# Patient Record
Sex: Female | Born: 1945
Health system: Southern US, Community
[De-identification: ages and names within clinical notes are randomized; demographics above are authoritative.]

## PROBLEM LIST (undated history)

## (undated) ENCOUNTER — Ambulatory Visit (HOSPITAL_COMMUNITY): Payer: Medicare Other | Source: Home / Self Care

## (undated) DIAGNOSIS — E7800 Pure hypercholesterolemia, unspecified: Secondary | ICD-10-CM

## (undated) DIAGNOSIS — T4145XA Adverse effect of unspecified anesthetic, initial encounter: Secondary | ICD-10-CM

## (undated) DIAGNOSIS — F329 Major depressive disorder, single episode, unspecified: Secondary | ICD-10-CM

## (undated) DIAGNOSIS — G4730 Sleep apnea, unspecified: Secondary | ICD-10-CM

## (undated) DIAGNOSIS — M1990 Unspecified osteoarthritis, unspecified site: Secondary | ICD-10-CM

## (undated) DIAGNOSIS — F32A Depression, unspecified: Secondary | ICD-10-CM

## (undated) DIAGNOSIS — R011 Cardiac murmur, unspecified: Secondary | ICD-10-CM

## (undated) DIAGNOSIS — K219 Gastro-esophageal reflux disease without esophagitis: Secondary | ICD-10-CM

## (undated) DIAGNOSIS — E119 Type 2 diabetes mellitus without complications: Secondary | ICD-10-CM

## (undated) DIAGNOSIS — I10 Essential (primary) hypertension: Secondary | ICD-10-CM

## (undated) DIAGNOSIS — F419 Anxiety disorder, unspecified: Secondary | ICD-10-CM

## (undated) DIAGNOSIS — I739 Peripheral vascular disease, unspecified: Secondary | ICD-10-CM

## (undated) HISTORY — PX: REDUCTION MAMMAPLASTY: SUR839

## (undated) HISTORY — PX: TUBAL LIGATION: SHX77

## (undated) HISTORY — PX: BUNIONECTOMY: SHX129

## (undated) HISTORY — PX: FOOT SURGERY: SHX648

---

## 1991-05-03 DIAGNOSIS — T8859XA Other complications of anesthesia, initial encounter: Secondary | ICD-10-CM

## 1991-05-03 HISTORY — DX: Other complications of anesthesia, initial encounter: T88.59XA

## 1998-02-02 ENCOUNTER — Ambulatory Visit (HOSPITAL_COMMUNITY): Admission: AD | Admit: 1998-02-02 | Discharge: 1998-02-02 | Payer: Self-pay | Admitting: Family Medicine

## 1998-02-12 ENCOUNTER — Ambulatory Visit (HOSPITAL_COMMUNITY): Admission: RE | Admit: 1998-02-12 | Discharge: 1998-02-12 | Payer: Self-pay | Admitting: Family Medicine

## 1998-05-28 ENCOUNTER — Other Ambulatory Visit: Admission: RE | Admit: 1998-05-28 | Discharge: 1998-05-28 | Payer: Self-pay | Admitting: Internal Medicine

## 1998-06-10 ENCOUNTER — Encounter: Payer: Self-pay | Admitting: Family Medicine

## 1998-06-10 ENCOUNTER — Ambulatory Visit (HOSPITAL_COMMUNITY): Admission: RE | Admit: 1998-06-10 | Discharge: 1998-06-10 | Payer: Self-pay | Admitting: Obstetrics & Gynecology

## 1998-08-31 ENCOUNTER — Other Ambulatory Visit: Admission: RE | Admit: 1998-08-31 | Discharge: 1998-08-31 | Payer: Self-pay | Admitting: Obstetrics and Gynecology

## 1999-06-07 ENCOUNTER — Encounter: Admission: RE | Admit: 1999-06-07 | Discharge: 1999-06-07 | Payer: Self-pay | Admitting: Family Medicine

## 1999-06-22 ENCOUNTER — Encounter: Payer: Self-pay | Admitting: Family Medicine

## 1999-06-22 ENCOUNTER — Ambulatory Visit (HOSPITAL_COMMUNITY): Admission: RE | Admit: 1999-06-22 | Discharge: 1999-06-22 | Payer: Self-pay | Admitting: Family Medicine

## 1999-09-21 ENCOUNTER — Other Ambulatory Visit: Admission: RE | Admit: 1999-09-21 | Discharge: 1999-09-21 | Payer: Self-pay | Admitting: Obstetrics and Gynecology

## 2000-05-04 ENCOUNTER — Encounter: Payer: Self-pay | Admitting: Obstetrics and Gynecology

## 2000-05-04 ENCOUNTER — Ambulatory Visit (HOSPITAL_COMMUNITY): Admission: RE | Admit: 2000-05-04 | Discharge: 2000-05-04 | Payer: Self-pay | Admitting: Obstetrics and Gynecology

## 2000-06-07 ENCOUNTER — Other Ambulatory Visit: Admission: RE | Admit: 2000-06-07 | Discharge: 2000-06-07 | Payer: Self-pay | Admitting: Plastic Surgery

## 2000-06-07 ENCOUNTER — Encounter (INDEPENDENT_AMBULATORY_CARE_PROVIDER_SITE_OTHER): Payer: Self-pay | Admitting: Specialist

## 2000-11-29 ENCOUNTER — Other Ambulatory Visit: Admission: RE | Admit: 2000-11-29 | Discharge: 2000-11-29 | Payer: Self-pay | Admitting: Obstetrics and Gynecology

## 2001-09-21 ENCOUNTER — Encounter: Payer: Self-pay | Admitting: Obstetrics and Gynecology

## 2001-09-21 ENCOUNTER — Ambulatory Visit (HOSPITAL_COMMUNITY): Admission: RE | Admit: 2001-09-21 | Discharge: 2001-09-21 | Payer: Self-pay | Admitting: Obstetrics and Gynecology

## 2002-01-01 ENCOUNTER — Other Ambulatory Visit: Admission: RE | Admit: 2002-01-01 | Discharge: 2002-01-01 | Payer: Self-pay | Admitting: Obstetrics and Gynecology

## 2002-09-23 ENCOUNTER — Ambulatory Visit (HOSPITAL_COMMUNITY): Admission: RE | Admit: 2002-09-23 | Discharge: 2002-09-23 | Payer: Self-pay | Admitting: Obstetrics and Gynecology

## 2002-09-23 ENCOUNTER — Encounter: Payer: Self-pay | Admitting: Obstetrics and Gynecology

## 2003-12-01 ENCOUNTER — Ambulatory Visit (HOSPITAL_COMMUNITY): Admission: RE | Admit: 2003-12-01 | Discharge: 2003-12-01 | Payer: Self-pay | Admitting: Family Medicine

## 2004-06-17 ENCOUNTER — Other Ambulatory Visit: Admission: RE | Admit: 2004-06-17 | Discharge: 2004-06-17 | Payer: Self-pay | Admitting: Obstetrics and Gynecology

## 2005-02-17 ENCOUNTER — Encounter: Admission: RE | Admit: 2005-02-17 | Discharge: 2005-02-17 | Payer: Self-pay | Admitting: Family Medicine

## 2005-06-08 ENCOUNTER — Other Ambulatory Visit: Admission: RE | Admit: 2005-06-08 | Discharge: 2005-06-08 | Payer: Self-pay | Admitting: Obstetrics and Gynecology

## 2005-08-31 ENCOUNTER — Encounter: Payer: Self-pay | Admitting: Family Medicine

## 2006-04-03 ENCOUNTER — Encounter: Admission: RE | Admit: 2006-04-03 | Discharge: 2006-04-03 | Payer: Self-pay | Admitting: Family Medicine

## 2006-05-12 ENCOUNTER — Ambulatory Visit: Payer: Self-pay | Admitting: Internal Medicine

## 2006-05-18 ENCOUNTER — Encounter (INDEPENDENT_AMBULATORY_CARE_PROVIDER_SITE_OTHER): Payer: Self-pay | Admitting: *Deleted

## 2006-05-18 ENCOUNTER — Ambulatory Visit: Payer: Self-pay | Admitting: Internal Medicine

## 2006-06-20 ENCOUNTER — Ambulatory Visit: Payer: Self-pay | Admitting: Internal Medicine

## 2007-04-11 ENCOUNTER — Encounter: Admission: RE | Admit: 2007-04-11 | Discharge: 2007-04-11 | Payer: Self-pay | Admitting: Family

## 2008-04-14 ENCOUNTER — Encounter: Admission: RE | Admit: 2008-04-14 | Discharge: 2008-04-14 | Payer: Self-pay | Admitting: Family Medicine

## 2009-05-18 ENCOUNTER — Encounter: Admission: RE | Admit: 2009-05-18 | Discharge: 2009-05-18 | Payer: Self-pay | Admitting: Family Medicine

## 2010-09-17 NOTE — Assessment & Plan Note (Signed)
HEALTHCARE                         GASTROENTEROLOGY OFFICE NOTE   REESE, SENK                       MRN:          [id number]  DATE:05/12/2006                            DOB:          1946-04-25    Stacey Santana is a very nice 65 year old white female who we saw in the  past for gastroesophageal reflux, esophageal stricture and colorectal  screening, and evaluation of diarrhea.  She is here today because of  diarrhea and leakage of stool.  She has been fully evaluated in [facility] in Urogynecology and diagnosed with pelvic floor dyssynergia.  She  also was diagnosed on the anal manometer to have anorectal muscle  separation, probably traumatic from the fourth-degree laceration during  her delivery.  She is currently undergoing physical therapy for pelvic  floor exercises and strengthening muscles.  She was fully evaluated by  Dr. Bernette Redbird and advised to undergo colonoscopy to rule out  any structural problems in her colon.  Her last colonoscopy in January  2000 showed minimal diverticulosis of the left colon, no polyps.  There  is no family history of colon cancer.  Her stools are mostly loose.  She  denies constipation.  She has mild incontinence with leakage of the  stool at times.  She had several episodes of rectal bleeding.   MEDICATIONS:  1. Avandamet 06/998 half tablet p.o. daily.  2. Zoloft 150 mg p.o. daily.  3. Toprol XL 25 mg p.o. daily.  4. Vesicare 5 mg p.o. daily.  5. Vitamin C.  6. Calcium.  7. Multivitamins.  8. Depakote 1000 mg p.o. nightly.  9. Lisinopril 40 mg p.o. daily.  10.Omeprazole 20 mg p.o. daily.   PAST MEDICAL HISTORY:  Significant for high blood pressure, diabetes,  hyperlipidemia, depression, allergies.   PAST SURGICAL HISTORY:  Breast surgery reduction.   FAMILY HISTORY:  Negative for colon cancer.  Diabetes in mother.   SOCIAL HISTORY:  Married with two children, college education.  She  is  an Charity fundraiser, works in Research officer, trade union.  She drinks socially, does not smoke.   REVIEW OF SYSTEMS:  Weight has been stable.  Eyeglasses.  Allergies.  Leakage of stool as described in history of present illness.   PHYSICAL EXAMINATION:  Blood pressure 126/80, pulse 74, weight 208  pounds.  She was alert, oriented, in no distress, very cooperative.  HEENT:  Sclerae is nonicteric.  Oral cavity was normal.  NECK:  Supple, no adenopathy.  LUNGS:  Clear to auscultation.  COR:  Quiet precordium, normal S1, normal S2.  ABDOMEN:  Soft, relaxed, with normoactive bowel sounds.  No tympany, no  distention, liver edge at the costal margin.  RECTAL:  Exam shows a normal perianal area.  Rectal tone was actually  adequate, perhaps a little relaxed, but she had a squeeze around my  finger.  Rectal ampulla felt normal.  There was some moderate amount of  semi-solid stool, which was heme-negative.   IMPRESSION:  A 65 year old white female with history of low-grade  diarrhea attributed to irritable bowel syndrome, known minimal  diverticulosis of the left colon  as per last colonoscopy 8 years ago.  She has new findings of pelvic dyssynergia and leakage of stool.  She  has also had several episodes of rectal bleeding.  Although her initial  interval for recall colonoscopy was 10 years, we will proceed with  colonoscopy at this time to rule out any structural lesions that might  have occurred since last colonoscopy.   PLAN:  1. We will schedule colonoscopy, routine colonoscopy prep.  2. Begin Levbid 0.375 mg as an antispasmodic.  3. Increase fiber in her diet.  4. No need for repeat upper endoscopy at this time, since the patient      remains asymptomatic.     Stacey Morton. [name], MD  Electronically Signed    DMB/MedQ  DD: 05/12/2006  DT: 05/13/2006  Job #: [id number]   cc:   Bernette Redbird, MD  Solon Palm, D.O.  Hal Morales, M.D.

## 2010-09-17 NOTE — Assessment & Plan Note (Signed)
[name]                         GASTROENTEROLOGY OFFICE NOTE   [name], [name]                       MRN:          [id number]  DATE:06/20/2006                            DOB:          05/06/1945    Stacey Santana is a 65 year old white female who has been evaluated for  urgency and diarrhea.  She had a colonoscopy on May 18, 2006 with  random biopsies of the colon to rule out collagenous colitis but her  biopsies were negative for microscopic colitis.  She has some  diverticulosis of the left colon and was advised to take psyllium, 1  tablespoon daily and Levbid 0.375 mg twice a day, which she has taken  and it is about 60% to 70% improved.  She is not having as much diarrhea  or urgency.  She has occasional gastroesophageal reflux for which she  takes omeprazole 20 mg a day.   Other medications:  1. Avandamet 06/998 half tablets p.o. b.i.d.  2. Zoloft 150 mg p.o. daily.  3. Toprol XL 25 mg p.o. daily.  4. Vesicare 5 mg p.o. daily.  5. Vitamin C.  6. Calcium.  7. Multiple vitamins.  8. Depakote 1000 mg p.o. nightly.  9. Lisinopril 40 mg p.o. nightly.  10.Omeprazole 20 mg p.o. daily.  11.Levbid 0.375 mg p.o. daily.  12.Lipitor 10 mg p.o. daily.   PHYSICAL EXAMINATION:  Blood pressure 118/80, pulse 72 and weight 208  pounds.  The patient was not examined.   IMPRESSION:  33. A 65 year old white female with irritable bowel syndrome/diarrhea      currently under reasonable control on anti-spasmodic.  2. Mild diverticulosis of the left colon.   PLAN:  1. Continue Levbid.  Prescription given for 90 day supply.  2. Continue high fiber diet.  3. Add psyllium 1 tablespoon daily.  4. I will see her on an p.r.n. basis.     [name]. [name], MD  Electronically Signed    DMB/MedQ  DD: 06/20/2006  DT: 06/21/2006  Job #: [id number]   cc:   Ms. Rema Fendt, FNP

## 2010-11-05 ENCOUNTER — Ambulatory Visit: Payer: BC Managed Care – PPO | Admitting: Physical Therapy

## 2010-11-05 ENCOUNTER — Ambulatory Visit: Payer: BC Managed Care – PPO | Attending: Family Medicine | Admitting: Physical Therapy

## 2010-11-05 DIAGNOSIS — M25519 Pain in unspecified shoulder: Secondary | ICD-10-CM | POA: Insufficient documentation

## 2010-11-05 DIAGNOSIS — IMO0001 Reserved for inherently not codable concepts without codable children: Secondary | ICD-10-CM | POA: Insufficient documentation

## 2010-11-05 DIAGNOSIS — R5381 Other malaise: Secondary | ICD-10-CM | POA: Insufficient documentation

## 2010-11-08 ENCOUNTER — Ambulatory Visit: Payer: BC Managed Care – PPO | Admitting: Physical Therapy

## 2010-11-10 ENCOUNTER — Ambulatory Visit: Payer: BC Managed Care – PPO | Admitting: Physical Therapy

## 2010-11-12 ENCOUNTER — Encounter: Payer: BC Managed Care – PPO | Admitting: Physical Therapy

## 2010-11-22 ENCOUNTER — Ambulatory Visit: Payer: BC Managed Care – PPO | Admitting: Physical Therapy

## 2010-11-24 ENCOUNTER — Ambulatory Visit: Payer: BC Managed Care – PPO | Admitting: Physical Therapy

## 2010-11-26 ENCOUNTER — Ambulatory Visit: Payer: BC Managed Care – PPO | Admitting: Physical Therapy

## 2010-11-29 ENCOUNTER — Ambulatory Visit: Payer: BC Managed Care – PPO

## 2010-12-03 ENCOUNTER — Ambulatory Visit: Payer: BC Managed Care – PPO | Attending: Family Medicine | Admitting: Physical Therapy

## 2010-12-03 DIAGNOSIS — R5381 Other malaise: Secondary | ICD-10-CM | POA: Insufficient documentation

## 2010-12-03 DIAGNOSIS — IMO0001 Reserved for inherently not codable concepts without codable children: Secondary | ICD-10-CM | POA: Insufficient documentation

## 2010-12-03 DIAGNOSIS — M25519 Pain in unspecified shoulder: Secondary | ICD-10-CM | POA: Insufficient documentation

## 2010-12-06 ENCOUNTER — Ambulatory Visit: Payer: BC Managed Care – PPO | Admitting: Physical Therapy

## 2010-12-08 ENCOUNTER — Encounter: Payer: BC Managed Care – PPO | Admitting: Physical Therapy

## 2010-12-13 ENCOUNTER — Ambulatory Visit: Payer: BC Managed Care – PPO

## 2010-12-15 ENCOUNTER — Ambulatory Visit: Payer: BC Managed Care – PPO

## 2010-12-16 ENCOUNTER — Encounter: Payer: BC Managed Care – PPO | Admitting: Physical Therapy

## 2010-12-30 ENCOUNTER — Encounter: Payer: BC Managed Care – PPO | Admitting: Physical Therapy

## 2011-10-14 ENCOUNTER — Other Ambulatory Visit: Payer: Self-pay | Admitting: Family Medicine

## 2011-10-14 DIAGNOSIS — Z1231 Encounter for screening mammogram for malignant neoplasm of breast: Secondary | ICD-10-CM

## 2011-11-04 ENCOUNTER — Ambulatory Visit: Payer: BC Managed Care – PPO

## 2011-11-11 ENCOUNTER — Ambulatory Visit
Admission: RE | Admit: 2011-11-11 | Discharge: 2011-11-11 | Disposition: A | Discharge status: Home / Self Care | Payer: BC Managed Care – PPO | Source: Ambulatory Visit | Attending: Family Medicine | Admitting: Family Medicine

## 2011-11-11 DIAGNOSIS — Z1231 Encounter for screening mammogram for malignant neoplasm of breast: Secondary | ICD-10-CM

## 2011-11-28 ENCOUNTER — Encounter: Payer: Self-pay | Admitting: Obstetrics and Gynecology

## 2012-06-19 ENCOUNTER — Ambulatory Visit: Payer: BC Managed Care – PPO | Attending: Family Medicine

## 2012-06-19 DIAGNOSIS — M25559 Pain in unspecified hip: Secondary | ICD-10-CM | POA: Insufficient documentation

## 2012-06-19 DIAGNOSIS — IMO0001 Reserved for inherently not codable concepts without codable children: Secondary | ICD-10-CM | POA: Insufficient documentation

## 2012-06-19 DIAGNOSIS — R269 Unspecified abnormalities of gait and mobility: Secondary | ICD-10-CM | POA: Insufficient documentation

## 2012-06-19 DIAGNOSIS — R5381 Other malaise: Secondary | ICD-10-CM | POA: Insufficient documentation

## 2012-06-21 ENCOUNTER — Ambulatory Visit: Payer: BC Managed Care – PPO | Admitting: Physical Therapy

## 2012-06-26 ENCOUNTER — Ambulatory Visit: Payer: BC Managed Care – PPO | Admitting: Physical Therapy

## 2012-06-28 ENCOUNTER — Ambulatory Visit: Payer: BC Managed Care – PPO

## 2012-07-02 ENCOUNTER — Ambulatory Visit: Payer: BC Managed Care – PPO | Attending: Family Medicine

## 2012-07-02 DIAGNOSIS — R5381 Other malaise: Secondary | ICD-10-CM | POA: Insufficient documentation

## 2012-07-02 DIAGNOSIS — R269 Unspecified abnormalities of gait and mobility: Secondary | ICD-10-CM | POA: Insufficient documentation

## 2012-07-02 DIAGNOSIS — IMO0001 Reserved for inherently not codable concepts without codable children: Secondary | ICD-10-CM | POA: Insufficient documentation

## 2012-07-02 DIAGNOSIS — M25559 Pain in unspecified hip: Secondary | ICD-10-CM | POA: Insufficient documentation

## 2012-07-10 ENCOUNTER — Ambulatory Visit: Payer: BC Managed Care – PPO | Admitting: Physical Therapy

## 2012-07-13 ENCOUNTER — Ambulatory Visit: Payer: BC Managed Care – PPO | Admitting: Physical Therapy

## 2012-07-17 ENCOUNTER — Ambulatory Visit: Payer: BC Managed Care – PPO | Admitting: Physical Therapy

## 2012-07-20 ENCOUNTER — Ambulatory Visit: Payer: BC Managed Care – PPO

## 2012-07-24 ENCOUNTER — Ambulatory Visit: Payer: BC Managed Care – PPO | Admitting: Physical Therapy

## 2012-07-27 ENCOUNTER — Ambulatory Visit: Payer: BC Managed Care – PPO | Admitting: Physical Therapy

## 2012-07-31 ENCOUNTER — Ambulatory Visit: Payer: BC Managed Care – PPO | Attending: Family Medicine

## 2012-07-31 DIAGNOSIS — R269 Unspecified abnormalities of gait and mobility: Secondary | ICD-10-CM | POA: Insufficient documentation

## 2012-07-31 DIAGNOSIS — R5381 Other malaise: Secondary | ICD-10-CM | POA: Insufficient documentation

## 2012-07-31 DIAGNOSIS — IMO0001 Reserved for inherently not codable concepts without codable children: Secondary | ICD-10-CM | POA: Insufficient documentation

## 2012-07-31 DIAGNOSIS — M25559 Pain in unspecified hip: Secondary | ICD-10-CM | POA: Insufficient documentation

## 2012-08-03 ENCOUNTER — Ambulatory Visit: Payer: BC Managed Care – PPO | Admitting: Physical Therapy

## 2012-08-07 ENCOUNTER — Ambulatory Visit: Payer: BC Managed Care – PPO | Admitting: Physical Therapy

## 2012-08-10 ENCOUNTER — Ambulatory Visit: Payer: BC Managed Care – PPO | Admitting: Physical Therapy

## 2013-01-14 ENCOUNTER — Other Ambulatory Visit: Payer: Self-pay

## 2013-01-14 DIAGNOSIS — Z1231 Encounter for screening mammogram for malignant neoplasm of breast: Secondary | ICD-10-CM

## 2013-01-31 ENCOUNTER — Ambulatory Visit
Admission: RE | Admit: 2013-01-31 | Discharge: 2013-01-31 | Disposition: A | Discharge status: Home / Self Care | Payer: BC Managed Care – PPO | Source: Ambulatory Visit

## 2013-01-31 DIAGNOSIS — Z1231 Encounter for screening mammogram for malignant neoplasm of breast: Secondary | ICD-10-CM

## 2013-10-09 ENCOUNTER — Other Ambulatory Visit: Payer: Self-pay | Admitting: Urology

## 2013-10-09 DIAGNOSIS — N281 Cyst of kidney, acquired: Secondary | ICD-10-CM

## 2013-10-17 ENCOUNTER — Ambulatory Visit (HOSPITAL_COMMUNITY)
Admission: RE | Admit: 2013-10-17 | Discharge: 2013-10-17 | Disposition: A | Discharge status: Home / Self Care | Payer: BC Managed Care – PPO | Source: Ambulatory Visit | Attending: Urology | Admitting: Urology

## 2013-10-17 DIAGNOSIS — N281 Cyst of kidney, acquired: Secondary | ICD-10-CM | POA: Insufficient documentation

## 2013-10-17 DIAGNOSIS — N289 Disorder of kidney and ureter, unspecified: Secondary | ICD-10-CM | POA: Insufficient documentation

## 2013-10-17 MED ORDER — GADOBENATE DIMEGLUMINE 529 MG/ML IV SOLN
18.0000 mL | Freq: Once | INTRAVENOUS | Status: AC | PRN
  Administered 2013-10-17: 18 mL via INTRAVENOUS

## 2014-04-30 ENCOUNTER — Other Ambulatory Visit: Payer: Self-pay

## 2014-04-30 DIAGNOSIS — Z1231 Encounter for screening mammogram for malignant neoplasm of breast: Secondary | ICD-10-CM

## 2014-05-26 ENCOUNTER — Ambulatory Visit: Payer: BC Managed Care – PPO

## 2014-06-02 ENCOUNTER — Ambulatory Visit
Admission: RE | Admit: 2014-06-02 | Discharge: 2014-06-02 | Disposition: A | Discharge status: Home / Self Care | Payer: BC Managed Care – PPO | Source: Ambulatory Visit

## 2014-06-02 DIAGNOSIS — Z1231 Encounter for screening mammogram for malignant neoplasm of breast: Secondary | ICD-10-CM

## 2014-09-03 ENCOUNTER — Other Ambulatory Visit (HOSPITAL_COMMUNITY): Payer: Self-pay | Admitting: Urology

## 2014-09-03 DIAGNOSIS — N281 Cyst of kidney, acquired: Secondary | ICD-10-CM

## 2014-10-10 ENCOUNTER — Ambulatory Visit (HOSPITAL_COMMUNITY)
Admission: RE | Admit: 2014-10-10 | Discharge: 2014-10-10 | Disposition: A | Discharge status: Home / Self Care | Payer: BC Managed Care – PPO | Source: Ambulatory Visit | Attending: Urology | Admitting: Urology

## 2014-10-10 DIAGNOSIS — N289 Disorder of kidney and ureter, unspecified: Secondary | ICD-10-CM | POA: Diagnosis not present

## 2014-10-10 DIAGNOSIS — K760 Fatty (change of) liver, not elsewhere classified: Secondary | ICD-10-CM | POA: Diagnosis not present

## 2014-10-10 DIAGNOSIS — N281 Cyst of kidney, acquired: Secondary | ICD-10-CM

## 2014-10-10 LAB — POCT I-STAT CREATININE: Creatinine, Ser: 0.8 mg/dL (ref 0.44–1.00)

## 2014-10-10 MED ORDER — GADOBENATE DIMEGLUMINE 529 MG/ML IV SOLN
19.0000 mL | Freq: Once | INTRAVENOUS | Status: AC | PRN
  Administered 2014-10-10: 19 mL via INTRAVENOUS

## 2015-06-18 ENCOUNTER — Other Ambulatory Visit: Payer: Self-pay | Admitting: Family Medicine

## 2015-06-18 ENCOUNTER — Other Ambulatory Visit (HOSPITAL_COMMUNITY)
Admission: RE | Admit: 2015-06-18 | Discharge: 2015-06-18 | Disposition: A | Discharge status: Home / Self Care | Payer: Medicare Other | Source: Ambulatory Visit | Attending: Family Medicine | Admitting: Family Medicine

## 2015-06-18 DIAGNOSIS — Z124 Encounter for screening for malignant neoplasm of cervix: Secondary | ICD-10-CM | POA: Diagnosis not present

## 2015-06-18 DIAGNOSIS — M85852 Other specified disorders of bone density and structure, left thigh: Secondary | ICD-10-CM | POA: Diagnosis not present

## 2015-06-18 DIAGNOSIS — Z1211 Encounter for screening for malignant neoplasm of colon: Secondary | ICD-10-CM | POA: Diagnosis not present

## 2015-06-18 DIAGNOSIS — Z23 Encounter for immunization: Secondary | ICD-10-CM | POA: Diagnosis not present

## 2015-06-18 DIAGNOSIS — Z0000 Encounter for general adult medical examination without abnormal findings: Secondary | ICD-10-CM | POA: Diagnosis not present

## 2015-06-18 DIAGNOSIS — L309 Dermatitis, unspecified: Secondary | ICD-10-CM | POA: Diagnosis not present

## 2015-06-18 DIAGNOSIS — Z7189 Other specified counseling: Secondary | ICD-10-CM | POA: Diagnosis not present

## 2015-06-22 LAB — CYTOLOGY - PAP

## 2015-06-24 DIAGNOSIS — E785 Hyperlipidemia, unspecified: Secondary | ICD-10-CM | POA: Diagnosis not present

## 2015-06-24 DIAGNOSIS — F339 Major depressive disorder, recurrent, unspecified: Secondary | ICD-10-CM | POA: Diagnosis not present

## 2015-06-24 DIAGNOSIS — M85852 Other specified disorders of bone density and structure, left thigh: Secondary | ICD-10-CM | POA: Diagnosis not present

## 2015-06-24 DIAGNOSIS — Z7984 Long term (current) use of oral hypoglycemic drugs: Secondary | ICD-10-CM | POA: Diagnosis not present

## 2015-06-24 DIAGNOSIS — Z1211 Encounter for screening for malignant neoplasm of colon: Secondary | ICD-10-CM | POA: Diagnosis not present

## 2015-06-24 DIAGNOSIS — Z0000 Encounter for general adult medical examination without abnormal findings: Secondary | ICD-10-CM | POA: Diagnosis not present

## 2015-06-24 DIAGNOSIS — I10 Essential (primary) hypertension: Secondary | ICD-10-CM | POA: Diagnosis not present

## 2015-06-24 DIAGNOSIS — E119 Type 2 diabetes mellitus without complications: Secondary | ICD-10-CM | POA: Diagnosis not present

## 2015-06-24 DIAGNOSIS — N3941 Urge incontinence: Secondary | ICD-10-CM | POA: Diagnosis not present

## 2015-06-26 DIAGNOSIS — L309 Dermatitis, unspecified: Secondary | ICD-10-CM | POA: Diagnosis not present

## 2015-06-26 DIAGNOSIS — M2190 Unspecified acquired deformity of unspecified limb: Secondary | ICD-10-CM | POA: Diagnosis not present

## 2015-06-26 DIAGNOSIS — Z7984 Long term (current) use of oral hypoglycemic drugs: Secondary | ICD-10-CM | POA: Diagnosis not present

## 2015-06-26 DIAGNOSIS — F339 Major depressive disorder, recurrent, unspecified: Secondary | ICD-10-CM | POA: Diagnosis not present

## 2015-06-26 DIAGNOSIS — G4733 Obstructive sleep apnea (adult) (pediatric): Secondary | ICD-10-CM | POA: Diagnosis not present

## 2015-06-26 DIAGNOSIS — I10 Essential (primary) hypertension: Secondary | ICD-10-CM | POA: Diagnosis not present

## 2015-06-26 DIAGNOSIS — N3941 Urge incontinence: Secondary | ICD-10-CM | POA: Diagnosis not present

## 2015-06-26 DIAGNOSIS — K219 Gastro-esophageal reflux disease without esophagitis: Secondary | ICD-10-CM | POA: Diagnosis not present

## 2015-06-26 DIAGNOSIS — E785 Hyperlipidemia, unspecified: Secondary | ICD-10-CM | POA: Diagnosis not present

## 2015-06-26 DIAGNOSIS — E119 Type 2 diabetes mellitus without complications: Secondary | ICD-10-CM | POA: Diagnosis not present

## 2015-07-03 ENCOUNTER — Other Ambulatory Visit: Payer: Self-pay

## 2015-07-03 DIAGNOSIS — Z1231 Encounter for screening mammogram for malignant neoplasm of breast: Secondary | ICD-10-CM

## 2015-07-28 DIAGNOSIS — Z1211 Encounter for screening for malignant neoplasm of colon: Secondary | ICD-10-CM | POA: Diagnosis not present

## 2015-07-29 DIAGNOSIS — M8589 Other specified disorders of bone density and structure, multiple sites: Secondary | ICD-10-CM | POA: Diagnosis not present

## 2015-07-29 DIAGNOSIS — M859 Disorder of bone density and structure, unspecified: Secondary | ICD-10-CM | POA: Diagnosis not present

## 2015-08-04 DIAGNOSIS — L84 Corns and callosities: Secondary | ICD-10-CM | POA: Diagnosis not present

## 2015-08-04 DIAGNOSIS — M2041 Other hammer toe(s) (acquired), right foot: Secondary | ICD-10-CM | POA: Diagnosis not present

## 2015-08-04 DIAGNOSIS — E119 Type 2 diabetes mellitus without complications: Secondary | ICD-10-CM | POA: Diagnosis not present

## 2015-08-04 DIAGNOSIS — M2042 Other hammer toe(s) (acquired), left foot: Secondary | ICD-10-CM | POA: Diagnosis not present

## 2015-08-12 ENCOUNTER — Ambulatory Visit: Payer: BC Managed Care – PPO

## 2015-08-12 DIAGNOSIS — F3341 Major depressive disorder, recurrent, in partial remission: Secondary | ICD-10-CM | POA: Diagnosis not present

## 2015-08-18 ENCOUNTER — Ambulatory Visit
Admission: RE | Admit: 2015-08-18 | Discharge: 2015-08-18 | Disposition: A | Discharge status: Home / Self Care | Payer: Medicare Other | Source: Ambulatory Visit

## 2015-08-18 DIAGNOSIS — Z1231 Encounter for screening mammogram for malignant neoplasm of breast: Secondary | ICD-10-CM | POA: Diagnosis not present

## 2015-09-14 DIAGNOSIS — M24574 Contracture, right foot: Secondary | ICD-10-CM | POA: Diagnosis not present

## 2015-12-10 DIAGNOSIS — F3341 Major depressive disorder, recurrent, in partial remission: Secondary | ICD-10-CM | POA: Diagnosis not present

## 2015-12-24 DIAGNOSIS — G4733 Obstructive sleep apnea (adult) (pediatric): Secondary | ICD-10-CM | POA: Diagnosis not present

## 2015-12-24 DIAGNOSIS — L309 Dermatitis, unspecified: Secondary | ICD-10-CM | POA: Diagnosis not present

## 2015-12-24 DIAGNOSIS — M2190 Unspecified acquired deformity of unspecified limb: Secondary | ICD-10-CM | POA: Diagnosis not present

## 2015-12-24 DIAGNOSIS — K219 Gastro-esophageal reflux disease without esophagitis: Secondary | ICD-10-CM | POA: Diagnosis not present

## 2015-12-24 DIAGNOSIS — Z7189 Other specified counseling: Secondary | ICD-10-CM | POA: Diagnosis not present

## 2015-12-24 DIAGNOSIS — E119 Type 2 diabetes mellitus without complications: Secondary | ICD-10-CM | POA: Diagnosis not present

## 2015-12-24 DIAGNOSIS — M85852 Other specified disorders of bone density and structure, left thigh: Secondary | ICD-10-CM | POA: Diagnosis not present

## 2015-12-24 DIAGNOSIS — I10 Essential (primary) hypertension: Secondary | ICD-10-CM | POA: Diagnosis not present

## 2015-12-24 DIAGNOSIS — Z0000 Encounter for general adult medical examination without abnormal findings: Secondary | ICD-10-CM | POA: Diagnosis not present

## 2015-12-24 DIAGNOSIS — E785 Hyperlipidemia, unspecified: Secondary | ICD-10-CM | POA: Diagnosis not present

## 2015-12-24 DIAGNOSIS — F339 Major depressive disorder, recurrent, unspecified: Secondary | ICD-10-CM | POA: Diagnosis not present

## 2015-12-24 DIAGNOSIS — N3941 Urge incontinence: Secondary | ICD-10-CM | POA: Diagnosis not present

## 2015-12-25 DIAGNOSIS — E119 Type 2 diabetes mellitus without complications: Secondary | ICD-10-CM | POA: Diagnosis not present

## 2015-12-25 DIAGNOSIS — Z23 Encounter for immunization: Secondary | ICD-10-CM | POA: Diagnosis not present

## 2015-12-25 DIAGNOSIS — I10 Essential (primary) hypertension: Secondary | ICD-10-CM | POA: Diagnosis not present

## 2015-12-25 DIAGNOSIS — N3941 Urge incontinence: Secondary | ICD-10-CM | POA: Diagnosis not present

## 2015-12-25 DIAGNOSIS — Z111 Encounter for screening for respiratory tuberculosis: Secondary | ICD-10-CM | POA: Diagnosis not present

## 2015-12-25 DIAGNOSIS — F339 Major depressive disorder, recurrent, unspecified: Secondary | ICD-10-CM | POA: Diagnosis not present

## 2015-12-25 DIAGNOSIS — K219 Gastro-esophageal reflux disease without esophagitis: Secondary | ICD-10-CM | POA: Diagnosis not present

## 2015-12-25 DIAGNOSIS — E559 Vitamin D deficiency, unspecified: Secondary | ICD-10-CM | POA: Diagnosis not present

## 2015-12-25 DIAGNOSIS — G4733 Obstructive sleep apnea (adult) (pediatric): Secondary | ICD-10-CM | POA: Diagnosis not present

## 2015-12-25 DIAGNOSIS — N390 Urinary tract infection, site not specified: Secondary | ICD-10-CM | POA: Diagnosis not present

## 2015-12-25 DIAGNOSIS — E785 Hyperlipidemia, unspecified: Secondary | ICD-10-CM | POA: Diagnosis not present

## 2015-12-25 DIAGNOSIS — B372 Candidiasis of skin and nail: Secondary | ICD-10-CM | POA: Diagnosis not present

## 2015-12-30 DIAGNOSIS — E119 Type 2 diabetes mellitus without complications: Secondary | ICD-10-CM | POA: Diagnosis not present

## 2015-12-30 DIAGNOSIS — H2513 Age-related nuclear cataract, bilateral: Secondary | ICD-10-CM | POA: Diagnosis not present

## 2016-01-28 DIAGNOSIS — G4733 Obstructive sleep apnea (adult) (pediatric): Secondary | ICD-10-CM | POA: Diagnosis not present

## 2016-03-23 ENCOUNTER — Encounter: Payer: Self-pay | Admitting: Gastroenterology

## 2016-04-13 DIAGNOSIS — J209 Acute bronchitis, unspecified: Secondary | ICD-10-CM | POA: Diagnosis not present

## 2016-04-18 DIAGNOSIS — N3941 Urge incontinence: Secondary | ICD-10-CM | POA: Diagnosis not present

## 2016-06-20 DIAGNOSIS — Z7984 Long term (current) use of oral hypoglycemic drugs: Secondary | ICD-10-CM | POA: Diagnosis not present

## 2016-06-20 DIAGNOSIS — I10 Essential (primary) hypertension: Secondary | ICD-10-CM | POA: Diagnosis not present

## 2016-06-20 DIAGNOSIS — E559 Vitamin D deficiency, unspecified: Secondary | ICD-10-CM | POA: Diagnosis not present

## 2016-06-20 DIAGNOSIS — E785 Hyperlipidemia, unspecified: Secondary | ICD-10-CM | POA: Diagnosis not present

## 2016-06-20 DIAGNOSIS — E119 Type 2 diabetes mellitus without complications: Secondary | ICD-10-CM | POA: Diagnosis not present

## 2016-06-21 DIAGNOSIS — Z1211 Encounter for screening for malignant neoplasm of colon: Secondary | ICD-10-CM | POA: Diagnosis not present

## 2016-06-21 DIAGNOSIS — E785 Hyperlipidemia, unspecified: Secondary | ICD-10-CM | POA: Diagnosis not present

## 2016-06-21 DIAGNOSIS — Z0000 Encounter for general adult medical examination without abnormal findings: Secondary | ICD-10-CM | POA: Diagnosis not present

## 2016-06-21 DIAGNOSIS — K219 Gastro-esophageal reflux disease without esophagitis: Secondary | ICD-10-CM | POA: Diagnosis not present

## 2016-06-21 DIAGNOSIS — N3941 Urge incontinence: Secondary | ICD-10-CM | POA: Diagnosis not present

## 2016-06-21 DIAGNOSIS — G4733 Obstructive sleep apnea (adult) (pediatric): Secondary | ICD-10-CM | POA: Diagnosis not present

## 2016-06-21 DIAGNOSIS — Q619 Cystic kidney disease, unspecified: Secondary | ICD-10-CM | POA: Diagnosis not present

## 2016-06-21 DIAGNOSIS — E559 Vitamin D deficiency, unspecified: Secondary | ICD-10-CM | POA: Diagnosis not present

## 2016-06-21 DIAGNOSIS — F3342 Major depressive disorder, recurrent, in full remission: Secondary | ICD-10-CM | POA: Diagnosis not present

## 2016-06-21 DIAGNOSIS — I10 Essential (primary) hypertension: Secondary | ICD-10-CM | POA: Diagnosis not present

## 2016-06-21 DIAGNOSIS — E119 Type 2 diabetes mellitus without complications: Secondary | ICD-10-CM | POA: Diagnosis not present

## 2016-06-21 DIAGNOSIS — Z7984 Long term (current) use of oral hypoglycemic drugs: Secondary | ICD-10-CM | POA: Diagnosis not present

## 2016-06-22 DIAGNOSIS — F3341 Major depressive disorder, recurrent, in partial remission: Secondary | ICD-10-CM | POA: Diagnosis not present

## 2016-07-26 ENCOUNTER — Encounter: Payer: Self-pay | Admitting: Family Medicine

## 2016-08-03 DIAGNOSIS — R102 Pelvic and perineal pain: Secondary | ICD-10-CM | POA: Diagnosis not present

## 2016-08-03 DIAGNOSIS — R3982 Chronic bladder pain: Secondary | ICD-10-CM | POA: Diagnosis not present

## 2016-08-11 DIAGNOSIS — R350 Frequency of micturition: Secondary | ICD-10-CM | POA: Diagnosis not present

## 2016-08-25 DIAGNOSIS — M791 Myalgia: Secondary | ICD-10-CM | POA: Diagnosis not present

## 2016-08-25 DIAGNOSIS — R600 Localized edema: Secondary | ICD-10-CM | POA: Diagnosis not present

## 2016-08-25 DIAGNOSIS — N3289 Other specified disorders of bladder: Secondary | ICD-10-CM | POA: Diagnosis not present

## 2016-08-30 DIAGNOSIS — F3341 Major depressive disorder, recurrent, in partial remission: Secondary | ICD-10-CM | POA: Diagnosis not present

## 2016-08-31 DIAGNOSIS — N3941 Urge incontinence: Secondary | ICD-10-CM | POA: Diagnosis not present

## 2016-08-31 DIAGNOSIS — I10 Essential (primary) hypertension: Secondary | ICD-10-CM | POA: Diagnosis not present

## 2016-08-31 DIAGNOSIS — M353 Polymyalgia rheumatica: Secondary | ICD-10-CM | POA: Diagnosis not present

## 2016-08-31 DIAGNOSIS — R600 Localized edema: Secondary | ICD-10-CM | POA: Diagnosis not present

## 2016-08-31 DIAGNOSIS — Z7984 Long term (current) use of oral hypoglycemic drugs: Secondary | ICD-10-CM | POA: Diagnosis not present

## 2016-08-31 DIAGNOSIS — R5383 Other fatigue: Secondary | ICD-10-CM | POA: Diagnosis not present

## 2016-08-31 DIAGNOSIS — E785 Hyperlipidemia, unspecified: Secondary | ICD-10-CM | POA: Diagnosis not present

## 2016-08-31 DIAGNOSIS — E119 Type 2 diabetes mellitus without complications: Secondary | ICD-10-CM | POA: Diagnosis not present

## 2016-08-31 DIAGNOSIS — Z1211 Encounter for screening for malignant neoplasm of colon: Secondary | ICD-10-CM | POA: Diagnosis not present

## 2016-08-31 DIAGNOSIS — M791 Myalgia: Secondary | ICD-10-CM | POA: Diagnosis not present

## 2016-09-19 DIAGNOSIS — N3941 Urge incontinence: Secondary | ICD-10-CM | POA: Diagnosis not present

## 2016-09-19 DIAGNOSIS — I10 Essential (primary) hypertension: Secondary | ICD-10-CM | POA: Diagnosis not present

## 2016-09-19 DIAGNOSIS — M25512 Pain in left shoulder: Secondary | ICD-10-CM | POA: Diagnosis not present

## 2016-09-21 ENCOUNTER — Ambulatory Visit (INDEPENDENT_AMBULATORY_CARE_PROVIDER_SITE_OTHER): Payer: Medicare Other

## 2016-09-21 ENCOUNTER — Encounter: Payer: Self-pay | Admitting: Podiatry

## 2016-09-21 ENCOUNTER — Ambulatory Visit (INDEPENDENT_AMBULATORY_CARE_PROVIDER_SITE_OTHER): Payer: Medicare Other | Admitting: Podiatry

## 2016-09-21 DIAGNOSIS — M2041 Other hammer toe(s) (acquired), right foot: Secondary | ICD-10-CM

## 2016-09-21 DIAGNOSIS — M2042 Other hammer toe(s) (acquired), left foot: Secondary | ICD-10-CM | POA: Diagnosis not present

## 2016-09-21 NOTE — Progress Notes (Signed)
   Subjective:    Patient ID: Stacey Santana, female    DOB: Apr 15, 1946, 71 y.o.   MRN: [id number]  HPI Chief Complaint  Patient presents with  . Toe Pain    2nd and 3rd toe right - aching for several months, painful to walk, certain shoes rub, interested in surgery      Review of Systems  Constitutional: Positive for diaphoresis.  Endocrine: Positive for heat intolerance.  Genitourinary: Positive for frequency.  Musculoskeletal: Positive for arthralgias and gait problem.  Neurological: Positive for light-headedness.  All other systems reviewed and are negative.      Objective:   Physical Exam        Assessment & Plan:

## 2016-09-21 NOTE — Patient Instructions (Signed)

## 2016-09-22 DIAGNOSIS — M19012 Primary osteoarthritis, left shoulder: Secondary | ICD-10-CM | POA: Diagnosis not present

## 2016-09-24 NOTE — Progress Notes (Signed)
Subjective:    Patient ID: Stacey Santana, female   DOB: 71 y.o.   MRN: [id number]   HPI patient states she's having a lot of pain in the second third toes of her right foot that are rubbing against shoes and she's tried wider shoes padding the area without relief of symptoms    Review of Systems  All other systems reviewed and are negative.       Objective:  Physical Exam  Cardiovascular: Intact distal pulses.   Musculoskeletal: Normal range of motion.  Neurological: She is alert.  Skin: Skin is warm.  Nursing note and vitals reviewed.  neurovascular status intact muscle strength was adequate range of motion within normal limits with patient found to have elevation and rigid contracture digits 23 right with redness on top of the toes and pain when palpated. 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 She wants to get this done and is motivated wants to do consent form today and I allowed her to read the consent form today going over with her all possible complications as listed and the consent form and the fact there is no guarantee as to the position with go to place these toes. Patient stands total recovery will take approximate 6 months and she will have pins in her toes for approximate 5 weeks. Patient is scheduled for outpatient surgery at this time signs consent form and is encouraged to call with questions

## 2016-09-27 ENCOUNTER — Encounter: Payer: Self-pay | Admitting: Podiatry

## 2016-09-27 DIAGNOSIS — M2041 Other hammer toe(s) (acquired), right foot: Secondary | ICD-10-CM | POA: Diagnosis not present

## 2016-09-27 DIAGNOSIS — E7800 Pure hypercholesterolemia, unspecified: Secondary | ICD-10-CM | POA: Diagnosis not present

## 2016-09-28 ENCOUNTER — Telehealth: Payer: Self-pay | Admitting: *Deleted

## 2016-09-28 MED ORDER — PROMETHAZINE HCL 25 MG PO TABS: ORAL_TABLET | ORAL | 0 refills | Status: DC

## 2016-09-28 NOTE — Telephone Encounter (Addendum)
Pt states the pain medication is making her nauseous and dizzy, and may be okay if she had something for the nausea. I told pt Dr. Josephina Shih Phenergan for the nausea. I told pt to take the phenergan about 30 minutes prior to the pain medication and to take the pain medication with a light non-greasy snack, to rest especially if the pain medication made dizzy and not to be up on her feet more than 15 minutes/hour. Pt states understanding. 09/29/2016-Pt states she is having to take the vicodin and the phenergan is helping, but needs a refill. I spoke with pt and asked the sensation she is having and she states a burning pain. I told pt that sometimes changing the sensation in the surgery foot is enough to relieve pain. I told pt to remove the boot, open-ended sock, and ace wrap only, then elevate the foot for 15 minutes and then place foot at hip level and rewrap the ace looser, replace the open-ended sock and the boot. I told pt I would call in the phenergan.

## 2016-09-29 MED ORDER — PROMETHAZINE HCL 25 MG PO TABS: ORAL_TABLET | ORAL | 0 refills | Status: DC

## 2016-09-30 NOTE — Progress Notes (Signed)
DOS 05.29.2018 Fusion with Pin 2,3 toes right.

## 2016-10-05 ENCOUNTER — Ambulatory Visit (INDEPENDENT_AMBULATORY_CARE_PROVIDER_SITE_OTHER): Payer: Medicare Other

## 2016-10-05 ENCOUNTER — Ambulatory Visit (INDEPENDENT_AMBULATORY_CARE_PROVIDER_SITE_OTHER): Payer: Medicare Other | Admitting: Podiatry

## 2016-10-05 ENCOUNTER — Encounter: Payer: Self-pay | Admitting: Podiatry

## 2016-10-05 DIAGNOSIS — M2041 Other hammer toe(s) (acquired), right foot: Secondary | ICD-10-CM

## 2016-10-05 NOTE — Progress Notes (Signed)
Subjective:    Patient ID: Stacey Santana, female   DOB: 70 y.o.   MRN: [id number]   HPI patient states that she's doing very well with surgery    ROS      Objective:  Physical Exam Neurovascular status intact negative Homans sign was noted with patient found to have good pin alignment with excellent clinical appearance of second and third digits right     Assessment:    Doing well post fusion digits 23 right     Plan:   X-rays reviewed sterile dressings reapplied and continue using his next 2 weeks and reappoint at that time for stitch removal and further x-ray evaluation.  X-rays indicate that we had up with the third toe and a very difficult position due to the severe preoperative condition but it is doing very very well as far as the postoperative process goes clinical

## 2016-10-18 DIAGNOSIS — N3281 Overactive bladder: Secondary | ICD-10-CM | POA: Diagnosis not present

## 2016-10-18 DIAGNOSIS — N390 Urinary tract infection, site not specified: Secondary | ICD-10-CM | POA: Diagnosis not present

## 2016-10-19 ENCOUNTER — Encounter: Payer: Medicare Other | Admitting: Podiatry

## 2016-10-20 ENCOUNTER — Ambulatory Visit (INDEPENDENT_AMBULATORY_CARE_PROVIDER_SITE_OTHER): Payer: Medicare Other

## 2016-10-20 ENCOUNTER — Ambulatory Visit (INDEPENDENT_AMBULATORY_CARE_PROVIDER_SITE_OTHER): Payer: Self-pay | Admitting: Podiatry

## 2016-10-20 ENCOUNTER — Ambulatory Visit: Payer: Medicare Other

## 2016-10-20 DIAGNOSIS — M2042 Other hammer toe(s) (acquired), left foot: Secondary | ICD-10-CM | POA: Diagnosis not present

## 2016-10-20 DIAGNOSIS — M2041 Other hammer toe(s) (acquired), right foot: Secondary | ICD-10-CM

## 2016-10-20 NOTE — Progress Notes (Signed)
Subjective:    Patient ID: Stacey Santana, female   DOB: 71 y.o.   MRN: [id number]   HPI patient states her toes feel real good    ROS      Objective:  Physical Exam neurovascular status intact with patient's right second and third toes in excellent position with good clinical alignment noted and pins in place     Assessment:    Clinically doing well with digital fusion digits 23 right     Plan:   Stitches removed x-rays reviewed and discussed that there is not perfect radiographic alignment but clinically they're doing beautifully and patient her stance is very satisfied currently with results. Reappoint one week for pin removal

## 2016-10-25 ENCOUNTER — Other Ambulatory Visit: Payer: Self-pay | Admitting: Family Medicine

## 2016-10-25 DIAGNOSIS — Z1231 Encounter for screening mammogram for malignant neoplasm of breast: Secondary | ICD-10-CM

## 2016-10-26 ENCOUNTER — Ambulatory Visit (INDEPENDENT_AMBULATORY_CARE_PROVIDER_SITE_OTHER): Payer: Medicare Other | Admitting: Podiatry

## 2016-10-26 ENCOUNTER — Encounter: Payer: Self-pay | Admitting: Podiatry

## 2016-10-26 ENCOUNTER — Ambulatory Visit (INDEPENDENT_AMBULATORY_CARE_PROVIDER_SITE_OTHER): Payer: Medicare Other

## 2016-10-26 VITALS — BP 146/94 | HR 89 | Resp 16

## 2016-10-26 DIAGNOSIS — M2041 Other hammer toe(s) (acquired), right foot: Secondary | ICD-10-CM

## 2016-10-26 NOTE — Progress Notes (Signed)
Subjective:    Patient ID: Stacey Santana, female   DOB: 71 y.o.   MRN: [id number]   HPI patient states that she is doing well with minimal discomfort and very happy with the position of her toes    ROS      Objective:  Physical Exam neurovascular status intact negative Homans sign was noted with patient second and third digit clinically and excellent alignment with pins in place     Assessment:    Doing well with severe digital deformity second and third with pins in place     Plan:    Pins removed second and third digits and satisfied with alignment. Advised on continuation of open toed shoes reappoint in approximate 4 weeks or earlier if needed  X-ray indicates that the third toe still is not in perfect alignment but clinically is functioning excellent and appears to be fusing

## 2016-11-01 ENCOUNTER — Ambulatory Visit
Admission: RE | Admit: 2016-11-01 | Discharge: 2016-11-01 | Disposition: A | Discharge status: Home / Self Care | Payer: Medicare Other | Source: Ambulatory Visit | Attending: Family Medicine | Admitting: Family Medicine

## 2016-11-01 DIAGNOSIS — Z1231 Encounter for screening mammogram for malignant neoplasm of breast: Secondary | ICD-10-CM

## 2016-11-29 DIAGNOSIS — N390 Urinary tract infection, site not specified: Secondary | ICD-10-CM | POA: Diagnosis not present

## 2016-11-29 DIAGNOSIS — N3946 Mixed incontinence: Secondary | ICD-10-CM | POA: Diagnosis not present

## 2016-11-29 DIAGNOSIS — N281 Cyst of kidney, acquired: Secondary | ICD-10-CM | POA: Diagnosis not present

## 2016-12-01 DIAGNOSIS — M25512 Pain in left shoulder: Secondary | ICD-10-CM | POA: Diagnosis not present

## 2016-12-01 DIAGNOSIS — M2041 Other hammer toe(s) (acquired), right foot: Secondary | ICD-10-CM | POA: Diagnosis not present

## 2016-12-01 DIAGNOSIS — Z7189 Other specified counseling: Secondary | ICD-10-CM | POA: Diagnosis not present

## 2016-12-01 DIAGNOSIS — M1990 Unspecified osteoarthritis, unspecified site: Secondary | ICD-10-CM | POA: Diagnosis not present

## 2016-12-01 DIAGNOSIS — N3946 Mixed incontinence: Secondary | ICD-10-CM | POA: Diagnosis not present

## 2016-12-07 ENCOUNTER — Ambulatory Visit (INDEPENDENT_AMBULATORY_CARE_PROVIDER_SITE_OTHER): Payer: Medicare Other | Admitting: Podiatry

## 2016-12-07 ENCOUNTER — Encounter: Payer: Self-pay | Admitting: Podiatry

## 2016-12-07 ENCOUNTER — Ambulatory Visit (INDEPENDENT_AMBULATORY_CARE_PROVIDER_SITE_OTHER): Payer: Medicare Other

## 2016-12-07 DIAGNOSIS — M2041 Other hammer toe(s) (acquired), right foot: Secondary | ICD-10-CM

## 2016-12-07 MED ORDER — MELOXICAM 15 MG PO TABS: 15.0000 mg | ORAL_TABLET | Freq: Every day | ORAL | 2 refills | Status: DC

## 2016-12-07 MED ORDER — HYDROCODONE-ACETAMINOPHEN 10-325 MG PO TABS: 1.0000 | ORAL_TABLET | Freq: Three times a day (TID) | ORAL | 0 refills | Status: DC | PRN

## 2016-12-07 NOTE — Progress Notes (Signed)
Subjective:    Patient ID: Stacey Santana, female   DOB: 71 y.o.   MRN: [id number]   HPI patient states overall she's doing well but she still getting some pain in her toes if she wears tighter shoes and she gets some throbbing at night and would like some medication and states she's going to [place] at the end of the month    ROS      Objective:  Physical Exam neurovascular status intact negative Homans sign noted with second and third digits X healing well with mild edema that's normal for this. Postop with mild discomfort     Assessment:    Doing well post fusion digits 23 right with mild swelling normal for this. Postop     Plan:   H&P x-rays reviewed and I did write her a prescription for Vicodin and low back with instructions on usage and this should be also need for the foreseeable future. She'll be seen back in 8 weeks or earlier if needed  X-rays indicate the digits and good alignment with adequate fusion occurring

## 2016-12-08 DIAGNOSIS — K219 Gastro-esophageal reflux disease without esophagitis: Secondary | ICD-10-CM | POA: Diagnosis not present

## 2016-12-08 DIAGNOSIS — Z1211 Encounter for screening for malignant neoplasm of colon: Secondary | ICD-10-CM | POA: Diagnosis not present

## 2016-12-12 DIAGNOSIS — M19012 Primary osteoarthritis, left shoulder: Secondary | ICD-10-CM | POA: Diagnosis not present

## 2016-12-15 DIAGNOSIS — F3341 Major depressive disorder, recurrent, in partial remission: Secondary | ICD-10-CM | POA: Diagnosis not present

## 2016-12-16 DIAGNOSIS — E119 Type 2 diabetes mellitus without complications: Secondary | ICD-10-CM | POA: Diagnosis not present

## 2016-12-16 DIAGNOSIS — Z7984 Long term (current) use of oral hypoglycemic drugs: Secondary | ICD-10-CM | POA: Diagnosis not present

## 2016-12-19 DIAGNOSIS — N3946 Mixed incontinence: Secondary | ICD-10-CM | POA: Diagnosis not present

## 2016-12-19 DIAGNOSIS — Z7984 Long term (current) use of oral hypoglycemic drugs: Secondary | ICD-10-CM | POA: Diagnosis not present

## 2016-12-19 DIAGNOSIS — I10 Essential (primary) hypertension: Secondary | ICD-10-CM | POA: Diagnosis not present

## 2016-12-19 DIAGNOSIS — E559 Vitamin D deficiency, unspecified: Secondary | ICD-10-CM | POA: Diagnosis not present

## 2016-12-19 DIAGNOSIS — Z79899 Other long term (current) drug therapy: Secondary | ICD-10-CM | POA: Diagnosis not present

## 2016-12-19 DIAGNOSIS — K219 Gastro-esophageal reflux disease without esophagitis: Secondary | ICD-10-CM | POA: Diagnosis not present

## 2016-12-19 DIAGNOSIS — E119 Type 2 diabetes mellitus without complications: Secondary | ICD-10-CM | POA: Diagnosis not present

## 2016-12-19 DIAGNOSIS — Z1211 Encounter for screening for malignant neoplasm of colon: Secondary | ICD-10-CM | POA: Diagnosis not present

## 2016-12-19 DIAGNOSIS — G4733 Obstructive sleep apnea (adult) (pediatric): Secondary | ICD-10-CM | POA: Diagnosis not present

## 2016-12-19 DIAGNOSIS — E785 Hyperlipidemia, unspecified: Secondary | ICD-10-CM | POA: Diagnosis not present

## 2017-01-25 ENCOUNTER — Ambulatory Visit: Payer: Medicare Other | Admitting: Podiatry

## 2017-01-30 DIAGNOSIS — Z1211 Encounter for screening for malignant neoplasm of colon: Secondary | ICD-10-CM | POA: Diagnosis not present

## 2017-01-30 DIAGNOSIS — K2930 Chronic superficial gastritis without bleeding: Secondary | ICD-10-CM | POA: Diagnosis not present

## 2017-01-30 DIAGNOSIS — K219 Gastro-esophageal reflux disease without esophagitis: Secondary | ICD-10-CM | POA: Diagnosis not present

## 2017-01-31 DIAGNOSIS — Z23 Encounter for immunization: Secondary | ICD-10-CM | POA: Diagnosis not present

## 2017-02-02 DIAGNOSIS — K2930 Chronic superficial gastritis without bleeding: Secondary | ICD-10-CM | POA: Diagnosis not present

## 2017-02-02 DIAGNOSIS — K219 Gastro-esophageal reflux disease without esophagitis: Secondary | ICD-10-CM | POA: Diagnosis not present

## 2017-02-09 ENCOUNTER — Ambulatory Visit (INDEPENDENT_AMBULATORY_CARE_PROVIDER_SITE_OTHER): Payer: Medicare Other

## 2017-02-09 ENCOUNTER — Encounter: Payer: Self-pay | Admitting: Podiatry

## 2017-02-09 ENCOUNTER — Ambulatory Visit (INDEPENDENT_AMBULATORY_CARE_PROVIDER_SITE_OTHER): Payer: Medicare Other | Admitting: Podiatry

## 2017-02-09 DIAGNOSIS — M2041 Other hammer toe(s) (acquired), right foot: Secondary | ICD-10-CM | POA: Diagnosis not present

## 2017-02-09 DIAGNOSIS — B079 Viral wart, unspecified: Secondary | ICD-10-CM

## 2017-02-09 DIAGNOSIS — Q828 Other specified congenital malformations of skin: Secondary | ICD-10-CM | POA: Diagnosis not present

## 2017-02-09 NOTE — Progress Notes (Signed)
Subjective:    Patient ID: Stacey Santana, female   DOB: 71 y.o.   MRN: [id number]   HPI patient states the toes continue to improve right and I have this lesion on the left it's been very painful    ROS      Objective:  Physical Exam neurovascular status intact with patient's lesser digits right doing well with wound edges well coapted good alignment noted diminished discomfort and on the left there is a keratotic lesion sub-first metatarsal that upon debridement shows pinpoint bleeding and pain to lateral pressure     Assessment:    Healing well from foot surgery right with verruca plantaris left     Plan:    H&P condition reviewed and at this point I debrided the lesion fully left I applied chemical agent to create reaction and for the right I've advised on gradual return to normal shoes  X-rays indicate that the toes are in good alignment with no indications of pathology

## 2017-02-10 DIAGNOSIS — S61412A Laceration without foreign body of left hand, initial encounter: Secondary | ICD-10-CM | POA: Diagnosis not present

## 2017-02-10 DIAGNOSIS — Y93G1 Activity, food preparation and clean up: Secondary | ICD-10-CM | POA: Diagnosis not present

## 2017-02-16 DIAGNOSIS — G4733 Obstructive sleep apnea (adult) (pediatric): Secondary | ICD-10-CM | POA: Diagnosis not present

## 2017-02-28 DIAGNOSIS — H2513 Age-related nuclear cataract, bilateral: Secondary | ICD-10-CM | POA: Diagnosis not present

## 2017-02-28 DIAGNOSIS — H353131 Nonexudative age-related macular degeneration, bilateral, early dry stage: Secondary | ICD-10-CM | POA: Diagnosis not present

## 2017-03-09 ENCOUNTER — Ambulatory Visit (INDEPENDENT_AMBULATORY_CARE_PROVIDER_SITE_OTHER): Payer: Medicare Other | Admitting: Podiatry

## 2017-03-09 ENCOUNTER — Encounter: Payer: Self-pay | Admitting: Podiatry

## 2017-03-09 DIAGNOSIS — M2041 Other hammer toe(s) (acquired), right foot: Secondary | ICD-10-CM | POA: Diagnosis not present

## 2017-03-09 DIAGNOSIS — B079 Viral wart, unspecified: Secondary | ICD-10-CM | POA: Diagnosis not present

## 2017-03-09 NOTE — Progress Notes (Signed)
Subjective:    Patient ID: Stacey Santana, female   DOB: 71 y.o.   MRN: [id number]   HPI patient presents with several lesions on the left states it's improving on the first metatarsal but still present and also the heel. States the toes on the right foot are doing well but they still swell she wears tighter shoe    ROS      Objective:  Physical Exam neurovascular status intact with keratotic lesion sub-left first metatarsal and plantar left heel that upon debridement show pinpoint bleeding along with digital deformities right were corrected and are in good position with moderate swelling     Assessment:    Verruca plantaris plantar left with digital deformities lesser digits right with moderate amount of postoperative swelling     Plan:    H&P both conditions reviewed and today I went ahead and for the left I debrided both lesions and applied To cure and sterile dressings. For the right I went ahead and discussed continued wider-type shoes and gradual swelling should reduce

## 2017-03-20 DIAGNOSIS — M19012 Primary osteoarthritis, left shoulder: Secondary | ICD-10-CM | POA: Diagnosis not present

## 2017-03-28 DIAGNOSIS — M2190 Unspecified acquired deformity of unspecified limb: Secondary | ICD-10-CM | POA: Diagnosis not present

## 2017-03-28 DIAGNOSIS — G4733 Obstructive sleep apnea (adult) (pediatric): Secondary | ICD-10-CM | POA: Diagnosis not present

## 2017-03-28 DIAGNOSIS — M19019 Primary osteoarthritis, unspecified shoulder: Secondary | ICD-10-CM | POA: Diagnosis not present

## 2017-03-28 DIAGNOSIS — I10 Essential (primary) hypertension: Secondary | ICD-10-CM | POA: Diagnosis not present

## 2017-03-28 DIAGNOSIS — E1165 Type 2 diabetes mellitus with hyperglycemia: Secondary | ICD-10-CM | POA: Diagnosis not present

## 2017-03-28 DIAGNOSIS — F331 Major depressive disorder, recurrent, moderate: Secondary | ICD-10-CM | POA: Diagnosis not present

## 2017-03-28 DIAGNOSIS — K2270 Barrett's esophagus without dysplasia: Secondary | ICD-10-CM | POA: Diagnosis not present

## 2017-03-28 DIAGNOSIS — E785 Hyperlipidemia, unspecified: Secondary | ICD-10-CM | POA: Diagnosis not present

## 2017-04-06 DIAGNOSIS — F3341 Major depressive disorder, recurrent, in partial remission: Secondary | ICD-10-CM | POA: Diagnosis not present

## 2017-05-17 NOTE — Pre-Procedure Instructions (Signed)
Stacey Santana  05/17/2017    Your procedure is scheduled on Thursday, May 25, 2017 at 7:30 AM.   Report to [facility] Entrance "A" Admitting Office at 5:30 AM.   Call this number if you have problems the morning of surgery: [phone]   Questions prior to day of surgery, please call [phone] between 8 & 4 PM.   Remember:  Do not eat food or drink liquids after midnight Wednesday, 05/24/17.  Take these medicines the morning of surgery with A SIP OF WATER: Metoprolol (Toprol XL), Venlafaxine XR (Effexor-XR), Tylenol - if needed  Do not take Metformin the day of surgery.  Stop NSAIDS (Ibuprofen, Aleve, etc) and Multivitamins 7 days prior to surgery.   How to Manage Your Diabetes Before Surgery   Why is it important to control my blood sugar before and after surgery?   Improving blood sugar levels before and after surgery helps healing and can limit problems.  A way of improving blood sugar control is eating a healthy diet by:  - Eating less sugar and carbohydrates  - Increasing activity/exercise  - Talk with your doctor about reaching your blood sugar goals  High blood sugars (greater than 180 mg/dL) can raise your risk of infections and slow down your recovery so you will need to focus on controlling your diabetes during the weeks before surgery.  Make sure that the doctor who takes care of your diabetes knows about your planned surgery including the date and location.  How do I manage my blood sugars before surgery?   Check your blood sugar at least 4 times a day, 2 days before surgery to make sure that they are not too high or low.  Check your blood sugar the morning of your surgery when you wake up and every 2 hours until you get to the Short-Stay unit.  Treat a low blood sugar (less than 70 mg/dL) with 1/2 cup of clear juice (cranberry or apple), 4 glucose tablets, OR glucose gel.  Recheck blood sugar in 15 minutes after treatment (to make sure it  is greater than 70 mg/dL).  If blood sugar is not greater than 70 mg/dL on re-check, call [phone] for further instructions.   Report your blood sugar to the Short-Stay nurse when you get to Short-Stay.  References:  [facility], 2007 "How to Manage your Diabetes Before and After Surgery".   Do not wear jewelry, make-up or nail polish.  Do not wear lotions, powders, perfumes or deodorant.  Do not shave 48 hours prior to surgery.    Do not bring valuables to the hospital.  [facility] is not responsible for any belongings or valuables.  Contacts, dentures or bridgework may not be worn into surgery.  Leave your suitcase in the car.  After surgery it may be brought to your room.  For patients admitted to the hospital, discharge time will be determined by your treatment team.  [facility] - Preparing for Surgery  Before surgery, you can play an important role.  Because skin is not sterile, your skin needs to be as free of germs as possible.  You can reduce the number of germs on you skin by washing with CHG (chlorahexidine gluconate) soap before surgery.  CHG is an antiseptic cleaner which kills germs and bonds with the skin to continue killing germs even after washing.  Please DO NOT use if you have an allergy to CHG or antibacterial soaps.  If your skin becomes  reddened/irritated stop using the CHG and inform your nurse when you arrive at Short Stay.  Do not shave (including legs and underarms) for at least 48 hours prior to the first CHG shower.  You may shave your face.  Please follow these instructions carefully:   1.  Shower with CHG Soap the night before surgery and the                    morning of Surgery.  2.  If you choose to wash your hair, wash your hair first as usual with your       normal shampoo.  3.  After you shampoo, rinse your hair and body thoroughly to remove the shampoo.  4.  Use CHG as you would any other liquid soap.  You can apply  chg directly       to the skin and wash gently with scrungie or a clean washcloth.  5.  Apply the CHG Soap to your body ONLY FROM THE NECK DOWN.        Do not use on open wounds or open sores.  Avoid contact with your eyes, ears, mouth and genitals (private parts).  Wash genitals (private parts) with your normal soap.  6.  Wash thoroughly, paying special attention to the area where your surgery        will be performed.  7.  Thoroughly rinse your body with warm water from the neck down.  8.  DO NOT shower/wash with your normal soap after using and rinsing off       the CHG Soap.  9.  Pat yourself dry with a clean towel.            10.  Wear clean pajamas.            11.  Place clean sheets on your bed the night of your first shower and do not        sleep with pets.  Day of Surgery  Do not apply any lotions/deodlrants the morning of surgery.  Please wear clean clothes to the hospital.   Please read over the fact sheets that you were given.

## 2017-05-18 ENCOUNTER — Other Ambulatory Visit: Payer: Self-pay

## 2017-05-18 ENCOUNTER — Encounter (HOSPITAL_COMMUNITY): Payer: Self-pay

## 2017-05-18 ENCOUNTER — Encounter (HOSPITAL_COMMUNITY)
Admission: RE | Admit: 2017-05-18 | Discharge: 2017-05-18 | Disposition: A | Discharge status: Home / Self Care | Payer: Medicare Other | Source: Ambulatory Visit | Attending: Orthopedic Surgery | Admitting: Orthopedic Surgery

## 2017-05-18 DIAGNOSIS — I10 Essential (primary) hypertension: Secondary | ICD-10-CM | POA: Insufficient documentation

## 2017-05-18 DIAGNOSIS — F419 Anxiety disorder, unspecified: Secondary | ICD-10-CM | POA: Insufficient documentation

## 2017-05-18 DIAGNOSIS — Z01812 Encounter for preprocedural laboratory examination: Secondary | ICD-10-CM | POA: Diagnosis not present

## 2017-05-18 DIAGNOSIS — Z01810 Encounter for preprocedural cardiovascular examination: Secondary | ICD-10-CM | POA: Insufficient documentation

## 2017-05-18 DIAGNOSIS — G4730 Sleep apnea, unspecified: Secondary | ICD-10-CM | POA: Diagnosis not present

## 2017-05-18 DIAGNOSIS — M19012 Primary osteoarthritis, left shoulder: Secondary | ICD-10-CM | POA: Diagnosis not present

## 2017-05-18 DIAGNOSIS — E119 Type 2 diabetes mellitus without complications: Secondary | ICD-10-CM | POA: Diagnosis not present

## 2017-05-18 DIAGNOSIS — R011 Cardiac murmur, unspecified: Secondary | ICD-10-CM | POA: Diagnosis not present

## 2017-05-18 DIAGNOSIS — F3341 Major depressive disorder, recurrent, in partial remission: Secondary | ICD-10-CM | POA: Diagnosis not present

## 2017-05-18 DIAGNOSIS — K219 Gastro-esophageal reflux disease without esophagitis: Secondary | ICD-10-CM | POA: Diagnosis not present

## 2017-05-18 HISTORY — DX: Unspecified osteoarthritis, unspecified site: M19.90

## 2017-05-18 HISTORY — DX: Sleep apnea, unspecified: G47.30

## 2017-05-18 HISTORY — DX: Type 2 diabetes mellitus without complications: E11.9

## 2017-05-18 HISTORY — DX: Essential (primary) hypertension: I10

## 2017-05-18 HISTORY — DX: Anxiety disorder, unspecified: F41.9

## 2017-05-18 HISTORY — DX: Major depressive disorder, single episode, unspecified: F32.9

## 2017-05-18 HISTORY — DX: Depression, unspecified: F32.A

## 2017-05-18 HISTORY — DX: Gastro-esophageal reflux disease without esophagitis: K21.9

## 2017-05-18 HISTORY — DX: Cardiac murmur, unspecified: R01.1

## 2017-05-18 HISTORY — DX: Adverse effect of unspecified anesthetic, initial encounter: T41.45XA

## 2017-05-18 LAB — BASIC METABOLIC PANEL
Anion gap: 11 (ref 5–15)
BUN: 12 mg/dL (ref 6–20)
CO2: 23 mmol/L (ref 22–32)
Calcium: 9.4 mg/dL (ref 8.9–10.3)
Chloride: 105 mmol/L (ref 101–111)
Creatinine, Ser: 0.66 mg/dL (ref 0.44–1.00)
Glucose, Bld: 120 mg/dL — ABNORMAL HIGH (ref 65–99)
POTASSIUM: 4.2 mmol/L (ref 3.5–5.1)
SODIUM: 139 mmol/L (ref 135–145)

## 2017-05-18 LAB — CBC
HCT: 39.7 % (ref 36.0–46.0)
Hemoglobin: 13.2 g/dL (ref 12.0–15.0)
MCH: 30.4 pg (ref 26.0–34.0)
MCHC: 33.2 g/dL (ref 30.0–36.0)
MCV: 91.5 fL (ref 78.0–100.0)
PLATELETS: 237 10*3/uL (ref 150–400)
RBC: 4.34 MIL/uL (ref 3.87–5.11)
RDW: 13.3 % (ref 11.5–15.5)
WBC: 6.5 10*3/uL (ref 4.0–10.5)

## 2017-05-18 LAB — SURGICAL PCR SCREEN
MRSA, PCR: NEGATIVE
STAPHYLOCOCCUS AUREUS: NEGATIVE

## 2017-05-18 LAB — HEMOGLOBIN A1C
HEMOGLOBIN A1C: 6 % — AB (ref 4.8–5.6)
MEAN PLASMA GLUCOSE: 125.5 mg/dL

## 2017-05-18 LAB — GLUCOSE, CAPILLARY: GLUCOSE-CAPILLARY: 132 mg/dL — AB (ref 65–99)

## 2017-05-18 NOTE — Progress Notes (Signed)
Pt. Reports that she had a stress test approx. 20 yrs. Ago, all was wnl, not needed to have any follow up other than with her PCP- Stacey Santana.

## 2017-05-18 NOTE — Pre-Procedure Instructions (Signed)
Tyrisha Benninger  05/18/2017      [id number] - [address], [place] [address] Phone: [phone] Fax: [phone]    Your procedure is scheduled on 05/25/2017.  Report to [facility] Admitting at 05;30 A.M.  Call this number if you have problems the morning of surgery:  [phone]   Remember:  Do not eat food or drink liquids after midnight.  Wednesday   Take these medicines the morning of surgery with A SIP OF WATER : Effexor, Metoprolol, Myrbetiriq, Tylenol if needed   Do not wear jewelry, make-up or nail polish.   Do not wear lotions, powders, or perfumes, or deodorant.   Do not shave 48 hours prior to surgery.    Do not bring valuables to the hospital.   [facility] is not responsible for any belongings or valuables.  Contacts, dentures or bridgework may not be worn into surgery.  Leave your suitcase in the car.  After surgery it may be brought to your room.  For patients admitted to the hospital, discharge time will be determined by your treatment team.  Patients discharged the day of surgery will not be allowed to drive home.   Name and phone number of your driver:   Family     How to Manage Your Diabetes Before and After Surgery  Why is it important to control my blood sugar before and after surgery? . Improving blood sugar levels before and after surgery helps healing and can limit problems. . A way of improving blood sugar control is eating a healthy diet by: o  Eating less sugar and carbohydrates o  Increasing activity/exercise o  Talking with your doctor about reaching your blood sugar goals . High blood sugars (greater than 180 mg/dL) can raise your risk of infections and slow your recovery, so you will need to focus on controlling your diabetes during the weeks before surgery. . Make sure that the doctor who takes care of your diabetes knows about your planned surgery including the date and  location.  How do I manage my blood sugar before surgery? . Check your blood sugar at least 4 times a day, starting 2 days before surgery, to make sure that the level is not too high or low.   (AT YOUR DISCRETION) o Check your blood sugar the morning of your surgery when you wake up and every 2 hours until you get to the Short Stay unit. . If your blood sugar is less than 70 mg/dL, you will need to treat for low blood sugar:  o Treat a low blood sugar (less than 70 mg/dL) with  cup of clear juice (cranberry or apple), 4 glucose tablets, OR glucose gel. Recheck blood sugar in 15 minutes after treatment (to make sure it is greater than 70 mg/dL). If your blood sugar is not greater than 70 mg/dL on recheck, call [phone] o  for further instructions. . Report your blood sugar to the short stay nurse when you get to Short Stay.  . If you are admitted to the hospital after surgery: o Your blood sugar will be checked by the staff and you will probably be given insulin after surgery (instead of oral diabetes medicines) to make sure you have good blood sugar levels. o The goal for blood sugar control after surgery is 80-180 mg/dL.     WHAT DO I DO ABOUT MY DIABETES MEDICATION?   Marland Kitchen Do not take oral diabetes  medicines (pills) the morning of surgery..       Other Instructions:          Patient Signature:  Date:   Nurse Signature:  Date:   Reviewed and Endorsed by [facility] Patient Education Committee, August 2015  Special instructions: Special Instructions: [facility] - Preparing for Surgery  Before surgery, you can play an important role.  Because skin is not sterile, your skin needs to be as free of germs as possible.  You can reduce the number of germs on you skin by washing with CHG (chlorahexidine gluconate) soap before surgery.  CHG is an antiseptic cleaner which kills germs and bonds with the skin to continue killing germs even after washing.  Please DO NOT use if you have  an allergy to CHG or antibacterial soaps.  If your skin becomes reddened/irritated stop using the CHG and inform your nurse when you arrive at Short Stay.  Do not shave (including legs and underarms) for at least 48 hours prior to the first CHG shower.  You may shave your face.  Please follow these instructions carefully:   1.  Shower with CHG Soap the night before surgery and the  morning of Surgery.  2.  If you choose to wash your hair, wash your hair first as usual with your  normal shampoo.  3.  After you shampoo, rinse your hair and body thoroughly to remove the  Shampoo.  4.  Use CHG as you would any other liquid soap.  You can apply chg directly to the skin and wash gently with scrungie or a clean washcloth.  5.  Apply the CHG Soap to your body ONLY FROM THE NECK DOWN.    Do not use on open wounds or open sores.  Avoid contact with your eyes, ears, mouth and genitals (private parts).  Wash genitals (private parts)   with your normal soap.  6.  Wash thoroughly, paying special attention to the area where your surgery will be performed.  7.  Thoroughly rinse your body with warm water from the neck down.  8.  DO NOT shower/wash with your normal soap after using and rinsing off   the CHG Soap.  9.  Pat yourself dry with a clean towel.            10.  Wear clean pajamas.            11.  Place clean sheets on your bed the night of your first shower and do not sleep with pets.  Day of Surgery  Do not apply any lotions/deodorants the morning of surgery.  Please wear clean clothes to the hospital/surgery center.  Please read over the following fact sheets that you were given. Pain Booklet, Coughing and Deep Breathing, MRSA Information and Surgical Site Infection Prevention

## 2017-05-24 MED ORDER — TRANEXAMIC ACID 1000 MG/10ML IV SOLN
1000.0000 mg | INTRAVENOUS | Status: AC
  Administered 2017-05-25: 1000 mg via INTRAVENOUS
  Filled 2017-05-24: qty 1100

## 2017-05-24 NOTE — Anesthesia Preprocedure Evaluation (Addendum)
Anesthesia Evaluation  Patient identified by MRN, date of birth, ID band Patient awake    Reviewed: Allergy & Precautions, H&P , Patient's Chart, lab work & pertinent test results  Airway Mallampati: II  TM Distance: >3 FB Neck ROM: full    Dental no notable dental hx.    Pulmonary sleep apnea and Continuous Positive Airway Pressure Ventilation ,    Pulmonary exam normal breath sounds clear to auscultation       Cardiovascular Exercise Tolerance: Good hypertension, On Medications  Rhythm:regular Rate:Normal     Neuro/Psych    GI/Hepatic   Endo/Other  diabetes, Type 2  Renal/GU      Musculoskeletal   Abdominal   Peds  Hematology   Anesthesia Other Findings   Reproductive/Obstetrics                             Anesthesia Physical Anesthesia Plan  ASA: III  Anesthesia Plan: General   Post-op Pain Management:  Regional for Post-op pain   Induction: Intravenous  PONV Risk Score and Plan: 2 and Dexamethasone, Ondansetron and Treatment may vary due to age or medical condition  Airway Management Planned: Oral ETT  Additional Equipment:   Intra-op Plan:   Post-operative Plan: Extubation in OR  Informed Consent: I have reviewed the patients History and Physical, chart, labs and discussed the procedure including the risks, benefits and alternatives for the proposed anesthesia with the patient or authorized representative who has indicated his/her understanding and acceptance.     Plan Discussed with:   Anesthesia Plan Comments: (  )       Anesthesia Quick Evaluation

## 2017-05-25 ENCOUNTER — Inpatient Hospital Stay (HOSPITAL_COMMUNITY): Payer: Medicare Other | Admitting: Anesthesiology

## 2017-05-25 ENCOUNTER — Inpatient Hospital Stay (HOSPITAL_COMMUNITY)
Admission: RE | Admit: 2017-05-25 | Discharge: 2017-05-26 | DRG: 483 | Disposition: A | Discharge status: Home / Self Care | Payer: Medicare Other | Source: Ambulatory Visit | Attending: Orthopedic Surgery | Admitting: Orthopedic Surgery

## 2017-05-25 ENCOUNTER — Other Ambulatory Visit: Payer: Self-pay

## 2017-05-25 ENCOUNTER — Encounter (HOSPITAL_COMMUNITY)
Admission: RE | Disposition: A | Discharge status: Home / Self Care | Payer: Self-pay | Source: Ambulatory Visit | Attending: Orthopedic Surgery

## 2017-05-25 ENCOUNTER — Encounter (HOSPITAL_COMMUNITY): Payer: Self-pay | Admitting: *Deleted

## 2017-05-25 DIAGNOSIS — G4730 Sleep apnea, unspecified: Secondary | ICD-10-CM | POA: Diagnosis present

## 2017-05-25 DIAGNOSIS — G8918 Other acute postprocedural pain: Secondary | ICD-10-CM | POA: Diagnosis not present

## 2017-05-25 DIAGNOSIS — K219 Gastro-esophageal reflux disease without esophagitis: Secondary | ICD-10-CM | POA: Diagnosis present

## 2017-05-25 DIAGNOSIS — M19012 Primary osteoarthritis, left shoulder: Secondary | ICD-10-CM | POA: Diagnosis not present

## 2017-05-25 DIAGNOSIS — F329 Major depressive disorder, single episode, unspecified: Secondary | ICD-10-CM | POA: Diagnosis present

## 2017-05-25 DIAGNOSIS — E7800 Pure hypercholesterolemia, unspecified: Secondary | ICD-10-CM | POA: Diagnosis present

## 2017-05-25 DIAGNOSIS — I10 Essential (primary) hypertension: Secondary | ICD-10-CM | POA: Diagnosis not present

## 2017-05-25 DIAGNOSIS — Z885 Allergy status to narcotic agent status: Secondary | ICD-10-CM | POA: Diagnosis not present

## 2017-05-25 DIAGNOSIS — E119 Type 2 diabetes mellitus without complications: Secondary | ICD-10-CM | POA: Diagnosis present

## 2017-05-25 DIAGNOSIS — Z96619 Presence of unspecified artificial shoulder joint: Secondary | ICD-10-CM

## 2017-05-25 DIAGNOSIS — F419 Anxiety disorder, unspecified: Secondary | ICD-10-CM | POA: Diagnosis not present

## 2017-05-25 DIAGNOSIS — R011 Cardiac murmur, unspecified: Secondary | ICD-10-CM | POA: Diagnosis present

## 2017-05-25 DIAGNOSIS — Z888 Allergy status to other drugs, medicaments and biological substances status: Secondary | ICD-10-CM

## 2017-05-25 DIAGNOSIS — Z7984 Long term (current) use of oral hypoglycemic drugs: Secondary | ICD-10-CM | POA: Diagnosis not present

## 2017-05-25 HISTORY — PX: TOTAL SHOULDER ARTHROPLASTY: SHX126

## 2017-05-25 HISTORY — DX: Pure hypercholesterolemia, unspecified: E78.00

## 2017-05-25 LAB — GLUCOSE, CAPILLARY
GLUCOSE-CAPILLARY: 169 mg/dL — AB (ref 65–99)
Glucose-Capillary: 130 mg/dL — ABNORMAL HIGH (ref 65–99)
Glucose-Capillary: 128 mg/dL — ABNORMAL HIGH (ref 65–99)
Glucose-Capillary: 189 mg/dL — ABNORMAL HIGH (ref 65–99)

## 2017-05-25 SURGERY — ARTHROPLASTY, SHOULDER, TOTAL
Anesthesia: General | Site: Shoulder | Laterality: Left

## 2017-05-25 MED ORDER — LIDOCAINE-EPINEPHRINE (PF) 1.5 %-1:200000 IJ SOLN
INTRAMUSCULAR | Status: DC | PRN
  Administered 2017-05-25: 5 mL via PERINEURAL

## 2017-05-25 MED ORDER — OXYCODONE HCL 5 MG PO TABS
5.0000 mg | ORAL_TABLET | ORAL | Status: DC | PRN
  Administered 2017-05-25: 5 mg via ORAL

## 2017-05-25 MED ORDER — CEFAZOLIN SODIUM-DEXTROSE 2-4 GM/100ML-% IV SOLN
2.0000 g | INTRAVENOUS | Status: AC
  Administered 2017-05-25: 2 g via INTRAVENOUS
  Filled 2017-05-25: qty 100

## 2017-05-25 MED ORDER — METOCLOPRAMIDE HCL 5 MG/ML IJ SOLN: 5.0000 mg | Freq: Three times a day (TID) | INTRAMUSCULAR | Status: DC | PRN

## 2017-05-25 MED ORDER — SUGAMMADEX SODIUM 200 MG/2ML IV SOLN
INTRAVENOUS | Status: AC
  Filled 2017-05-25: qty 2

## 2017-05-25 MED ORDER — DEXAMETHASONE SODIUM PHOSPHATE 10 MG/ML IJ SOLN
INTRAMUSCULAR | Status: AC
  Filled 2017-05-25: qty 1

## 2017-05-25 MED ORDER — DOCUSATE SODIUM 100 MG PO CAPS
100.0000 mg | ORAL_CAPSULE | Freq: Two times a day (BID) | ORAL | Status: DC
  Administered 2017-05-25 – 2017-05-26 (×2): 100 mg via ORAL
  Filled 2017-05-25 (×2): qty 1

## 2017-05-25 MED ORDER — PHENOL 1.4 % MT LIQD: 1.0000 | OROMUCOSAL | Status: DC | PRN

## 2017-05-25 MED ORDER — PHENYLEPHRINE HCL 10 MG/ML IJ SOLN
INTRAVENOUS | Status: DC | PRN
  Administered 2017-05-25: 20 ug/min via INTRAVENOUS

## 2017-05-25 MED ORDER — ONDANSETRON HCL 4 MG/2ML IJ SOLN: 4.0000 mg | Freq: Four times a day (QID) | INTRAMUSCULAR | Status: DC | PRN

## 2017-05-25 MED ORDER — TRIMETHOPRIM 100 MG PO TABS
100.0000 mg | ORAL_TABLET | Freq: Every day | ORAL | Status: DC
  Administered 2017-05-25: 100 mg via ORAL
  Filled 2017-05-25: qty 1

## 2017-05-25 MED ORDER — SUGAMMADEX SODIUM 200 MG/2ML IV SOLN
INTRAVENOUS | Status: DC | PRN
  Administered 2017-05-25: 200 mg via INTRAVENOUS

## 2017-05-25 MED ORDER — ACETAMINOPHEN 650 MG RE SUPP: 650.0000 mg | RECTAL | Status: DC | PRN

## 2017-05-25 MED ORDER — SIMVASTATIN 20 MG PO TABS
20.0000 mg | ORAL_TABLET | Freq: Every day | ORAL | Status: DC
  Administered 2017-05-25: 20 mg via ORAL
  Filled 2017-05-25: qty 1

## 2017-05-25 MED ORDER — POLYETHYLENE GLYCOL 3350 17 G PO PACK
17.0000 g | PACK | Freq: Every day | ORAL | Status: DC | PRN
Start: 2017-05-25 — End: 2017-05-26

## 2017-05-25 MED ORDER — PROPOFOL 10 MG/ML IV BOLUS
INTRAVENOUS | Status: DC | PRN
  Administered 2017-05-25: 160 mg via INTRAVENOUS

## 2017-05-25 MED ORDER — MIRABEGRON ER 25 MG PO TB24
50.0000 mg | ORAL_TABLET | Freq: Every day | ORAL | Status: DC
  Administered 2017-05-26: 50 mg via ORAL
  Filled 2017-05-25 (×2): qty 2

## 2017-05-25 MED ORDER — DIAZEPAM 5 MG PO TABS
2.5000 mg | ORAL_TABLET | Freq: Four times a day (QID) | ORAL | Status: DC | PRN
  Filled 2017-05-25: qty 1

## 2017-05-25 MED ORDER — VENLAFAXINE HCL ER 150 MG PO CP24
150.0000 mg | ORAL_CAPSULE | Freq: Every day | ORAL | Status: DC
  Administered 2017-05-26: 150 mg via ORAL
  Filled 2017-05-25 (×2): qty 1

## 2017-05-25 MED ORDER — PROPOFOL 10 MG/ML IV BOLUS
INTRAVENOUS | Status: AC
  Filled 2017-05-25: qty 20

## 2017-05-25 MED ORDER — INSULIN ASPART 100 UNIT/ML ~~LOC~~ SOLN: 0.0000 [IU] | Freq: Three times a day (TID) | SUBCUTANEOUS | Status: DC

## 2017-05-25 MED ORDER — ONDANSETRON HCL 4 MG PO TABS: 4.0000 mg | ORAL_TABLET | Freq: Four times a day (QID) | ORAL | Status: DC | PRN

## 2017-05-25 MED ORDER — FENTANYL CITRATE (PF) 100 MCG/2ML IJ SOLN: 25.0000 ug | INTRAMUSCULAR | Status: DC | PRN

## 2017-05-25 MED ORDER — ROPIVACAINE HCL 7.5 MG/ML IJ SOLN
INTRAMUSCULAR | Status: DC | PRN
  Administered 2017-05-25: 20 mL via PERINEURAL

## 2017-05-25 MED ORDER — ONDANSETRON HCL 4 MG/2ML IJ SOLN
INTRAMUSCULAR | Status: DC | PRN
  Administered 2017-05-25: 4 mg via INTRAVENOUS

## 2017-05-25 MED ORDER — FENTANYL CITRATE (PF) 250 MCG/5ML IJ SOLN
INTRAMUSCULAR | Status: AC
  Filled 2017-05-25: qty 5

## 2017-05-25 MED ORDER — MENTHOL 3 MG MT LOZG: 1.0000 | LOZENGE | OROMUCOSAL | Status: DC | PRN

## 2017-05-25 MED ORDER — MIDAZOLAM HCL 5 MG/5ML IJ SOLN
INTRAMUSCULAR | Status: DC | PRN
  Administered 2017-05-25: 2 mg via INTRAVENOUS

## 2017-05-25 MED ORDER — CHLORHEXIDINE GLUCONATE 4 % EX LIQD: 60.0000 mL | Freq: Once | CUTANEOUS | Status: DC

## 2017-05-25 MED ORDER — DEXAMETHASONE SODIUM PHOSPHATE 10 MG/ML IJ SOLN
INTRAMUSCULAR | Status: DC | PRN
  Administered 2017-05-25: 10 mg via INTRAVENOUS

## 2017-05-25 MED ORDER — LIDOCAINE 2% (20 MG/ML) 5 ML SYRINGE
INTRAMUSCULAR | Status: DC | PRN
  Administered 2017-05-25: 100 mg via INTRAVENOUS

## 2017-05-25 MED ORDER — BISACODYL 5 MG PO TBEC: 5.0000 mg | DELAYED_RELEASE_TABLET | Freq: Every day | ORAL | Status: DC | PRN

## 2017-05-25 MED ORDER — MIDAZOLAM HCL 2 MG/2ML IJ SOLN
INTRAMUSCULAR | Status: AC
  Filled 2017-05-25: qty 2

## 2017-05-25 MED ORDER — METOPROLOL SUCCINATE ER 25 MG PO TB24
25.0000 mg | ORAL_TABLET | Freq: Every day | ORAL | Status: DC
  Administered 2017-05-26: 25 mg via ORAL
  Filled 2017-05-25 (×2): qty 1

## 2017-05-25 MED ORDER — ALUM & MAG HYDROXIDE-SIMETH 200-200-20 MG/5ML PO SUSP: 30.0000 mL | ORAL | Status: DC | PRN

## 2017-05-25 MED ORDER — OXYCODONE HCL 5 MG PO TABS
10.0000 mg | ORAL_TABLET | ORAL | Status: DC | PRN
  Administered 2017-05-25 – 2017-05-26 (×5): 10 mg via ORAL
  Filled 2017-05-25 (×6): qty 2

## 2017-05-25 MED ORDER — ROCURONIUM BROMIDE 10 MG/ML (PF) SYRINGE
PREFILLED_SYRINGE | INTRAVENOUS | Status: AC
  Filled 2017-05-25: qty 5

## 2017-05-25 MED ORDER — LACTATED RINGERS IV SOLN
INTRAVENOUS | Status: DC
  Administered 2017-05-25: 12:00:00 via INTRAVENOUS

## 2017-05-25 MED ORDER — ROCURONIUM BROMIDE 10 MG/ML (PF) SYRINGE
PREFILLED_SYRINGE | INTRAVENOUS | Status: DC | PRN
  Administered 2017-05-25 (×2): 20 mg via INTRAVENOUS
  Administered 2017-05-25: 30 mg via INTRAVENOUS

## 2017-05-25 MED ORDER — METOCLOPRAMIDE HCL 5 MG PO TABS: 5.0000 mg | ORAL_TABLET | Freq: Three times a day (TID) | ORAL | Status: DC | PRN

## 2017-05-25 MED ORDER — CEFAZOLIN SODIUM-DEXTROSE 2-4 GM/100ML-% IV SOLN
2.0000 g | Freq: Four times a day (QID) | INTRAVENOUS | Status: AC
  Administered 2017-05-25 – 2017-05-26 (×3): 2 g via INTRAVENOUS
  Filled 2017-05-25 (×3): qty 100

## 2017-05-25 MED ORDER — HYDROMORPHONE HCL 1 MG/ML IJ SOLN: 0.5000 mg | INTRAMUSCULAR | Status: DC | PRN

## 2017-05-25 MED ORDER — PHENYLEPHRINE 40 MCG/ML (10ML) SYRINGE FOR IV PUSH (FOR BLOOD PRESSURE SUPPORT)
PREFILLED_SYRINGE | INTRAVENOUS | Status: DC | PRN
  Administered 2017-05-25 (×3): 40 ug via INTRAVENOUS
  Administered 2017-05-25: 80 ug via INTRAVENOUS
  Administered 2017-05-25 (×2): 40 ug via INTRAVENOUS
  Administered 2017-05-25: 80 ug via INTRAVENOUS
  Administered 2017-05-25: 40 ug via INTRAVENOUS

## 2017-05-25 MED ORDER — LIDOCAINE 2% (20 MG/ML) 5 ML SYRINGE
INTRAMUSCULAR | Status: AC
  Filled 2017-05-25: qty 5

## 2017-05-25 MED ORDER — FENTANYL CITRATE (PF) 100 MCG/2ML IJ SOLN
INTRAMUSCULAR | Status: DC | PRN
  Administered 2017-05-25: 25 ug via INTRAVENOUS
  Administered 2017-05-25: 50 ug via INTRAVENOUS

## 2017-05-25 MED ORDER — 0.9 % SODIUM CHLORIDE (POUR BTL) OPTIME
TOPICAL | Status: DC | PRN
  Administered 2017-05-25: 1000 mL

## 2017-05-25 MED ORDER — PANTOPRAZOLE SODIUM 40 MG PO TBEC
40.0000 mg | DELAYED_RELEASE_TABLET | Freq: Every day | ORAL | Status: DC
  Administered 2017-05-25 – 2017-05-26 (×2): 40 mg via ORAL
  Filled 2017-05-25 (×3): qty 1

## 2017-05-25 MED ORDER — ONDANSETRON HCL 4 MG/2ML IJ SOLN
INTRAMUSCULAR | Status: AC
  Filled 2017-05-25: qty 2

## 2017-05-25 MED ORDER — LOSARTAN POTASSIUM 50 MG PO TABS
100.0000 mg | ORAL_TABLET | Freq: Every day | ORAL | Status: DC
  Administered 2017-05-25: 100 mg via ORAL
  Filled 2017-05-25: qty 2

## 2017-05-25 MED ORDER — ACETAMINOPHEN 325 MG PO TABS
650.0000 mg | ORAL_TABLET | ORAL | Status: DC | PRN
  Administered 2017-05-25 – 2017-05-26 (×4): 650 mg via ORAL
  Filled 2017-05-25 (×4): qty 2

## 2017-05-25 MED ORDER — LACTATED RINGERS IV SOLN
INTRAVENOUS | Status: DC | PRN
  Administered 2017-05-25 (×2): via INTRAVENOUS

## 2017-05-25 MED ORDER — FLEET ENEMA 7-19 GM/118ML RE ENEM: 1.0000 | ENEMA | Freq: Once | RECTAL | Status: DC | PRN

## 2017-05-25 MED ORDER — DIPHENHYDRAMINE HCL 12.5 MG/5ML PO ELIX: 12.5000 mg | ORAL_SOLUTION | ORAL | Status: DC | PRN

## 2017-05-25 SURGICAL SUPPLY — 61 items
BLADE SAW SGTL 83.5X18.5 (BLADE) ×2 IMPLANT
CEMENT BONE DEPUY (Cement) ×2 IMPLANT
COVER SURGICAL LIGHT HANDLE (MISCELLANEOUS) ×2 IMPLANT
DERMABOND ADHESIVE PROPEN (GAUZE/BANDAGES/DRESSINGS) ×1
DERMABOND ADVANCED (GAUZE/BANDAGES/DRESSINGS) ×1
DERMABOND ADVANCED .7 DNX12 (GAUZE/BANDAGES/DRESSINGS) ×1 IMPLANT
DERMABOND ADVANCED .7 DNX6 (GAUZE/BANDAGES/DRESSINGS) ×1 IMPLANT
DRAPE ORTHO SPLIT 77X108 STRL (DRAPES) ×2
DRAPE SURG 17X11 SM STRL (DRAPES) ×2 IMPLANT
DRAPE SURG ORHT 6 SPLT 77X108 (DRAPES) ×2 IMPLANT
DRAPE U-SHAPE 47X51 STRL (DRAPES) ×2 IMPLANT
DRSG AQUACEL AG ADV 3.5X10 (GAUZE/BANDAGES/DRESSINGS) ×2 IMPLANT
DURAPREP 26ML APPLICATOR (WOUND CARE) ×2 IMPLANT
ELECT BLADE 4.0 EZ CLEAN MEGAD (MISCELLANEOUS) ×2
ELECT CAUTERY BLADE 6.4 (BLADE) ×2 IMPLANT
ELECT REM PT RETURN 9FT ADLT (ELECTROSURGICAL) ×2
ELECTRODE BLDE 4.0 EZ CLN MEGD (MISCELLANEOUS) ×1 IMPLANT
ELECTRODE REM PT RTRN 9FT ADLT (ELECTROSURGICAL) ×1 IMPLANT
FACESHIELD WRAPAROUND (MASK) ×6 IMPLANT
GLENOID WITH CLEAT MEDIUM (Shoulder) ×2 IMPLANT
GLOVE BIO SURGEON STRL SZ7.5 (GLOVE) ×2 IMPLANT
GLOVE BIO SURGEON STRL SZ8 (GLOVE) ×2 IMPLANT
GLOVE EUDERMIC 7 POWDERFREE (GLOVE) ×2 IMPLANT
GLOVE SS BIOGEL STRL SZ 7.5 (GLOVE) ×1 IMPLANT
GLOVE SUPERSENSE BIOGEL SZ 7.5 (GLOVE) ×1
GOWN STRL REUS W/ TWL LRG LVL3 (GOWN DISPOSABLE) ×1 IMPLANT
GOWN STRL REUS W/ TWL XL LVL3 (GOWN DISPOSABLE) ×2 IMPLANT
GOWN STRL REUS W/TWL LRG LVL3 (GOWN DISPOSABLE) ×1
GOWN STRL REUS W/TWL XL LVL3 (GOWN DISPOSABLE) ×2
HEAD HUMERAL UNIVERS II 48/19 (Head) ×2 IMPLANT
KIT BASIN OR (CUSTOM PROCEDURE TRAY) ×2 IMPLANT
KIT ROOM TURNOVER OR (KITS) ×2 IMPLANT
KIT SET UNIVERSAL (KITS) ×2 IMPLANT
MANIFOLD NEPTUNE II (INSTRUMENTS) ×2 IMPLANT
NDL SUT .5 MAYO 1.404X.05X (NEEDLE) ×1 IMPLANT
NDL SUT 6 .5 CRC .975X.05 MAYO (NEEDLE) ×1 IMPLANT
NEEDLE MAYO TAPER (NEEDLE) ×2
NS IRRIG 1000ML POUR BTL (IV SOLUTION) ×2 IMPLANT
PACK SHOULDER (CUSTOM PROCEDURE TRAY) ×2 IMPLANT
PAD ARMBOARD 7.5X6 YLW CONV (MISCELLANEOUS) ×4 IMPLANT
RESTRAINT HEAD UNIVERSAL NS (MISCELLANEOUS) ×2 IMPLANT
SLING ARM FOAM STRAP LRG (SOFTGOODS) IMPLANT
SLING ARM IMMOBILIZER LRG (SOFTGOODS) ×2 IMPLANT
SLING ARM XL FOAM STRAP (SOFTGOODS) ×2 IMPLANT
SMARTMIX MINI TOWER (MISCELLANEOUS) ×2
SPONGE LAP 18X18 X RAY DECT (DISPOSABLE) ×2 IMPLANT
SPONGE LAP 4X18 X RAY DECT (DISPOSABLE) IMPLANT
STEM HUMERAL APEX UNI 9MM (Stem) ×2 IMPLANT
SUCTION FRAZIER HANDLE 10FR (MISCELLANEOUS) ×1
SUCTION TUBE FRAZIER 10FR DISP (MISCELLANEOUS) ×1 IMPLANT
SUT FIBERWIRE #2 38 T-5 BLUE (SUTURE) ×8
SUT MNCRL AB 3-0 PS2 18 (SUTURE) ×2 IMPLANT
SUT MON AB 2-0 CT1 36 (SUTURE) ×2 IMPLANT
SUT VIC AB 1 CT1 27 (SUTURE) ×1
SUT VIC AB 1 CT1 27XBRD ANBCTR (SUTURE) ×1 IMPLANT
SUTURE FIBERWR #2 38 T-5 BLUE (SUTURE) ×4 IMPLANT
SYR CONTROL 10ML LL (SYRINGE) IMPLANT
TOWEL OR 17X24 6PK STRL BLUE (TOWEL DISPOSABLE) ×2 IMPLANT
TOWEL OR 17X26 10 PK STRL BLUE (TOWEL DISPOSABLE) ×2 IMPLANT
TOWER SMARTMIX MINI (MISCELLANEOUS) ×1 IMPLANT
WATER STERILE IRR 1000ML POUR (IV SOLUTION) IMPLANT

## 2017-05-25 NOTE — Op Note (Signed)
NAME:  Stacey Santana, Stacey Santana                   ACCOUNT NO.:  MEDICAL RECORD NO.:  [id number]  LOCATION:                                 FACILITY:  PHYSICIAN:  Metta Clines. Betsy Rosello, M.D.       DATE OF BIRTH:  DATE OF PROCEDURE:  05/25/2017 DATE OF DISCHARGE:                              OPERATIVE REPORT   PREOPERATIVE DIAGNOSIS:  End-stage left shoulder osteoarthritis.  POSTOPERATIVE DIAGNOSIS:  End-stage left shoulder osteoarthritis.  PROCEDURE:  A left total shoulder arthroplasty utilizing a press-fit size 9 Arthrex stem with a 48 x 19 eccentric head and a medium glenoid.  SURGEON:  Metta Clines. Brandee Markin, MD.  Terrence DupontReather Laurence Shuford, P.A.-C.  ANESTHESIA:  General endotracheal as well as an interscalene block.  ESTIMATED BLOOD LOSS:  100 mL.  DRAINS:  None.  HISTORY:  Ms. Lamy is a 72 year old female, who has had chronic and progressive increasing left shoulder pain related to end-stage osteoarthritis.  Her clinical exam shows very painful and restricted mobility with radiographs confirming complete obliteration of the joint space, B2 glenoid and severe peripheral osteophyte formation.  She is brought to the operating room at this time, for planned left total shoulder arthroplasty.  Preoperatively, I counseled Ms. Manansala regarding treatment options and potential risks versus benefits thereof.  Possible surgical complications were all reviewed including potential for bleeding, infection, neurovascular injury, persistent pain, loss of motion, anesthetic complication, failure of the implant, and possible need for additional surgery.  She understands and accepts and agrees with our planned procedure.  PROCEDURE IN DETAIL:  After undergoing routine preop evaluation, the patient received prophylactic antibiotics.  An interscalene block was established in the holding area by the Anesthesia Department.  Placed supine on the operating table.  Underwent smooth induction of a  general endotracheal anesthesia.  Placed in the beach-chair position and appropriately padded and protected.  The left shoulder girdle region was sterilely prepped and draped in standard fashion.  Time-out was called. An anterior deltopectoral approach was made to left shoulder through an 8 cm incision.  Skin flaps were elevated.  Dissection was carried deeply.  Electrocautery was used for hemostasis.  The conjoint tendon was mobilized and retracted medially.  The long head biceps tendon was then tenodesed at the upper border of the pec major.  It was then tenotomized, unroofed, and resected proximally.  The subscapularis was then divided away from the lesser tuberosity leaving a 1 to 1.5 cm cuff of tissue for later repair, and the free margin was tagged with a series of #2 FiberWire grasping sutures.  The capsular attachments from the anterior-inferior and inferior aspects of the humeral neck were then divided subperiosteally allowing the humeral head to be delivered through the wound.  The rotator cuff was then carefully protected superiorly and posteriorly.  We outlined our proposed humeral head resection at the native retroversion approximately 30 degrees and this was then completed with an oscillating saw and then the osteophytes were removed from the anterior and inferior margins of the humeral neck.  We then prepared the humeral canal, hand reaming up to size 7, broaching up to size 9 with excellent fit  and fixation.  A size 8 stem with metal cap placed into the proximal humeral metaphysis and we then exposed the glenoid with combination of Fukuda, pitchfork, and snake tongue retractors.  I performed a circumferential labral resection gaining complete visualization of the periphery of the glenoid.  A guidepin placed in center of the glenoid after we determined the medium was the appropriate size and fit.  The glenoid was then reamed with the medium reamer and there was just a very  mild posterior glenoid deficiency consistent with a B2 deformity, but again, this was very mild and this was nicely corrected with our reaming.  I then placed a central drill hole, followed by the infant's superior and inferior peg and slot respectively.  Glenoid was then broached.  All debris was then carefully removed and trial showed excellent fit and fixation.  At this point, the cement was mixed.  The glenoid was cleaned and dried.  The cement was introduced into the superior and inferior peg and slot respectively and our glenoid was then impacted with excellent fit and fixation.  We then returned our attention back to the humeral shaft where it was cleaned and the final size 9 stem was then impacted to the appropriate level with excellent interference fit and fixation.  The proximal locking screws were then tightened.  We then performed trial reduction and found that the 48 x 19 eccentric head gave [place] excellent proximal humeral coverage and approximately 50% translation of the humeral head and the glenoid.  Trial was removed.  The Morse taper was cleaned and dried. The final 48 x 19 head was then impacted.  Final reduction showed again excellent shoulder motion and stability.  At this point, the subscapularis was then repaired back to the lesser tuberosity through the cuff of tissue using horizontal mattress sutures of #2 FiberWires. The rotator interval was also repaired with figure-of-eight #2 FiberWire sutures.  At this point, the shoulder showed excellent motion and stability easily achieving 30 degrees of external rotation without excessive tension on the subscap repair.  The wound was again irrigated, cleaned, and dried.  The deltopectoral interval was then reapproximated with a series of figure-of-eight #1 Vicryl sutures.  2-0 Monocryl was used for the subcu layer, intracuticular 3-0 Monocryl for the skin, followed by Dermabond and Aquacel dressing.  Left arm was then  placed into a sling.  The patient was awakened, extubated, and taken to the recovery room in stable condition.  Jenetta Loges, P.A.-C, was used as an Environmental consultant throughout this case, essential for help with positioning the patient, positioning the extremity, tissue manipulation, suture management, implantation of prosthesis, wound closure, and intraoperative decision making.     Metta Clines. Glenroy Crossen, M.D.     KMS/MEDQ  D:  05/25/2017  T:  05/25/2017  Job:  [id number]

## 2017-05-25 NOTE — Op Note (Signed)
05/25/2017  9:23 AM  PATIENT:   Stacey Santana  72 y.o. female  PRE-OPERATIVE DIAGNOSIS:  Left shoulder osteoarthritis  POST-OPERATIVE DIAGNOSIS:  same  PROCEDURE:  L TSA #9 stem, 48x19 head, medium glenoid  SURGEON:  Keirstan Iannello, Metta Clines M.D.  ASSISTANTS: Shuford pac   ANESTHESIA:   GET + ISB  EBL: 100  SPECIMEN:  none  Drains: none   PATIENT DISPOSITION:  PACU - hemodynamically stable.    PLAN OF CARE: Admit for overnight observation  Dictation# [id number]   Contact # [phone]

## 2017-05-25 NOTE — Anesthesia Procedure Notes (Signed)
Anesthesia Regional Block: Interscalene brachial plexus block   Pre-Anesthetic Checklist: ,, timeout performed, Correct Patient, Correct Site, Correct Laterality, Correct Procedure, Correct Position, site marked, Risks and benefits discussed, pre-op evaluation,  At surgeon's request and post-op pain management  Laterality: Left  Prep: chloraprep       Needles:   Needle Type: Echogenic Needle     Needle Length: 9cm  Needle Gauge: 21     Additional Needles:   Procedures:,,,, ultrasound used (permanent image in chart),,,,  Narrative:  Start time: 05/25/2017 6:48 AM End time: 05/25/2017 6:56 AM Injection made incrementally with aspirations every 5 mL. Anesthesiologist: Lyndle Herrlich, MD

## 2017-05-25 NOTE — Anesthesia Procedure Notes (Signed)
Procedure Name: Intubation Date/Time: 05/25/2017 7:37 AM Performed by: Renato Shin, CRNA Pre-anesthesia Checklist: Patient identified, Emergency Drugs available, Suction available, Patient being monitored and Timeout performed Patient Re-evaluated:Patient Re-evaluated prior to induction Oxygen Delivery Method: Circle system utilized Preoxygenation: Pre-oxygenation with 100% oxygen Induction Type: IV induction Ventilation: Mask ventilation without difficulty Laryngoscope Size: Miller and 3 Grade View: Grade I Tube type: Oral Tube size: 7.0 mm Number of attempts: 1 Placement Confirmation: ETT inserted through vocal cords under direct vision and breath sounds checked- equal and bilateral Secured at: 22 cm Tube secured with: Tape Dental Injury: Teeth and Oropharynx as per pre-operative assessment

## 2017-05-25 NOTE — Discharge Instructions (Signed)

## 2017-05-25 NOTE — Transfer of Care (Signed)
Immediate Anesthesia Transfer of Care Note  Patient: Rosie Torrez  Procedure(s) Performed: LEFT TOTAL SHOULDER ARTHROPLASTY (Left Shoulder)  Patient Location: PACU  Anesthesia Type:General, Regional and GA combined with regional for post-op pain  Level of Consciousness: awake, alert , oriented and patient cooperative  Airway & Oxygen Therapy: Patient Spontanous Breathing and Patient connected to nasal cannula oxygen  Post-op Assessment: Report given to RN, Post -op Vital signs reviewed and stable and Patient moving all extremities  Post vital signs: stable  Last Vitals:  Vitals:   05/25/17 0609  BP: (!) 153/69  Pulse: 89  Resp: 20  Temp: 36.7 C  SpO2: 97%    Last Pain:  Vitals:   05/25/17 0623  TempSrc:   PainSc: 3       Patients Stated Pain Goal: 3 (92/33/00 7622)  Complications: No apparent anesthesia complications

## 2017-05-25 NOTE — H&P (Signed)
Stacey Santana    Chief Complaint: Left shoulder osteoarthritis HPI: The patient is a 72 y.o. female with end stage left shoulder OA  Past Medical History:  Diagnosis Date  . Anxiety   . Arthritis    some degenerative signs in other places of her body   . Complication of anesthesia 1993   used scopolamine patch during surgery- manic episode occurred post op , met with the anesth. dept. afterwards & they thought that the episode was related to scop patch.   . Depression    chronic   . Diabetes mellitus without complication ([place])    type II  . GERD (gastroesophageal reflux disease)   . Heart murmur    slight murmur   . Hypertension   . Sleep apnea    CPAP     Past Surgical History:  Procedure Laterality Date  . FOOT SURGERY     hammertoe - R foot & bunionectomy on both feet   . REDUCTION MAMMAPLASTY Bilateral 1993?  . TUBAL LIGATION      History reviewed. No pertinent family history.  Social History:  reports that  has never smoked. she has never used smokeless tobacco. She reports that she does not drink alcohol or use drugs.   Medications Prior to Admission  Medication Sig Dispense Refill  . acetaminophen (TYLENOL) 500 MG tablet Take 1,000 mg by mouth every 6 (six) hours as needed for moderate pain or headache.    . ALPRAZolam (XANAX) 0.25 MG tablet Take 0.25 mg by mouth at bedtime as needed for anxiety.    Marland Kitchen b complex vitamins tablet Take 1 tablet by mouth daily.    Marland Kitchen CALCIUM-MAGNESIUM-ZINC PO Take 1 tablet by mouth at bedtime.    . Cholecalciferol (VITAMIN D3) 1000 units CAPS Take 1,000 Units by mouth daily.     Marland Kitchen ibuprofen (ADVIL,MOTRIN) 600 MG tablet Take 600 mg by mouth 2 (two) times daily.    Marland Kitchen losartan (COZAAR) 100 MG tablet Take 100 mg by mouth at bedtime.     . metFORMIN (GLUCOPHAGE) 500 MG tablet Take 500 mg by mouth daily with breakfast.     . metoprolol succinate (TOPROL-XL) 50 MG 24 hr tablet Take 25 mg by mouth daily.     . Multiple Vitamins-Minerals (EYE  VITAMINS & MINERALS PO) Take 1 capsule by mouth daily.    Marland Kitchen MYRBETRIQ 50 MG TB24 tablet Take 50 mg by mouth daily.     Marland Kitchen omeprazole (PRILOSEC) 20 MG capsule Take 20 mg by mouth at bedtime.     . simvastatin (ZOCOR) 20 MG tablet Take 20 mg by mouth at bedtime.     . traMADol (ULTRAM) 50 MG tablet Take 50 mg by mouth at bedtime as needed for moderate pain.   0  . trimethoprim (TRIMPEX) 100 MG tablet Take 100 mg by mouth at bedtime.     Marland Kitchen venlafaxine XR (EFFEXOR-XR) 150 MG 24 hr capsule Take 150 mg by mouth daily with breakfast.    . HYDROcodone-acetaminophen (NORCO) 10-325 MG tablet Take 1 tablet by mouth every 8 (eight) hours as needed. (Patient not taking: Reported on 05/12/2017) 30 tablet 0  . promethazine (PHENERGAN) 25 MG tablet Take one tablet every 4-6 hours prn nausea. (Patient not taking: Reported on 05/12/2017) 3 tablet 0     Physical Exam: left shoulder with painful and restricted motion as noted at recent office visits  Vitals  Temp:  [98.1 F (36.7 C)] 98.1 F (36.7 C) (01/24 0609) Pulse Rate:  [  89] 89 (01/24 0609) Resp:  [20] 20 (01/24 0609) BP: (153)/(69) 153/69 (01/24 0609) SpO2:  [97 %] 97 % (01/24 0609) Weight:  [90.3 kg (199 lb)] 90.3 kg (199 lb) (01/24 8978)  Assessment/Plan  Impression: Left shoulder osteoarthritis  Plan of Action: Procedure(s): LEFT TOTAL SHOULDER ARTHROPLASTY  Stacey Santana 05/25/2017, 7:22 AM Contact # [phone]

## 2017-05-26 ENCOUNTER — Encounter (HOSPITAL_COMMUNITY): Payer: Self-pay | Admitting: Orthopedic Surgery

## 2017-05-26 LAB — GLUCOSE, CAPILLARY
GLUCOSE-CAPILLARY: 178 mg/dL — AB (ref 65–99)
Glucose-Capillary: 223 mg/dL — ABNORMAL HIGH (ref 65–99)
Glucose-Capillary: 170 mg/dL — ABNORMAL HIGH (ref 65–99)

## 2017-05-26 MED ORDER — ONDANSETRON HCL 4 MG PO TABS: 4.0000 mg | ORAL_TABLET | Freq: Three times a day (TID) | ORAL | 0 refills | Status: DC | PRN

## 2017-05-26 MED ORDER — HYDROCODONE-ACETAMINOPHEN 10-325 MG PO TABS: 1.0000 | ORAL_TABLET | ORAL | 0 refills | Status: DC | PRN

## 2017-05-26 MED ORDER — DIAZEPAM 5 MG PO TABS: 2.5000 mg | ORAL_TABLET | Freq: Four times a day (QID) | ORAL | 1 refills | Status: DC | PRN

## 2017-05-26 NOTE — Plan of Care (Signed)
  Nutrition: Adequate nutrition will be maintained 05/26/2017 1035 - Progressing by Williams Che, RN   Elimination: Will not experience complications related to bowel motility 05/26/2017 1035 - Progressing by Williams Che, RN   Pain Managment: General experience of comfort will improve 05/26/2017 1035 - Progressing by Williams Che, RN

## 2017-05-26 NOTE — Progress Notes (Signed)
Removed IV, provided discharge education/instructions, all questions and concerns addressed, Pt not in distress, discharged home with belongings. 

## 2017-05-26 NOTE — Evaluation (Signed)
Occupational Therapy Evaluation Patient Details Name: Stacey Santana MRN: [id number] DOB: Oct 20, 1945 Today's Date: 05/26/2017    History of Present Illness Pt is a 72 y/o F now s/p L TSA; PMHx includes: anxiety, depression, HTN, DM   Clinical Impression   This 72 y/o F presents with the above. At baseline Pt is independent with ADLs and functional mobility. Pt presenting with increased pain and decreased functional status due to LUE functional limitations. Pt completing room level functional mobility with MinGuard assist this session, currently requires MinA for LB ADLs, ModA for UB ADLs. Education provided on shoulder protocol, HEP, and compensatory techniques for completing ADLs while adhering to precautions with Pt verbalizing and return demonstrating good understanding. Pt lives alone, will initially be returning home with 24hr assist from friends/family for the first 1-2 weeks. Feel Pt is safe to return home with available support/assist PRN after discharge. Recommend follow up as arranged by MD; no further acute OT needs identified at this time. Will sign off.     Follow Up Recommendations  DC plan and follow up therapy as arranged by surgeon;Supervision/Assistance - 24 hour;Other (comment)(24 hr initially )    Equipment Recommendations  None recommended by OT           Precautions / Restrictions Precautions Precautions: Shoulder Type of Shoulder Precautions: Passive Protocol: NWB UE, AROM to e/w/h only, no AROM to shoulder, okay for gentle pendulums, okay to shower, may come out of sling when sitting in controlled environment, okay to use UE for ADL completion within PROM limits: (ER: 0-20, ABD: 0-45, FF: 0-60) Shoulder Interventions: Shoulder sling/immobilizer;Off for dressing/bathing/exercises Precaution Booklet Issued: Yes (comment) Required Braces or Orthoses: Sling Restrictions Weight Bearing Restrictions: Yes LUE Weight Bearing: Non weight bearing      Mobility Bed  Mobility Overal bed mobility: Needs Assistance Bed Mobility: Supine to Sit;Sit to Supine     Supine to sit: HOB elevated;Supervision Sit to supine: HOB elevated;Supervision   General bed mobility comments: supervision for safety   Transfers Overall transfer level: Modified independent Equipment used: None             General transfer comment: supervision for safety, no physical assist needed     Balance Overall balance assessment: Needs assistance Sitting-balance support: Feet supported;No upper extremity supported Sitting balance-Leahy Scale: Good     Standing balance support: No upper extremity supported;During functional activity Standing balance-Leahy Scale: Good                             ADL either performed or assessed with clinical judgement   ADL Overall ADL's : Needs assistance/impaired Eating/Feeding: Sitting;Modified independent   Grooming: Supervision/safety;Standing;Oral care;Brushing hair   Upper Body Bathing: Minimal assistance;Sitting   Lower Body Bathing: Minimal assistance;Sit to/from stand   Upper Body Dressing : Moderate assistance;Sitting;Adhering to UE precautions   Lower Body Dressing: Minimal assistance;Sit to/from stand   Toilet Transfer: Min guard;Ambulation;Regular Toilet   Toileting- Clothing Manipulation and Hygiene: Minimal assistance;Sit to/from stand Toileting - Clothing Manipulation Details (indicate cue type and reason): for clothing management    Tub/Shower Transfer Details (indicate cue type and reason): educated to use shower chair during bathing initially for increased safety during task completion  Functional mobility during ADLs: Min guard General ADL Comments: education provided regarding shoulder precautions, compensatory tehcniques for completing ADLs while adhering to precautions with Pt verbalizing and return demonstrating understanding     Vision Baseline Vision/History: Wears glasses Wears Glasses:  At all times Vision Assessment?: No apparent visual deficits                Pertinent Vitals/Pain Pain Assessment: Faces Faces Pain Scale: Hurts even more Pain Location: LUE  Pain Descriptors / Indicators: Grimacing;Crying;Guarding Pain Intervention(s): Limited activity within patient's tolerance;Monitored during session;Premedicated before session     Hand Dominance Right   Extremity/Trunk Assessment Upper Extremity Assessment Upper Extremity Assessment: LUE deficits/detail LUE Deficits / Details: s/p L TSA  LUE: Unable to fully assess due to pain;Unable to fully assess due to immobilization   Lower Extremity Assessment Lower Extremity Assessment: Overall WFL for tasks assessed       Communication Communication Communication: No difficulties   Cognition Arousal/Alertness: Awake/alert Behavior During Therapy: WFL for tasks assessed/performed Overall Cognitive Status: Within Functional Limits for tasks assessed                                           Exercises Shoulder Exercises Pendulum Exercise: PROM;5 reps;Left;Standing Elbow Flexion: AROM;10 reps;Left;Standing Elbow Extension: AROM;10 reps;Left;Standing Wrist Flexion: AROM;10 reps;Left;Seated Wrist Extension: AROM;10 reps;Seated;Left Digit Composite Flexion: AROM;Left;Seated;10 reps Composite Extension: AROM;10 reps;Left;Seated Neck Flexion: AROM;Seated Neck Extension: AROM;Seated Neck Lateral Flexion - Right: AROM;Seated Neck Lateral Flexion - Left: AROM;Seated Hand Exercises Forearm Supination: AROM;10 reps;Left;Seated Forearm Pronation: AROM;10 reps;Left;Seated   Shoulder Instructions Shoulder Instructions Donning/doffing shirt without moving shoulder: Moderate assistance;Patient able to independently direct caregiver Method for sponge bathing under operated UE: Min-guard Donning/doffing sling/immobilizer: Moderate assistance;Patient able to independently direct caregiver Correct  positioning of sling/immobilizer: Moderate assistance;Patient able to independently direct caregiver Pendulum exercises (written home exercise program): Min-guard ROM for elbow, wrist and digits of operated UE: Min-guard Sling wearing schedule (on at all times/off for ADL's): Modified independent Proper positioning of operated UE when showering: Min-guard Positioning of UE while sleeping: Minimal assistance;Patient able to independently direct caregiver    Home Living Family/patient expects to be discharged to:: Private residence Living Arrangements: Alone Available Help at Discharge: Family;Friend(s);Available 24 hours/day(friends/family staying first 12 days ) Type of Home: Other(Comment)(townhouse) Home Access: Stairs to enter Entrance Stairs-Number of Steps: 2 Entrance Stairs-Rails: None Home Layout: Two level;Able to live on main level with bedroom/bathroom Alternate Level Stairs-Number of Steps: 5-6, landing, 3, and then 5-6 more Alternate Level Stairs-Rails: Left Bathroom Shower/Tub: Occupational psychologist: (comfort height)     Home Equipment: Cane - single point          Prior Functioning/Environment Level of Independence: Independent        Comments: driving        OT Problem List: Pain;Decreased knowledge of precautions;Decreased activity tolerance;Impaired UE functional use;Decreased range of motion      OT Treatment/Interventions:      OT Goals(Current goals can be found in the care plan section) Acute Rehab OT Goals Patient Stated Goal: return home, less pain  OT Goal Formulation: All assessment and education complete, DC therapy                                 AM-PAC PT "6 Clicks" Daily Activity     Outcome Measure Help from another person eating meals?: None Help from another person taking care of personal grooming?: None Help from another person toileting, which includes using toliet, bedpan, or urinal?: A Little Help from  another  person bathing (including washing, rinsing, drying)?: A Little Help from another person to put on and taking off regular upper body clothing?: A Lot Help from another person to put on and taking off regular lower body clothing?: A Little 6 Click Score: 19   End of Session Equipment Utilized During Treatment: Other (comment)(sling ) Nurse Communication: Mobility status  Activity Tolerance: Patient tolerated treatment well Patient left: in bed;with call bell/phone within reach  OT Visit Diagnosis: Pain Pain - Right/Left: Left Pain - part of body: Shoulder                Time: 0921-1009 OT Time Calculation (min): 48 min Charges:  OT General Charges $OT Visit: 1 Visit OT Evaluation $OT Eval Low Complexity: 1 Low OT Treatments $Self Care/Home Management : 23-37 mins G-Codes:     Lou Cal, OT Pager [phone] 05/26/2017   Raymondo Band 05/26/2017, 10:59 AM

## 2017-05-26 NOTE — Discharge Summary (Signed)
PATIENT ID:      Stacey Santana  MRN:     [id number] DOB/AGE:    70-Nov-1947 / 70 y.o.     DISCHARGE SUMMARY  ADMISSION DATE:    05/25/2017 DISCHARGE DATE:    ADMISSION DIAGNOSIS: Left shoulder osteoarthritis Past Medical History:  Diagnosis Date  . Anxiety   . Arthritis    some degenerative signs in other places of her body   . Complication of anesthesia 1993   used scopolamine patch during surgery- manic episode occurred post op , met with the anesth. dept. afterwards & they thought that the episode was related to scop patch.   . Depression    chronic   . Diabetes mellitus without complication ([place])    type II  . GERD (gastroesophageal reflux disease)   . Heart murmur    slight murmur   . High cholesterol   . Hypertension   . Sleep apnea    CPAP     DISCHARGE DIAGNOSIS:   Active Problems:   Status post total shoulder arthroplasty   PROCEDURE: Procedure(s): LEFT TOTAL SHOULDER ARTHROPLASTY on 05/25/2017  CONSULTS:    HISTORY:  See H&P in chart.  HOSPITAL COURSE:  Stacey Santana is a 72 y.o. admitted on 05/25/2017 with a diagnosis of Left shoulder osteoarthritis.  They were brought to the operating room on 05/25/2017 and underwent Procedure(s): LEFT TOTAL SHOULDER ARTHROPLASTY.    They were given perioperative antibiotics:  Anti-infectives (From admission, onward)   Start     Dose/Rate Route Frequency Ordered Stop   05/25/17 2200  trimethoprim (TRIMPEX) tablet 100 mg     100 mg Oral Daily at bedtime 05/25/17 1047     05/25/17 1400  ceFAZolin (ANCEF) IVPB 2g/100 mL premix     2 g 200 mL/hr over 30 Minutes Intravenous Every 6 hours 05/25/17 1047 05/26/17 0553   05/25/17 0610  ceFAZolin (ANCEF) IVPB 2g/100 mL premix     2 g 200 mL/hr over 30 Minutes Intravenous On call to O.R. 05/25/17 5462 05/25/17 7035    .  Patient underwent the above named procedure and tolerated it well. The following day they were hemodynamically stable and pain was controlled on oral  analgesics. They were neurovascularly intact to the operative extremity. OT was ordered and worked with patient per protocol. They were medically and orthopaedically stable for discharge on day 1 .    DIAGNOSTIC STUDIES:  RECENT RADIOGRAPHIC STUDIES :  No results found.  RECENT VITAL SIGNS:   Patient Vitals for the past 24 hrs:  BP Temp Temp src Pulse Resp SpO2  05/26/17 0604 (!) 146/63 98 F (36.7 C) Oral 86 16 97 %  05/25/17 2013 132/61 99.6 F (37.6 C) Oral 98 16 98 %  .  RECENT EKG RESULTS:    Orders placed or performed during the hospital encounter of 05/18/17  . EKG 12 lead  . EKG 12 lead    DISCHARGE INSTRUCTIONS:    DISCHARGE MEDICATIONS:   Allergies as of 05/26/2017      Reactions   Darvon [propoxyphene] Nausea And Vomiting   Hydroxyzine Other (See Comments)   Dizziness   Meloxicam Nausea And Vomiting, Other (See Comments)   Pt. Stated, "Meloxicam causes Nausea nad vomiting."   Scopolamine Other (See Comments)   Psych. Disturbance ?      Medication List    STOP taking these medications   ALPRAZolam 0.25 MG tablet Commonly known as:  XANAX   promethazine 25 MG tablet Commonly known as:  PHENERGAN     TAKE these medications   acetaminophen 500 MG tablet Commonly known as:  TYLENOL Take 1,000 mg by mouth every 6 (six) hours as needed for moderate pain or headache.   b complex vitamins tablet Take 1 tablet by mouth daily.   CALCIUM-MAGNESIUM-ZINC PO Take 1 tablet by mouth at bedtime.   diazepam 5 MG tablet Commonly known as:  VALIUM Take 0.5-1 tablets (2.5-5 mg total) by mouth every 6 (six) hours as needed for muscle spasms or sedation.   EYE VITAMINS & MINERALS PO Take 1 capsule by mouth daily.   HYDROcodone-acetaminophen 10-325 MG tablet Commonly known as:  NORCO Take 1 tablet by mouth every 4 (four) hours as needed. What changed:  when to take this   ibuprofen 600 MG tablet Commonly known as:  ADVIL,MOTRIN Take 600 mg by mouth 2 (two)  times daily.   losartan 100 MG tablet Commonly known as:  COZAAR Take 100 mg by mouth at bedtime.   metFORMIN 500 MG tablet Commonly known as:  GLUCOPHAGE Take 500 mg by mouth daily with breakfast.   metoprolol succinate 50 MG 24 hr tablet Commonly known as:  TOPROL-XL Take 25 mg by mouth daily.   MYRBETRIQ 50 MG Tb24 tablet Generic drug:  mirabegron ER Take 50 mg by mouth daily.   omeprazole 20 MG capsule Commonly known as:  PRILOSEC Take 20 mg by mouth at bedtime.   ondansetron 4 MG tablet Commonly known as:  ZOFRAN Take 1 tablet (4 mg total) by mouth every 8 (eight) hours as needed for nausea or vomiting.   simvastatin 20 MG tablet Commonly known as:  ZOCOR Take 20 mg by mouth at bedtime.   traMADol 50 MG tablet Commonly known as:  ULTRAM Take 50 mg by mouth at bedtime as needed for moderate pain.   trimethoprim 100 MG tablet Commonly known as:  TRIMPEX Take 100 mg by mouth at bedtime.   venlafaxine XR 150 MG 24 hr capsule Commonly known as:  EFFEXOR-XR Take 150 mg by mouth daily with breakfast.   Vitamin D3 1000 units Caps Take 1,000 Units by mouth daily.       FOLLOW UP VISIT:   Follow-up Information    Justice Britain, MD.   Specialty:  Orthopedic Surgery Why:  call to be seen in 10-14 days Contact information: [address] [place] [address] [phone]           DISCHARGE TO: Home   DISCHARGE CONDITION:  Stacey Santana for Dr. Justice Britain 05/26/2017, 10:58 AM

## 2017-05-30 NOTE — Anesthesia Postprocedure Evaluation (Signed)
Anesthesia Post Note  Patient: Stacey Santana  Procedure(s) Performed: LEFT TOTAL SHOULDER ARTHROPLASTY (Left Shoulder)     Patient location during evaluation: PACU Anesthesia Type: General Level of consciousness: awake and alert Pain management: pain level controlled Vital Signs Assessment: post-procedure vital signs reviewed and stable Respiratory status: spontaneous breathing, nonlabored ventilation, respiratory function stable and patient connected to nasal cannula oxygen Cardiovascular status: stable and blood pressure returned to baseline Postop Assessment: no apparent nausea or vomiting Anesthetic complications: no    Last Vitals:  Vitals:   05/25/17 2013 05/26/17 0604  BP: 132/61 (!) 146/63  Pulse: 98 86  Resp: 16 16  Temp: 37.6 C 36.7 C  SpO2: 98% 97%    Last Pain:  Vitals:   05/26/17 0957  TempSrc:   PainSc: [place]

## 2017-06-07 DIAGNOSIS — Z96612 Presence of left artificial shoulder joint: Secondary | ICD-10-CM | POA: Diagnosis not present

## 2017-06-07 DIAGNOSIS — Z471 Aftercare following joint replacement surgery: Secondary | ICD-10-CM | POA: Diagnosis not present

## 2017-06-22 DIAGNOSIS — F3342 Major depressive disorder, recurrent, in full remission: Secondary | ICD-10-CM | POA: Diagnosis not present

## 2017-06-22 DIAGNOSIS — N3941 Urge incontinence: Secondary | ICD-10-CM | POA: Diagnosis not present

## 2017-06-22 DIAGNOSIS — Z0000 Encounter for general adult medical examination without abnormal findings: Secondary | ICD-10-CM | POA: Diagnosis not present

## 2017-06-22 DIAGNOSIS — G4733 Obstructive sleep apnea (adult) (pediatric): Secondary | ICD-10-CM | POA: Diagnosis not present

## 2017-06-22 DIAGNOSIS — M25512 Pain in left shoulder: Secondary | ICD-10-CM | POA: Diagnosis not present

## 2017-06-22 DIAGNOSIS — I10 Essential (primary) hypertension: Secondary | ICD-10-CM | POA: Diagnosis not present

## 2017-06-22 DIAGNOSIS — Z96612 Presence of left artificial shoulder joint: Secondary | ICD-10-CM | POA: Diagnosis not present

## 2017-06-22 DIAGNOSIS — E559 Vitamin D deficiency, unspecified: Secondary | ICD-10-CM | POA: Diagnosis not present

## 2017-06-22 DIAGNOSIS — E785 Hyperlipidemia, unspecified: Secondary | ICD-10-CM | POA: Diagnosis not present

## 2017-06-22 DIAGNOSIS — M85852 Other specified disorders of bone density and structure, left thigh: Secondary | ICD-10-CM | POA: Diagnosis not present

## 2017-06-22 DIAGNOSIS — Z79899 Other long term (current) drug therapy: Secondary | ICD-10-CM | POA: Diagnosis not present

## 2017-06-22 DIAGNOSIS — E119 Type 2 diabetes mellitus without complications: Secondary | ICD-10-CM | POA: Diagnosis not present

## 2017-06-27 DIAGNOSIS — M25512 Pain in left shoulder: Secondary | ICD-10-CM | POA: Diagnosis not present

## 2017-06-30 DIAGNOSIS — M25512 Pain in left shoulder: Secondary | ICD-10-CM | POA: Diagnosis not present

## 2017-07-04 DIAGNOSIS — M25512 Pain in left shoulder: Secondary | ICD-10-CM | POA: Diagnosis not present

## 2017-07-07 DIAGNOSIS — M25512 Pain in left shoulder: Secondary | ICD-10-CM | POA: Diagnosis not present

## 2017-07-10 DIAGNOSIS — M25512 Pain in left shoulder: Secondary | ICD-10-CM | POA: Diagnosis not present

## 2017-07-14 DIAGNOSIS — M25512 Pain in left shoulder: Secondary | ICD-10-CM | POA: Diagnosis not present

## 2017-07-18 DIAGNOSIS — M25512 Pain in left shoulder: Secondary | ICD-10-CM | POA: Diagnosis not present

## 2017-07-21 DIAGNOSIS — M25512 Pain in left shoulder: Secondary | ICD-10-CM | POA: Diagnosis not present

## 2017-07-25 DIAGNOSIS — M25512 Pain in left shoulder: Secondary | ICD-10-CM | POA: Diagnosis not present

## 2017-08-01 DIAGNOSIS — M25512 Pain in left shoulder: Secondary | ICD-10-CM | POA: Diagnosis not present

## 2017-08-04 DIAGNOSIS — M25512 Pain in left shoulder: Secondary | ICD-10-CM | POA: Diagnosis not present

## 2017-08-08 DIAGNOSIS — M25512 Pain in left shoulder: Secondary | ICD-10-CM | POA: Diagnosis not present

## 2017-08-11 DIAGNOSIS — M25512 Pain in left shoulder: Secondary | ICD-10-CM | POA: Diagnosis not present

## 2017-08-15 DIAGNOSIS — M25512 Pain in left shoulder: Secondary | ICD-10-CM | POA: Diagnosis not present

## 2017-08-17 DIAGNOSIS — M25512 Pain in left shoulder: Secondary | ICD-10-CM | POA: Diagnosis not present

## 2017-08-21 DIAGNOSIS — Z96612 Presence of left artificial shoulder joint: Secondary | ICD-10-CM | POA: Diagnosis not present

## 2017-08-21 DIAGNOSIS — Z471 Aftercare following joint replacement surgery: Secondary | ICD-10-CM | POA: Diagnosis not present

## 2017-08-28 DIAGNOSIS — M25512 Pain in left shoulder: Secondary | ICD-10-CM | POA: Diagnosis not present

## 2017-09-08 DIAGNOSIS — M25512 Pain in left shoulder: Secondary | ICD-10-CM | POA: Diagnosis not present

## 2017-09-15 DIAGNOSIS — M25512 Pain in left shoulder: Secondary | ICD-10-CM | POA: Diagnosis not present

## 2017-09-15 DIAGNOSIS — F3341 Major depressive disorder, recurrent, in partial remission: Secondary | ICD-10-CM | POA: Diagnosis not present

## 2017-09-21 DIAGNOSIS — M25512 Pain in left shoulder: Secondary | ICD-10-CM | POA: Diagnosis not present

## 2017-09-28 DIAGNOSIS — M25512 Pain in left shoulder: Secondary | ICD-10-CM | POA: Diagnosis not present

## 2017-10-02 DIAGNOSIS — M8588 Other specified disorders of bone density and structure, other site: Secondary | ICD-10-CM | POA: Diagnosis not present

## 2017-10-23 ENCOUNTER — Other Ambulatory Visit: Payer: Self-pay | Admitting: Family Medicine

## 2017-10-23 DIAGNOSIS — Z1231 Encounter for screening mammogram for malignant neoplasm of breast: Secondary | ICD-10-CM

## 2017-12-01 DIAGNOSIS — Z96612 Presence of left artificial shoulder joint: Secondary | ICD-10-CM | POA: Diagnosis not present

## 2017-12-01 DIAGNOSIS — M19012 Primary osteoarthritis, left shoulder: Secondary | ICD-10-CM | POA: Diagnosis not present

## 2017-12-01 DIAGNOSIS — Z471 Aftercare following joint replacement surgery: Secondary | ICD-10-CM | POA: Diagnosis not present

## 2017-12-07 ENCOUNTER — Ambulatory Visit
Admission: RE | Admit: 2017-12-07 | Discharge: 2017-12-07 | Disposition: A | Discharge status: Home / Self Care | Payer: Medicare Other | Source: Ambulatory Visit | Attending: Family Medicine | Admitting: Family Medicine

## 2017-12-07 DIAGNOSIS — Z1231 Encounter for screening mammogram for malignant neoplasm of breast: Secondary | ICD-10-CM | POA: Diagnosis not present

## 2017-12-12 DIAGNOSIS — Z111 Encounter for screening for respiratory tuberculosis: Secondary | ICD-10-CM | POA: Diagnosis not present

## 2017-12-26 DIAGNOSIS — E559 Vitamin D deficiency, unspecified: Secondary | ICD-10-CM | POA: Diagnosis not present

## 2017-12-26 DIAGNOSIS — M85852 Other specified disorders of bone density and structure, left thigh: Secondary | ICD-10-CM | POA: Diagnosis not present

## 2017-12-26 DIAGNOSIS — K219 Gastro-esophageal reflux disease without esophagitis: Secondary | ICD-10-CM | POA: Diagnosis not present

## 2017-12-26 DIAGNOSIS — Z79899 Other long term (current) drug therapy: Secondary | ICD-10-CM | POA: Diagnosis not present

## 2017-12-26 DIAGNOSIS — E785 Hyperlipidemia, unspecified: Secondary | ICD-10-CM | POA: Diagnosis not present

## 2017-12-26 DIAGNOSIS — G4733 Obstructive sleep apnea (adult) (pediatric): Secondary | ICD-10-CM | POA: Diagnosis not present

## 2017-12-26 DIAGNOSIS — E119 Type 2 diabetes mellitus without complications: Secondary | ICD-10-CM | POA: Diagnosis not present

## 2017-12-26 DIAGNOSIS — F3342 Major depressive disorder, recurrent, in full remission: Secondary | ICD-10-CM | POA: Diagnosis not present

## 2017-12-26 DIAGNOSIS — Z96612 Presence of left artificial shoulder joint: Secondary | ICD-10-CM | POA: Diagnosis not present

## 2017-12-26 DIAGNOSIS — N3946 Mixed incontinence: Secondary | ICD-10-CM | POA: Diagnosis not present

## 2017-12-26 DIAGNOSIS — I10 Essential (primary) hypertension: Secondary | ICD-10-CM | POA: Diagnosis not present

## 2017-12-28 DIAGNOSIS — N3941 Urge incontinence: Secondary | ICD-10-CM | POA: Diagnosis not present

## 2017-12-28 DIAGNOSIS — Z7984 Long term (current) use of oral hypoglycemic drugs: Secondary | ICD-10-CM | POA: Diagnosis not present

## 2017-12-28 DIAGNOSIS — F3342 Major depressive disorder, recurrent, in full remission: Secondary | ICD-10-CM | POA: Diagnosis not present

## 2017-12-28 DIAGNOSIS — G4733 Obstructive sleep apnea (adult) (pediatric): Secondary | ICD-10-CM | POA: Diagnosis not present

## 2017-12-28 DIAGNOSIS — E785 Hyperlipidemia, unspecified: Secondary | ICD-10-CM | POA: Diagnosis not present

## 2017-12-28 DIAGNOSIS — M85852 Other specified disorders of bone density and structure, left thigh: Secondary | ICD-10-CM | POA: Diagnosis not present

## 2017-12-28 DIAGNOSIS — E559 Vitamin D deficiency, unspecified: Secondary | ICD-10-CM | POA: Diagnosis not present

## 2017-12-28 DIAGNOSIS — Z96612 Presence of left artificial shoulder joint: Secondary | ICD-10-CM | POA: Diagnosis not present

## 2017-12-28 DIAGNOSIS — I10 Essential (primary) hypertension: Secondary | ICD-10-CM | POA: Diagnosis not present

## 2017-12-28 DIAGNOSIS — K2270 Barrett's esophagus without dysplasia: Secondary | ICD-10-CM | POA: Diagnosis not present

## 2017-12-28 DIAGNOSIS — E119 Type 2 diabetes mellitus without complications: Secondary | ICD-10-CM | POA: Diagnosis not present

## 2018-01-08 DIAGNOSIS — F3341 Major depressive disorder, recurrent, in partial remission: Secondary | ICD-10-CM | POA: Diagnosis not present

## 2018-02-02 DIAGNOSIS — G5701 Lesion of sciatic nerve, right lower limb: Secondary | ICD-10-CM | POA: Diagnosis not present

## 2018-02-02 DIAGNOSIS — Z23 Encounter for immunization: Secondary | ICD-10-CM | POA: Diagnosis not present

## 2018-02-15 DIAGNOSIS — G4733 Obstructive sleep apnea (adult) (pediatric): Secondary | ICD-10-CM | POA: Diagnosis not present

## 2018-03-12 DIAGNOSIS — M545 Low back pain: Secondary | ICD-10-CM | POA: Diagnosis not present

## 2018-03-12 DIAGNOSIS — S76312D Strain of muscle, fascia and tendon of the posterior muscle group at thigh level, left thigh, subsequent encounter: Secondary | ICD-10-CM | POA: Diagnosis not present

## 2018-03-12 DIAGNOSIS — M5416 Radiculopathy, lumbar region: Secondary | ICD-10-CM | POA: Diagnosis not present

## 2018-03-12 DIAGNOSIS — R531 Weakness: Secondary | ICD-10-CM | POA: Diagnosis not present

## 2018-03-13 DIAGNOSIS — N390 Urinary tract infection, site not specified: Secondary | ICD-10-CM | POA: Diagnosis not present

## 2018-03-13 DIAGNOSIS — N3941 Urge incontinence: Secondary | ICD-10-CM | POA: Diagnosis not present

## 2018-03-15 DIAGNOSIS — M545 Low back pain: Secondary | ICD-10-CM | POA: Diagnosis not present

## 2018-03-15 DIAGNOSIS — H353131 Nonexudative age-related macular degeneration, bilateral, early dry stage: Secondary | ICD-10-CM | POA: Diagnosis not present

## 2018-03-15 DIAGNOSIS — R531 Weakness: Secondary | ICD-10-CM | POA: Diagnosis not present

## 2018-03-15 DIAGNOSIS — F3341 Major depressive disorder, recurrent, in partial remission: Secondary | ICD-10-CM | POA: Diagnosis not present

## 2018-03-15 DIAGNOSIS — S76312D Strain of muscle, fascia and tendon of the posterior muscle group at thigh level, left thigh, subsequent encounter: Secondary | ICD-10-CM | POA: Diagnosis not present

## 2018-03-15 DIAGNOSIS — M5416 Radiculopathy, lumbar region: Secondary | ICD-10-CM | POA: Diagnosis not present

## 2018-03-15 DIAGNOSIS — H2513 Age-related nuclear cataract, bilateral: Secondary | ICD-10-CM | POA: Diagnosis not present

## 2018-03-15 DIAGNOSIS — E119 Type 2 diabetes mellitus without complications: Secondary | ICD-10-CM | POA: Diagnosis not present

## 2018-03-21 DIAGNOSIS — S76312D Strain of muscle, fascia and tendon of the posterior muscle group at thigh level, left thigh, subsequent encounter: Secondary | ICD-10-CM | POA: Diagnosis not present

## 2018-03-21 DIAGNOSIS — M545 Low back pain: Secondary | ICD-10-CM | POA: Diagnosis not present

## 2018-03-21 DIAGNOSIS — R531 Weakness: Secondary | ICD-10-CM | POA: Diagnosis not present

## 2018-03-21 DIAGNOSIS — M5416 Radiculopathy, lumbar region: Secondary | ICD-10-CM | POA: Diagnosis not present

## 2018-03-23 DIAGNOSIS — S76312D Strain of muscle, fascia and tendon of the posterior muscle group at thigh level, left thigh, subsequent encounter: Secondary | ICD-10-CM | POA: Diagnosis not present

## 2018-03-23 DIAGNOSIS — M5416 Radiculopathy, lumbar region: Secondary | ICD-10-CM | POA: Diagnosis not present

## 2018-03-23 DIAGNOSIS — R531 Weakness: Secondary | ICD-10-CM | POA: Diagnosis not present

## 2018-03-23 DIAGNOSIS — M545 Low back pain: Secondary | ICD-10-CM | POA: Diagnosis not present

## 2018-03-26 DIAGNOSIS — M545 Low back pain: Secondary | ICD-10-CM | POA: Diagnosis not present

## 2018-03-26 DIAGNOSIS — S76312D Strain of muscle, fascia and tendon of the posterior muscle group at thigh level, left thigh, subsequent encounter: Secondary | ICD-10-CM | POA: Diagnosis not present

## 2018-03-26 DIAGNOSIS — R531 Weakness: Secondary | ICD-10-CM | POA: Diagnosis not present

## 2018-03-26 DIAGNOSIS — M5416 Radiculopathy, lumbar region: Secondary | ICD-10-CM | POA: Diagnosis not present

## 2018-03-28 DIAGNOSIS — M545 Low back pain: Secondary | ICD-10-CM | POA: Diagnosis not present

## 2018-03-28 DIAGNOSIS — R531 Weakness: Secondary | ICD-10-CM | POA: Diagnosis not present

## 2018-03-28 DIAGNOSIS — M5416 Radiculopathy, lumbar region: Secondary | ICD-10-CM | POA: Diagnosis not present

## 2018-03-28 DIAGNOSIS — S76312D Strain of muscle, fascia and tendon of the posterior muscle group at thigh level, left thigh, subsequent encounter: Secondary | ICD-10-CM | POA: Diagnosis not present

## 2018-05-28 DIAGNOSIS — Z471 Aftercare following joint replacement surgery: Secondary | ICD-10-CM | POA: Diagnosis not present

## 2018-05-28 DIAGNOSIS — Z96612 Presence of left artificial shoulder joint: Secondary | ICD-10-CM | POA: Diagnosis not present

## 2018-07-02 DIAGNOSIS — F3342 Major depressive disorder, recurrent, in full remission: Secondary | ICD-10-CM | POA: Diagnosis not present

## 2018-07-02 DIAGNOSIS — Z0000 Encounter for general adult medical examination without abnormal findings: Secondary | ICD-10-CM | POA: Diagnosis not present

## 2018-07-02 DIAGNOSIS — E785 Hyperlipidemia, unspecified: Secondary | ICD-10-CM | POA: Diagnosis not present

## 2018-07-02 DIAGNOSIS — Z96612 Presence of left artificial shoulder joint: Secondary | ICD-10-CM | POA: Diagnosis not present

## 2018-07-02 DIAGNOSIS — Z79899 Other long term (current) drug therapy: Secondary | ICD-10-CM | POA: Diagnosis not present

## 2018-07-02 DIAGNOSIS — E559 Vitamin D deficiency, unspecified: Secondary | ICD-10-CM | POA: Diagnosis not present

## 2018-07-02 DIAGNOSIS — N3941 Urge incontinence: Secondary | ICD-10-CM | POA: Diagnosis not present

## 2018-07-02 DIAGNOSIS — E119 Type 2 diabetes mellitus without complications: Secondary | ICD-10-CM | POA: Diagnosis not present

## 2018-07-02 DIAGNOSIS — G4733 Obstructive sleep apnea (adult) (pediatric): Secondary | ICD-10-CM | POA: Diagnosis not present

## 2018-07-02 DIAGNOSIS — I10 Essential (primary) hypertension: Secondary | ICD-10-CM | POA: Diagnosis not present

## 2018-07-02 DIAGNOSIS — M85852 Other specified disorders of bone density and structure, left thigh: Secondary | ICD-10-CM | POA: Diagnosis not present

## 2018-07-03 DIAGNOSIS — M7918 Myalgia, other site: Secondary | ICD-10-CM | POA: Diagnosis not present

## 2018-07-03 DIAGNOSIS — I10 Essential (primary) hypertension: Secondary | ICD-10-CM | POA: Diagnosis not present

## 2018-07-03 DIAGNOSIS — E119 Type 2 diabetes mellitus without complications: Secondary | ICD-10-CM | POA: Diagnosis not present

## 2018-07-03 DIAGNOSIS — Z0000 Encounter for general adult medical examination without abnormal findings: Secondary | ICD-10-CM | POA: Diagnosis not present

## 2018-07-03 DIAGNOSIS — R21 Rash and other nonspecific skin eruption: Secondary | ICD-10-CM | POA: Diagnosis not present

## 2018-07-03 DIAGNOSIS — N3941 Urge incontinence: Secondary | ICD-10-CM | POA: Diagnosis not present

## 2018-07-03 DIAGNOSIS — M19019 Primary osteoarthritis, unspecified shoulder: Secondary | ICD-10-CM | POA: Diagnosis not present

## 2018-07-03 DIAGNOSIS — G4733 Obstructive sleep apnea (adult) (pediatric): Secondary | ICD-10-CM | POA: Diagnosis not present

## 2018-07-03 DIAGNOSIS — F3342 Major depressive disorder, recurrent, in full remission: Secondary | ICD-10-CM | POA: Diagnosis not present

## 2018-07-03 DIAGNOSIS — E559 Vitamin D deficiency, unspecified: Secondary | ICD-10-CM | POA: Diagnosis not present

## 2018-07-03 DIAGNOSIS — E785 Hyperlipidemia, unspecified: Secondary | ICD-10-CM | POA: Diagnosis not present

## 2018-07-03 DIAGNOSIS — K2270 Barrett's esophagus without dysplasia: Secondary | ICD-10-CM | POA: Diagnosis not present

## 2018-08-09 DIAGNOSIS — F3341 Major depressive disorder, recurrent, in partial remission: Secondary | ICD-10-CM | POA: Diagnosis not present

## 2018-11-27 DIAGNOSIS — H52202 Unspecified astigmatism, left eye: Secondary | ICD-10-CM | POA: Diagnosis not present

## 2018-11-27 DIAGNOSIS — E119 Type 2 diabetes mellitus without complications: Secondary | ICD-10-CM | POA: Diagnosis not present

## 2018-11-27 DIAGNOSIS — H353131 Nonexudative age-related macular degeneration, bilateral, early dry stage: Secondary | ICD-10-CM | POA: Diagnosis not present

## 2018-11-27 DIAGNOSIS — H2513 Age-related nuclear cataract, bilateral: Secondary | ICD-10-CM | POA: Diagnosis not present

## 2018-11-27 DIAGNOSIS — H5212 Myopia, left eye: Secondary | ICD-10-CM | POA: Diagnosis not present

## 2018-11-27 DIAGNOSIS — H5211 Myopia, right eye: Secondary | ICD-10-CM | POA: Diagnosis not present

## 2018-11-27 DIAGNOSIS — H524 Presbyopia: Secondary | ICD-10-CM | POA: Diagnosis not present

## 2019-01-04 DIAGNOSIS — E119 Type 2 diabetes mellitus without complications: Secondary | ICD-10-CM | POA: Diagnosis not present

## 2019-01-11 DIAGNOSIS — I10 Essential (primary) hypertension: Secondary | ICD-10-CM | POA: Diagnosis not present

## 2019-01-11 DIAGNOSIS — G4733 Obstructive sleep apnea (adult) (pediatric): Secondary | ICD-10-CM | POA: Diagnosis not present

## 2019-01-11 DIAGNOSIS — F331 Major depressive disorder, recurrent, moderate: Secondary | ICD-10-CM | POA: Diagnosis not present

## 2019-01-11 DIAGNOSIS — M19019 Primary osteoarthritis, unspecified shoulder: Secondary | ICD-10-CM | POA: Diagnosis not present

## 2019-01-11 DIAGNOSIS — N3289 Other specified disorders of bladder: Secondary | ICD-10-CM | POA: Diagnosis not present

## 2019-01-11 DIAGNOSIS — E559 Vitamin D deficiency, unspecified: Secondary | ICD-10-CM | POA: Diagnosis not present

## 2019-01-11 DIAGNOSIS — E785 Hyperlipidemia, unspecified: Secondary | ICD-10-CM | POA: Diagnosis not present

## 2019-01-11 DIAGNOSIS — E1159 Type 2 diabetes mellitus with other circulatory complications: Secondary | ICD-10-CM | POA: Diagnosis not present

## 2019-01-11 DIAGNOSIS — N390 Urinary tract infection, site not specified: Secondary | ICD-10-CM | POA: Diagnosis not present

## 2019-01-11 DIAGNOSIS — N3941 Urge incontinence: Secondary | ICD-10-CM | POA: Diagnosis not present

## 2019-01-11 DIAGNOSIS — K219 Gastro-esophageal reflux disease without esophagitis: Secondary | ICD-10-CM | POA: Diagnosis not present

## 2019-01-22 DIAGNOSIS — F3341 Major depressive disorder, recurrent, in partial remission: Secondary | ICD-10-CM | POA: Diagnosis not present

## 2019-01-28 ENCOUNTER — Other Ambulatory Visit: Payer: Self-pay | Admitting: Family Medicine

## 2019-01-28 DIAGNOSIS — Z1231 Encounter for screening mammogram for malignant neoplasm of breast: Secondary | ICD-10-CM

## 2019-02-05 DIAGNOSIS — Z23 Encounter for immunization: Secondary | ICD-10-CM | POA: Diagnosis not present

## 2019-02-19 DIAGNOSIS — G4733 Obstructive sleep apnea (adult) (pediatric): Secondary | ICD-10-CM | POA: Diagnosis not present

## 2019-02-19 DIAGNOSIS — G4721 Circadian rhythm sleep disorder, delayed sleep phase type: Secondary | ICD-10-CM | POA: Diagnosis not present

## 2019-02-28 DIAGNOSIS — F3341 Major depressive disorder, recurrent, in partial remission: Secondary | ICD-10-CM | POA: Diagnosis not present

## 2019-03-15 ENCOUNTER — Ambulatory Visit
Admission: RE | Admit: 2019-03-15 | Discharge: 2019-03-15 | Disposition: A | Discharge status: Home / Self Care | Payer: Medicare Other | Source: Ambulatory Visit | Attending: Family Medicine | Admitting: Family Medicine

## 2019-03-15 ENCOUNTER — Ambulatory Visit: Payer: Medicare Other

## 2019-03-15 ENCOUNTER — Other Ambulatory Visit: Payer: Self-pay

## 2019-03-15 DIAGNOSIS — Z1231 Encounter for screening mammogram for malignant neoplasm of breast: Secondary | ICD-10-CM | POA: Diagnosis not present

## 2019-03-22 DIAGNOSIS — Z20828 Contact with and (suspected) exposure to other viral communicable diseases: Secondary | ICD-10-CM | POA: Diagnosis not present

## 2019-04-02 DIAGNOSIS — N390 Urinary tract infection, site not specified: Secondary | ICD-10-CM | POA: Diagnosis not present

## 2019-04-02 DIAGNOSIS — N3941 Urge incontinence: Secondary | ICD-10-CM | POA: Diagnosis not present

## 2019-04-02 DIAGNOSIS — R82998 Other abnormal findings in urine: Secondary | ICD-10-CM | POA: Diagnosis not present

## 2019-05-27 DIAGNOSIS — F3341 Major depressive disorder, recurrent, in partial remission: Secondary | ICD-10-CM | POA: Diagnosis not present

## 2019-06-07 DIAGNOSIS — F331 Major depressive disorder, recurrent, moderate: Secondary | ICD-10-CM | POA: Diagnosis not present

## 2019-06-07 DIAGNOSIS — E785 Hyperlipidemia, unspecified: Secondary | ICD-10-CM | POA: Diagnosis not present

## 2019-06-07 DIAGNOSIS — E1159 Type 2 diabetes mellitus with other circulatory complications: Secondary | ICD-10-CM | POA: Diagnosis not present

## 2019-06-07 DIAGNOSIS — I10 Essential (primary) hypertension: Secondary | ICD-10-CM | POA: Diagnosis not present

## 2019-06-07 DIAGNOSIS — E119 Type 2 diabetes mellitus without complications: Secondary | ICD-10-CM | POA: Diagnosis not present

## 2019-06-07 DIAGNOSIS — M19019 Primary osteoarthritis, unspecified shoulder: Secondary | ICD-10-CM | POA: Diagnosis not present

## 2019-06-07 DIAGNOSIS — M1990 Unspecified osteoarthritis, unspecified site: Secondary | ICD-10-CM | POA: Diagnosis not present

## 2019-06-07 DIAGNOSIS — F3342 Major depressive disorder, recurrent, in full remission: Secondary | ICD-10-CM | POA: Diagnosis not present

## 2019-06-07 DIAGNOSIS — F339 Major depressive disorder, recurrent, unspecified: Secondary | ICD-10-CM | POA: Diagnosis not present

## 2019-07-04 DIAGNOSIS — E559 Vitamin D deficiency, unspecified: Secondary | ICD-10-CM | POA: Diagnosis not present

## 2019-07-04 DIAGNOSIS — G4733 Obstructive sleep apnea (adult) (pediatric): Secondary | ICD-10-CM | POA: Diagnosis not present

## 2019-07-04 DIAGNOSIS — E1159 Type 2 diabetes mellitus with other circulatory complications: Secondary | ICD-10-CM | POA: Diagnosis not present

## 2019-07-04 DIAGNOSIS — N390 Urinary tract infection, site not specified: Secondary | ICD-10-CM | POA: Diagnosis not present

## 2019-07-04 DIAGNOSIS — R300 Dysuria: Secondary | ICD-10-CM | POA: Diagnosis not present

## 2019-07-04 DIAGNOSIS — K219 Gastro-esophageal reflux disease without esophagitis: Secondary | ICD-10-CM | POA: Diagnosis not present

## 2019-07-04 DIAGNOSIS — M19019 Primary osteoarthritis, unspecified shoulder: Secondary | ICD-10-CM | POA: Diagnosis not present

## 2019-07-04 DIAGNOSIS — I10 Essential (primary) hypertension: Secondary | ICD-10-CM | POA: Diagnosis not present

## 2019-07-04 DIAGNOSIS — E119 Type 2 diabetes mellitus without complications: Secondary | ICD-10-CM | POA: Diagnosis not present

## 2019-07-04 DIAGNOSIS — E785 Hyperlipidemia, unspecified: Secondary | ICD-10-CM | POA: Diagnosis not present

## 2019-07-04 DIAGNOSIS — N3941 Urge incontinence: Secondary | ICD-10-CM | POA: Diagnosis not present

## 2019-07-04 DIAGNOSIS — F331 Major depressive disorder, recurrent, moderate: Secondary | ICD-10-CM | POA: Diagnosis not present

## 2019-07-08 DIAGNOSIS — I10 Essential (primary) hypertension: Secondary | ICD-10-CM | POA: Diagnosis not present

## 2019-07-08 DIAGNOSIS — N390 Urinary tract infection, site not specified: Secondary | ICD-10-CM | POA: Diagnosis not present

## 2019-07-08 DIAGNOSIS — E559 Vitamin D deficiency, unspecified: Secondary | ICD-10-CM | POA: Diagnosis not present

## 2019-07-08 DIAGNOSIS — K219 Gastro-esophageal reflux disease without esophagitis: Secondary | ICD-10-CM | POA: Diagnosis not present

## 2019-07-08 DIAGNOSIS — Z0000 Encounter for general adult medical examination without abnormal findings: Secondary | ICD-10-CM | POA: Diagnosis not present

## 2019-07-08 DIAGNOSIS — G4733 Obstructive sleep apnea (adult) (pediatric): Secondary | ICD-10-CM | POA: Diagnosis not present

## 2019-07-08 DIAGNOSIS — Z23 Encounter for immunization: Secondary | ICD-10-CM | POA: Diagnosis not present

## 2019-07-08 DIAGNOSIS — E1159 Type 2 diabetes mellitus with other circulatory complications: Secondary | ICD-10-CM | POA: Diagnosis not present

## 2019-07-08 DIAGNOSIS — E785 Hyperlipidemia, unspecified: Secondary | ICD-10-CM | POA: Diagnosis not present

## 2019-07-08 DIAGNOSIS — F3342 Major depressive disorder, recurrent, in full remission: Secondary | ICD-10-CM | POA: Diagnosis not present

## 2019-07-08 DIAGNOSIS — R5383 Other fatigue: Secondary | ICD-10-CM | POA: Diagnosis not present

## 2019-07-08 DIAGNOSIS — N3941 Urge incontinence: Secondary | ICD-10-CM | POA: Diagnosis not present

## 2019-07-23 DIAGNOSIS — N3941 Urge incontinence: Secondary | ICD-10-CM | POA: Diagnosis not present

## 2019-07-23 DIAGNOSIS — R3989 Other symptoms and signs involving the genitourinary system: Secondary | ICD-10-CM | POA: Diagnosis not present

## 2019-07-23 DIAGNOSIS — N390 Urinary tract infection, site not specified: Secondary | ICD-10-CM | POA: Diagnosis not present

## 2019-08-13 DIAGNOSIS — N3281 Overactive bladder: Secondary | ICD-10-CM | POA: Diagnosis not present

## 2019-08-13 DIAGNOSIS — N390 Urinary tract infection, site not specified: Secondary | ICD-10-CM | POA: Diagnosis not present

## 2019-08-13 DIAGNOSIS — N3941 Urge incontinence: Secondary | ICD-10-CM | POA: Diagnosis not present

## 2019-08-13 DIAGNOSIS — R300 Dysuria: Secondary | ICD-10-CM | POA: Diagnosis not present

## 2019-08-13 DIAGNOSIS — N393 Stress incontinence (female) (male): Secondary | ICD-10-CM | POA: Diagnosis not present

## 2019-09-23 DIAGNOSIS — F3341 Major depressive disorder, recurrent, in partial remission: Secondary | ICD-10-CM | POA: Diagnosis not present

## 2019-10-08 DIAGNOSIS — E1159 Type 2 diabetes mellitus with other circulatory complications: Secondary | ICD-10-CM | POA: Diagnosis not present

## 2019-10-08 DIAGNOSIS — R5383 Other fatigue: Secondary | ICD-10-CM | POA: Diagnosis not present

## 2019-10-08 DIAGNOSIS — Z0000 Encounter for general adult medical examination without abnormal findings: Secondary | ICD-10-CM | POA: Diagnosis not present

## 2019-10-08 DIAGNOSIS — G4733 Obstructive sleep apnea (adult) (pediatric): Secondary | ICD-10-CM | POA: Diagnosis not present

## 2019-10-08 DIAGNOSIS — N3941 Urge incontinence: Secondary | ICD-10-CM | POA: Diagnosis not present

## 2019-10-08 DIAGNOSIS — I10 Essential (primary) hypertension: Secondary | ICD-10-CM | POA: Diagnosis not present

## 2019-10-08 DIAGNOSIS — E785 Hyperlipidemia, unspecified: Secondary | ICD-10-CM | POA: Diagnosis not present

## 2019-10-08 DIAGNOSIS — Z7984 Long term (current) use of oral hypoglycemic drugs: Secondary | ICD-10-CM | POA: Diagnosis not present

## 2019-10-29 DIAGNOSIS — F331 Major depressive disorder, recurrent, moderate: Secondary | ICD-10-CM | POA: Diagnosis not present

## 2019-10-29 DIAGNOSIS — F339 Major depressive disorder, recurrent, unspecified: Secondary | ICD-10-CM | POA: Diagnosis not present

## 2019-10-29 DIAGNOSIS — E785 Hyperlipidemia, unspecified: Secondary | ICD-10-CM | POA: Diagnosis not present

## 2019-10-29 DIAGNOSIS — E119 Type 2 diabetes mellitus without complications: Secondary | ICD-10-CM | POA: Diagnosis not present

## 2019-10-29 DIAGNOSIS — F3342 Major depressive disorder, recurrent, in full remission: Secondary | ICD-10-CM | POA: Diagnosis not present

## 2019-10-29 DIAGNOSIS — M19019 Primary osteoarthritis, unspecified shoulder: Secondary | ICD-10-CM | POA: Diagnosis not present

## 2019-10-29 DIAGNOSIS — E1159 Type 2 diabetes mellitus with other circulatory complications: Secondary | ICD-10-CM | POA: Diagnosis not present

## 2019-10-29 DIAGNOSIS — I10 Essential (primary) hypertension: Secondary | ICD-10-CM | POA: Diagnosis not present

## 2019-10-29 DIAGNOSIS — M1990 Unspecified osteoarthritis, unspecified site: Secondary | ICD-10-CM | POA: Diagnosis not present

## 2019-11-25 DIAGNOSIS — F3341 Major depressive disorder, recurrent, in partial remission: Secondary | ICD-10-CM | POA: Diagnosis not present

## 2019-12-02 DIAGNOSIS — H353131 Nonexudative age-related macular degeneration, bilateral, early dry stage: Secondary | ICD-10-CM | POA: Diagnosis not present

## 2019-12-02 DIAGNOSIS — H5212 Myopia, left eye: Secondary | ICD-10-CM | POA: Diagnosis not present

## 2019-12-02 DIAGNOSIS — E119 Type 2 diabetes mellitus without complications: Secondary | ICD-10-CM | POA: Diagnosis not present

## 2019-12-02 DIAGNOSIS — H2513 Age-related nuclear cataract, bilateral: Secondary | ICD-10-CM | POA: Diagnosis not present

## 2019-12-12 DIAGNOSIS — L02416 Cutaneous abscess of left lower limb: Secondary | ICD-10-CM | POA: Diagnosis not present

## 2019-12-20 DIAGNOSIS — L97921 Non-pressure chronic ulcer of unspecified part of left lower leg limited to breakdown of skin: Secondary | ICD-10-CM | POA: Diagnosis not present

## 2019-12-23 ENCOUNTER — Ambulatory Visit (INDEPENDENT_AMBULATORY_CARE_PROVIDER_SITE_OTHER): Payer: Medicare Other | Admitting: Psychology

## 2019-12-23 DIAGNOSIS — F411 Generalized anxiety disorder: Secondary | ICD-10-CM | POA: Diagnosis not present

## 2019-12-23 DIAGNOSIS — E1159 Type 2 diabetes mellitus with other circulatory complications: Secondary | ICD-10-CM | POA: Diagnosis not present

## 2019-12-23 DIAGNOSIS — F331 Major depressive disorder, recurrent, moderate: Secondary | ICD-10-CM

## 2019-12-23 DIAGNOSIS — F3342 Major depressive disorder, recurrent, in full remission: Secondary | ICD-10-CM | POA: Diagnosis not present

## 2019-12-23 DIAGNOSIS — F339 Major depressive disorder, recurrent, unspecified: Secondary | ICD-10-CM | POA: Diagnosis not present

## 2019-12-23 DIAGNOSIS — M19019 Primary osteoarthritis, unspecified shoulder: Secondary | ICD-10-CM | POA: Diagnosis not present

## 2019-12-23 DIAGNOSIS — I10 Essential (primary) hypertension: Secondary | ICD-10-CM | POA: Diagnosis not present

## 2019-12-23 DIAGNOSIS — E785 Hyperlipidemia, unspecified: Secondary | ICD-10-CM | POA: Diagnosis not present

## 2019-12-23 DIAGNOSIS — M1990 Unspecified osteoarthritis, unspecified site: Secondary | ICD-10-CM | POA: Diagnosis not present

## 2019-12-26 DIAGNOSIS — L97922 Non-pressure chronic ulcer of unspecified part of left lower leg with fat layer exposed: Secondary | ICD-10-CM | POA: Diagnosis not present

## 2020-01-08 ENCOUNTER — Ambulatory Visit (INDEPENDENT_AMBULATORY_CARE_PROVIDER_SITE_OTHER): Payer: Medicare Other | Admitting: Psychology

## 2020-01-08 DIAGNOSIS — F411 Generalized anxiety disorder: Secondary | ICD-10-CM

## 2020-01-08 DIAGNOSIS — F331 Major depressive disorder, recurrent, moderate: Secondary | ICD-10-CM

## 2020-01-15 ENCOUNTER — Ambulatory Visit (INDEPENDENT_AMBULATORY_CARE_PROVIDER_SITE_OTHER): Payer: Medicare Other | Admitting: Psychology

## 2020-01-15 DIAGNOSIS — R197 Diarrhea, unspecified: Secondary | ICD-10-CM | POA: Diagnosis not present

## 2020-01-15 DIAGNOSIS — L97922 Non-pressure chronic ulcer of unspecified part of left lower leg with fat layer exposed: Secondary | ICD-10-CM | POA: Diagnosis not present

## 2020-01-15 DIAGNOSIS — F331 Major depressive disorder, recurrent, moderate: Secondary | ICD-10-CM

## 2020-01-15 DIAGNOSIS — F411 Generalized anxiety disorder: Secondary | ICD-10-CM

## 2020-01-16 DIAGNOSIS — F339 Major depressive disorder, recurrent, unspecified: Secondary | ICD-10-CM | POA: Diagnosis not present

## 2020-01-16 DIAGNOSIS — M19019 Primary osteoarthritis, unspecified shoulder: Secondary | ICD-10-CM | POA: Diagnosis not present

## 2020-01-16 DIAGNOSIS — R197 Diarrhea, unspecified: Secondary | ICD-10-CM | POA: Diagnosis not present

## 2020-01-16 DIAGNOSIS — E785 Hyperlipidemia, unspecified: Secondary | ICD-10-CM | POA: Diagnosis not present

## 2020-01-16 DIAGNOSIS — F3342 Major depressive disorder, recurrent, in full remission: Secondary | ICD-10-CM | POA: Diagnosis not present

## 2020-01-16 DIAGNOSIS — E1159 Type 2 diabetes mellitus with other circulatory complications: Secondary | ICD-10-CM | POA: Diagnosis not present

## 2020-01-16 DIAGNOSIS — M1990 Unspecified osteoarthritis, unspecified site: Secondary | ICD-10-CM | POA: Diagnosis not present

## 2020-01-16 DIAGNOSIS — I10 Essential (primary) hypertension: Secondary | ICD-10-CM | POA: Diagnosis not present

## 2020-01-16 DIAGNOSIS — F331 Major depressive disorder, recurrent, moderate: Secondary | ICD-10-CM | POA: Diagnosis not present

## 2020-01-28 ENCOUNTER — Ambulatory Visit (INDEPENDENT_AMBULATORY_CARE_PROVIDER_SITE_OTHER): Payer: Medicare Other | Admitting: Psychology

## 2020-01-28 DIAGNOSIS — F331 Major depressive disorder, recurrent, moderate: Secondary | ICD-10-CM

## 2020-01-28 DIAGNOSIS — F411 Generalized anxiety disorder: Secondary | ICD-10-CM

## 2020-02-04 DIAGNOSIS — L97922 Non-pressure chronic ulcer of unspecified part of left lower leg with fat layer exposed: Secondary | ICD-10-CM | POA: Diagnosis not present

## 2020-02-04 DIAGNOSIS — I10 Essential (primary) hypertension: Secondary | ICD-10-CM | POA: Diagnosis not present

## 2020-02-04 DIAGNOSIS — R197 Diarrhea, unspecified: Secondary | ICD-10-CM | POA: Diagnosis not present

## 2020-02-04 DIAGNOSIS — E785 Hyperlipidemia, unspecified: Secondary | ICD-10-CM | POA: Diagnosis not present

## 2020-02-04 DIAGNOSIS — Z23 Encounter for immunization: Secondary | ICD-10-CM | POA: Diagnosis not present

## 2020-02-04 DIAGNOSIS — G4733 Obstructive sleep apnea (adult) (pediatric): Secondary | ICD-10-CM | POA: Diagnosis not present

## 2020-02-04 DIAGNOSIS — R5383 Other fatigue: Secondary | ICD-10-CM | POA: Diagnosis not present

## 2020-02-04 DIAGNOSIS — N3941 Urge incontinence: Secondary | ICD-10-CM | POA: Diagnosis not present

## 2020-02-04 DIAGNOSIS — Z0000 Encounter for general adult medical examination without abnormal findings: Secondary | ICD-10-CM | POA: Diagnosis not present

## 2020-02-04 DIAGNOSIS — F331 Major depressive disorder, recurrent, moderate: Secondary | ICD-10-CM | POA: Diagnosis not present

## 2020-02-04 DIAGNOSIS — E1159 Type 2 diabetes mellitus with other circulatory complications: Secondary | ICD-10-CM | POA: Diagnosis not present

## 2020-02-06 ENCOUNTER — Ambulatory Visit (INDEPENDENT_AMBULATORY_CARE_PROVIDER_SITE_OTHER): Payer: Medicare Other | Admitting: Psychology

## 2020-02-06 DIAGNOSIS — F411 Generalized anxiety disorder: Secondary | ICD-10-CM | POA: Diagnosis not present

## 2020-02-06 DIAGNOSIS — F331 Major depressive disorder, recurrent, moderate: Secondary | ICD-10-CM | POA: Diagnosis not present

## 2020-02-11 DIAGNOSIS — N3946 Mixed incontinence: Secondary | ICD-10-CM | POA: Diagnosis not present

## 2020-02-11 DIAGNOSIS — N390 Urinary tract infection, site not specified: Secondary | ICD-10-CM | POA: Diagnosis not present

## 2020-02-12 ENCOUNTER — Ambulatory Visit: Payer: No Typology Code available for payment source | Admitting: Psychology

## 2020-02-14 DIAGNOSIS — E1159 Type 2 diabetes mellitus with other circulatory complications: Secondary | ICD-10-CM | POA: Diagnosis not present

## 2020-02-14 DIAGNOSIS — Z79899 Other long term (current) drug therapy: Secondary | ICD-10-CM | POA: Diagnosis not present

## 2020-02-14 DIAGNOSIS — G4733 Obstructive sleep apnea (adult) (pediatric): Secondary | ICD-10-CM | POA: Diagnosis not present

## 2020-02-14 DIAGNOSIS — E785 Hyperlipidemia, unspecified: Secondary | ICD-10-CM | POA: Diagnosis not present

## 2020-02-14 DIAGNOSIS — Z23 Encounter for immunization: Secondary | ICD-10-CM | POA: Diagnosis not present

## 2020-02-14 DIAGNOSIS — M25512 Pain in left shoulder: Secondary | ICD-10-CM | POA: Diagnosis not present

## 2020-02-14 DIAGNOSIS — F331 Major depressive disorder, recurrent, moderate: Secondary | ICD-10-CM | POA: Diagnosis not present

## 2020-02-14 DIAGNOSIS — S90416A Abrasion, unspecified lesser toe(s), initial encounter: Secondary | ICD-10-CM | POA: Diagnosis not present

## 2020-02-14 DIAGNOSIS — L97922 Non-pressure chronic ulcer of unspecified part of left lower leg with fat layer exposed: Secondary | ICD-10-CM | POA: Diagnosis not present

## 2020-02-14 DIAGNOSIS — N3941 Urge incontinence: Secondary | ICD-10-CM | POA: Diagnosis not present

## 2020-02-14 DIAGNOSIS — I10 Essential (primary) hypertension: Secondary | ICD-10-CM | POA: Diagnosis not present

## 2020-02-18 DIAGNOSIS — G4733 Obstructive sleep apnea (adult) (pediatric): Secondary | ICD-10-CM | POA: Diagnosis not present

## 2020-02-24 ENCOUNTER — Other Ambulatory Visit: Payer: Self-pay | Admitting: Family Medicine

## 2020-02-24 DIAGNOSIS — Z1231 Encounter for screening mammogram for malignant neoplasm of breast: Secondary | ICD-10-CM

## 2020-02-25 ENCOUNTER — Ambulatory Visit (INDEPENDENT_AMBULATORY_CARE_PROVIDER_SITE_OTHER): Payer: Medicare Other | Admitting: Psychology

## 2020-02-25 DIAGNOSIS — F331 Major depressive disorder, recurrent, moderate: Secondary | ICD-10-CM | POA: Diagnosis not present

## 2020-02-25 DIAGNOSIS — F411 Generalized anxiety disorder: Secondary | ICD-10-CM

## 2020-03-02 DIAGNOSIS — E1159 Type 2 diabetes mellitus with other circulatory complications: Secondary | ICD-10-CM | POA: Diagnosis not present

## 2020-03-02 DIAGNOSIS — M1990 Unspecified osteoarthritis, unspecified site: Secondary | ICD-10-CM | POA: Diagnosis not present

## 2020-03-02 DIAGNOSIS — E785 Hyperlipidemia, unspecified: Secondary | ICD-10-CM | POA: Diagnosis not present

## 2020-03-02 DIAGNOSIS — M19019 Primary osteoarthritis, unspecified shoulder: Secondary | ICD-10-CM | POA: Diagnosis not present

## 2020-03-02 DIAGNOSIS — F3342 Major depressive disorder, recurrent, in full remission: Secondary | ICD-10-CM | POA: Diagnosis not present

## 2020-03-02 DIAGNOSIS — I10 Essential (primary) hypertension: Secondary | ICD-10-CM | POA: Diagnosis not present

## 2020-03-02 DIAGNOSIS — F331 Major depressive disorder, recurrent, moderate: Secondary | ICD-10-CM | POA: Diagnosis not present

## 2020-03-02 DIAGNOSIS — K219 Gastro-esophageal reflux disease without esophagitis: Secondary | ICD-10-CM | POA: Diagnosis not present

## 2020-03-02 DIAGNOSIS — F339 Major depressive disorder, recurrent, unspecified: Secondary | ICD-10-CM | POA: Diagnosis not present

## 2020-03-04 DIAGNOSIS — Z471 Aftercare following joint replacement surgery: Secondary | ICD-10-CM | POA: Diagnosis not present

## 2020-03-04 DIAGNOSIS — Z96612 Presence of left artificial shoulder joint: Secondary | ICD-10-CM | POA: Diagnosis not present

## 2020-03-16 ENCOUNTER — Ambulatory Visit (INDEPENDENT_AMBULATORY_CARE_PROVIDER_SITE_OTHER): Payer: Medicare Other | Admitting: Psychology

## 2020-03-16 DIAGNOSIS — F411 Generalized anxiety disorder: Secondary | ICD-10-CM

## 2020-03-16 DIAGNOSIS — F331 Major depressive disorder, recurrent, moderate: Secondary | ICD-10-CM

## 2020-03-17 DIAGNOSIS — F3341 Major depressive disorder, recurrent, in partial remission: Secondary | ICD-10-CM | POA: Diagnosis not present

## 2020-03-19 DIAGNOSIS — M25512 Pain in left shoulder: Secondary | ICD-10-CM | POA: Diagnosis not present

## 2020-03-19 DIAGNOSIS — Z96612 Presence of left artificial shoulder joint: Secondary | ICD-10-CM | POA: Diagnosis not present

## 2020-03-24 DIAGNOSIS — M25512 Pain in left shoulder: Secondary | ICD-10-CM | POA: Diagnosis not present

## 2020-03-24 DIAGNOSIS — Z96612 Presence of left artificial shoulder joint: Secondary | ICD-10-CM | POA: Diagnosis not present

## 2020-03-27 DIAGNOSIS — M25512 Pain in left shoulder: Secondary | ICD-10-CM | POA: Diagnosis not present

## 2020-03-27 DIAGNOSIS — Z96612 Presence of left artificial shoulder joint: Secondary | ICD-10-CM | POA: Diagnosis not present

## 2020-03-31 DIAGNOSIS — Z96612 Presence of left artificial shoulder joint: Secondary | ICD-10-CM | POA: Diagnosis not present

## 2020-03-31 DIAGNOSIS — M25512 Pain in left shoulder: Secondary | ICD-10-CM | POA: Diagnosis not present

## 2020-04-01 ENCOUNTER — Ambulatory Visit (INDEPENDENT_AMBULATORY_CARE_PROVIDER_SITE_OTHER): Payer: Medicare Other | Admitting: Psychology

## 2020-04-01 DIAGNOSIS — F331 Major depressive disorder, recurrent, moderate: Secondary | ICD-10-CM

## 2020-04-01 DIAGNOSIS — F411 Generalized anxiety disorder: Secondary | ICD-10-CM

## 2020-04-03 DIAGNOSIS — M25512 Pain in left shoulder: Secondary | ICD-10-CM | POA: Diagnosis not present

## 2020-04-03 DIAGNOSIS — Z96612 Presence of left artificial shoulder joint: Secondary | ICD-10-CM | POA: Diagnosis not present

## 2020-04-06 ENCOUNTER — Ambulatory Visit
Admission: RE | Admit: 2020-04-06 | Discharge: 2020-04-06 | Disposition: A | Discharge status: Home / Self Care | Payer: Medicare Other | Source: Ambulatory Visit | Attending: Family Medicine | Admitting: Family Medicine

## 2020-04-06 ENCOUNTER — Other Ambulatory Visit: Payer: Self-pay

## 2020-04-06 DIAGNOSIS — Z1231 Encounter for screening mammogram for malignant neoplasm of breast: Secondary | ICD-10-CM

## 2020-04-10 DIAGNOSIS — Z96612 Presence of left artificial shoulder joint: Secondary | ICD-10-CM | POA: Diagnosis not present

## 2020-04-10 DIAGNOSIS — M25512 Pain in left shoulder: Secondary | ICD-10-CM | POA: Diagnosis not present

## 2020-04-15 DIAGNOSIS — M542 Cervicalgia: Secondary | ICD-10-CM | POA: Diagnosis not present

## 2020-04-15 DIAGNOSIS — Z96612 Presence of left artificial shoulder joint: Secondary | ICD-10-CM | POA: Diagnosis not present

## 2020-04-15 DIAGNOSIS — M25512 Pain in left shoulder: Secondary | ICD-10-CM | POA: Diagnosis not present

## 2020-04-15 DIAGNOSIS — M47812 Spondylosis without myelopathy or radiculopathy, cervical region: Secondary | ICD-10-CM | POA: Diagnosis not present

## 2020-04-16 ENCOUNTER — Ambulatory Visit (INDEPENDENT_AMBULATORY_CARE_PROVIDER_SITE_OTHER): Payer: Medicare Other | Admitting: Psychology

## 2020-04-16 DIAGNOSIS — F411 Generalized anxiety disorder: Secondary | ICD-10-CM

## 2020-04-16 DIAGNOSIS — F331 Major depressive disorder, recurrent, moderate: Secondary | ICD-10-CM

## 2020-04-20 DIAGNOSIS — M47812 Spondylosis without myelopathy or radiculopathy, cervical region: Secondary | ICD-10-CM | POA: Diagnosis not present

## 2020-04-20 DIAGNOSIS — Z96612 Presence of left artificial shoulder joint: Secondary | ICD-10-CM | POA: Diagnosis not present

## 2020-04-20 DIAGNOSIS — M25512 Pain in left shoulder: Secondary | ICD-10-CM | POA: Diagnosis not present

## 2020-04-20 DIAGNOSIS — M542 Cervicalgia: Secondary | ICD-10-CM | POA: Diagnosis not present

## 2020-04-20 DIAGNOSIS — M4802 Spinal stenosis, cervical region: Secondary | ICD-10-CM | POA: Diagnosis not present

## 2020-04-27 DIAGNOSIS — M47812 Spondylosis without myelopathy or radiculopathy, cervical region: Secondary | ICD-10-CM | POA: Diagnosis not present

## 2020-04-27 DIAGNOSIS — M4802 Spinal stenosis, cervical region: Secondary | ICD-10-CM | POA: Diagnosis not present

## 2020-04-27 DIAGNOSIS — M542 Cervicalgia: Secondary | ICD-10-CM | POA: Diagnosis not present

## 2020-04-27 DIAGNOSIS — Z96612 Presence of left artificial shoulder joint: Secondary | ICD-10-CM | POA: Diagnosis not present

## 2020-04-27 DIAGNOSIS — M25512 Pain in left shoulder: Secondary | ICD-10-CM | POA: Diagnosis not present

## 2020-04-30 ENCOUNTER — Ambulatory Visit (INDEPENDENT_AMBULATORY_CARE_PROVIDER_SITE_OTHER): Payer: Medicare Other | Admitting: Psychology

## 2020-04-30 DIAGNOSIS — M47812 Spondylosis without myelopathy or radiculopathy, cervical region: Secondary | ICD-10-CM | POA: Diagnosis not present

## 2020-04-30 DIAGNOSIS — F411 Generalized anxiety disorder: Secondary | ICD-10-CM | POA: Diagnosis not present

## 2020-04-30 DIAGNOSIS — Z96612 Presence of left artificial shoulder joint: Secondary | ICD-10-CM | POA: Diagnosis not present

## 2020-04-30 DIAGNOSIS — F331 Major depressive disorder, recurrent, moderate: Secondary | ICD-10-CM | POA: Diagnosis not present

## 2020-04-30 DIAGNOSIS — M25512 Pain in left shoulder: Secondary | ICD-10-CM | POA: Diagnosis not present

## 2020-04-30 DIAGNOSIS — M542 Cervicalgia: Secondary | ICD-10-CM | POA: Diagnosis not present

## 2020-04-30 DIAGNOSIS — M4802 Spinal stenosis, cervical region: Secondary | ICD-10-CM | POA: Diagnosis not present

## 2020-05-04 DIAGNOSIS — M542 Cervicalgia: Secondary | ICD-10-CM | POA: Diagnosis not present

## 2020-05-04 DIAGNOSIS — M4802 Spinal stenosis, cervical region: Secondary | ICD-10-CM | POA: Diagnosis not present

## 2020-05-04 DIAGNOSIS — Z96612 Presence of left artificial shoulder joint: Secondary | ICD-10-CM | POA: Diagnosis not present

## 2020-05-04 DIAGNOSIS — M47812 Spondylosis without myelopathy or radiculopathy, cervical region: Secondary | ICD-10-CM | POA: Diagnosis not present

## 2020-05-04 DIAGNOSIS — M25512 Pain in left shoulder: Secondary | ICD-10-CM | POA: Diagnosis not present

## 2020-05-07 DIAGNOSIS — Z96612 Presence of left artificial shoulder joint: Secondary | ICD-10-CM | POA: Diagnosis not present

## 2020-05-07 DIAGNOSIS — M4802 Spinal stenosis, cervical region: Secondary | ICD-10-CM | POA: Diagnosis not present

## 2020-05-07 DIAGNOSIS — M47812 Spondylosis without myelopathy or radiculopathy, cervical region: Secondary | ICD-10-CM | POA: Diagnosis not present

## 2020-05-07 DIAGNOSIS — M542 Cervicalgia: Secondary | ICD-10-CM | POA: Diagnosis not present

## 2020-05-07 DIAGNOSIS — M25512 Pain in left shoulder: Secondary | ICD-10-CM | POA: Diagnosis not present

## 2020-05-11 ENCOUNTER — Ambulatory Visit: Payer: No Typology Code available for payment source | Admitting: Psychology

## 2020-05-11 DIAGNOSIS — M4802 Spinal stenosis, cervical region: Secondary | ICD-10-CM | POA: Diagnosis not present

## 2020-05-11 DIAGNOSIS — Z96612 Presence of left artificial shoulder joint: Secondary | ICD-10-CM | POA: Diagnosis not present

## 2020-05-11 DIAGNOSIS — M25512 Pain in left shoulder: Secondary | ICD-10-CM | POA: Diagnosis not present

## 2020-05-11 DIAGNOSIS — M47812 Spondylosis without myelopathy or radiculopathy, cervical region: Secondary | ICD-10-CM | POA: Diagnosis not present

## 2020-05-11 DIAGNOSIS — M542 Cervicalgia: Secondary | ICD-10-CM | POA: Diagnosis not present

## 2020-05-29 ENCOUNTER — Ambulatory Visit (INDEPENDENT_AMBULATORY_CARE_PROVIDER_SITE_OTHER): Payer: Medicare Other | Admitting: Psychology

## 2020-05-29 DIAGNOSIS — F411 Generalized anxiety disorder: Secondary | ICD-10-CM

## 2020-05-29 DIAGNOSIS — F331 Major depressive disorder, recurrent, moderate: Secondary | ICD-10-CM

## 2020-06-15 DIAGNOSIS — M47812 Spondylosis without myelopathy or radiculopathy, cervical region: Secondary | ICD-10-CM | POA: Diagnosis not present

## 2020-06-15 DIAGNOSIS — F3341 Major depressive disorder, recurrent, in partial remission: Secondary | ICD-10-CM | POA: Diagnosis not present

## 2020-06-26 ENCOUNTER — Ambulatory Visit: Payer: No Typology Code available for payment source | Admitting: Psychology

## 2020-07-08 DIAGNOSIS — E559 Vitamin D deficiency, unspecified: Secondary | ICD-10-CM | POA: Diagnosis not present

## 2020-07-08 DIAGNOSIS — Z79899 Other long term (current) drug therapy: Secondary | ICD-10-CM | POA: Diagnosis not present

## 2020-07-08 DIAGNOSIS — E785 Hyperlipidemia, unspecified: Secondary | ICD-10-CM | POA: Diagnosis not present

## 2020-07-08 DIAGNOSIS — E569 Vitamin deficiency, unspecified: Secondary | ICD-10-CM | POA: Diagnosis not present

## 2020-07-14 DIAGNOSIS — M47812 Spondylosis without myelopathy or radiculopathy, cervical region: Secondary | ICD-10-CM | POA: Diagnosis not present

## 2020-07-14 DIAGNOSIS — Z23 Encounter for immunization: Secondary | ICD-10-CM | POA: Diagnosis not present

## 2020-07-14 DIAGNOSIS — Z01419 Encounter for gynecological examination (general) (routine) without abnormal findings: Secondary | ICD-10-CM | POA: Diagnosis not present

## 2020-07-14 DIAGNOSIS — M5030 Other cervical disc degeneration, unspecified cervical region: Secondary | ICD-10-CM | POA: Diagnosis not present

## 2020-07-14 DIAGNOSIS — F3341 Major depressive disorder, recurrent, in partial remission: Secondary | ICD-10-CM | POA: Diagnosis not present

## 2020-07-14 DIAGNOSIS — Z79899 Other long term (current) drug therapy: Secondary | ICD-10-CM | POA: Diagnosis not present

## 2020-07-14 DIAGNOSIS — M542 Cervicalgia: Secondary | ICD-10-CM | POA: Diagnosis not present

## 2020-07-14 DIAGNOSIS — R292 Abnormal reflex: Secondary | ICD-10-CM | POA: Diagnosis not present

## 2020-07-14 DIAGNOSIS — Z0000 Encounter for general adult medical examination without abnormal findings: Secondary | ICD-10-CM | POA: Diagnosis not present

## 2020-07-14 DIAGNOSIS — G4721 Circadian rhythm sleep disorder, delayed sleep phase type: Secondary | ICD-10-CM | POA: Diagnosis not present

## 2020-07-27 ENCOUNTER — Ambulatory Visit (INDEPENDENT_AMBULATORY_CARE_PROVIDER_SITE_OTHER): Payer: Medicare Other | Admitting: Psychology

## 2020-07-27 DIAGNOSIS — F411 Generalized anxiety disorder: Secondary | ICD-10-CM | POA: Diagnosis not present

## 2020-07-27 DIAGNOSIS — F331 Major depressive disorder, recurrent, moderate: Secondary | ICD-10-CM

## 2020-08-11 DIAGNOSIS — N390 Urinary tract infection, site not specified: Secondary | ICD-10-CM | POA: Diagnosis not present

## 2020-08-11 DIAGNOSIS — N3946 Mixed incontinence: Secondary | ICD-10-CM | POA: Diagnosis not present

## 2020-08-13 ENCOUNTER — Ambulatory Visit (INDEPENDENT_AMBULATORY_CARE_PROVIDER_SITE_OTHER): Payer: Medicare Other | Admitting: Psychology

## 2020-08-13 DIAGNOSIS — F331 Major depressive disorder, recurrent, moderate: Secondary | ICD-10-CM | POA: Diagnosis not present

## 2020-08-13 DIAGNOSIS — F411 Generalized anxiety disorder: Secondary | ICD-10-CM | POA: Diagnosis not present

## 2020-08-14 DIAGNOSIS — M5412 Radiculopathy, cervical region: Secondary | ICD-10-CM | POA: Diagnosis not present

## 2020-08-14 DIAGNOSIS — M542 Cervicalgia: Secondary | ICD-10-CM | POA: Diagnosis not present

## 2020-08-21 DIAGNOSIS — I10 Essential (primary) hypertension: Secondary | ICD-10-CM | POA: Diagnosis not present

## 2020-08-21 DIAGNOSIS — G4733 Obstructive sleep apnea (adult) (pediatric): Secondary | ICD-10-CM | POA: Diagnosis not present

## 2020-08-21 DIAGNOSIS — E1165 Type 2 diabetes mellitus with hyperglycemia: Secondary | ICD-10-CM | POA: Diagnosis not present

## 2020-08-21 DIAGNOSIS — L97922 Non-pressure chronic ulcer of unspecified part of left lower leg with fat layer exposed: Secondary | ICD-10-CM | POA: Diagnosis not present

## 2020-08-21 DIAGNOSIS — E785 Hyperlipidemia, unspecified: Secondary | ICD-10-CM | POA: Diagnosis not present

## 2020-08-21 DIAGNOSIS — M25512 Pain in left shoulder: Secondary | ICD-10-CM | POA: Diagnosis not present

## 2020-08-21 DIAGNOSIS — S90416A Abrasion, unspecified lesser toe(s), initial encounter: Secondary | ICD-10-CM | POA: Diagnosis not present

## 2020-08-21 DIAGNOSIS — Z79899 Other long term (current) drug therapy: Secondary | ICD-10-CM | POA: Diagnosis not present

## 2020-08-21 DIAGNOSIS — E1159 Type 2 diabetes mellitus with other circulatory complications: Secondary | ICD-10-CM | POA: Diagnosis not present

## 2020-08-21 DIAGNOSIS — N3941 Urge incontinence: Secondary | ICD-10-CM | POA: Diagnosis not present

## 2020-08-21 DIAGNOSIS — Z7984 Long term (current) use of oral hypoglycemic drugs: Secondary | ICD-10-CM | POA: Diagnosis not present

## 2020-08-21 DIAGNOSIS — F331 Major depressive disorder, recurrent, moderate: Secondary | ICD-10-CM | POA: Diagnosis not present

## 2020-08-25 ENCOUNTER — Ambulatory Visit (INDEPENDENT_AMBULATORY_CARE_PROVIDER_SITE_OTHER): Payer: Medicare Other | Admitting: Psychology

## 2020-08-25 ENCOUNTER — Other Ambulatory Visit: Payer: Self-pay

## 2020-08-25 ENCOUNTER — Ambulatory Visit (HOSPITAL_COMMUNITY)
Admission: EM | Admit: 2020-08-25 | Discharge: 2020-08-25 | Disposition: A | Discharge status: Home / Self Care | Payer: Medicare Other | Attending: Registered Nurse | Admitting: Registered Nurse

## 2020-08-25 DIAGNOSIS — F331 Major depressive disorder, recurrent, moderate: Secondary | ICD-10-CM

## 2020-08-25 DIAGNOSIS — F411 Generalized anxiety disorder: Secondary | ICD-10-CM | POA: Diagnosis not present

## 2020-08-25 DIAGNOSIS — Z5189 Encounter for other specified aftercare: Secondary | ICD-10-CM | POA: Diagnosis not present

## 2020-08-25 NOTE — ED Notes (Signed)
Pt discharged in no acute distress. Received AVS and reviewed by staff. Verbalized understanding of instructions. Safety maintained.

## 2020-08-25 NOTE — BH Assessment (Signed)
Patient reports to [facility] after seeing therapist [name] today during visit requesting medication change. Patient does not want to be inpatient and is insistent its her medications that has stop working.  Bambi suggested since she can not see her psychiatrist that she come to [facility] to discuss medications .Patient denies HI/ AVH, patient endorse SI/ with no plan or intent off and on for past year. Patient is a retired [employer] scientist and has suffered from MDD for past 40 years. Patient reports her inability to reach her psychiatrist Dr. Darrel Hoover for med adjustment but he is not calling her back.  Patient has no previous self harm or attempts. Patient is medication compliant and was recently prescribed Klonopin .05 mg but cut dose to half and completely stop on 08/17/20 because of the side effects . Patient reports going back to her Xanax since 08/17/20. Patient did stated that she looked into buying a gun awhile ago , but she is frightened of guns so she would never buy one. Patient has a daughter in [place] and a best friend who she plans to go stay with tonight . Patient contracted safety and is urgent

## 2020-08-25 NOTE — BH Assessment (Signed)
Comprehensive Clinical Assessment (CCA) Screening, Triage and Referral Note  08/25/2020 Stacey Santana [id number]  Disposition: Per Earleen Newport, NP patient does not meet inpatient criteria.  IOP with [facility] is recommended. Patient was provided with information for this program, along with referral information for [facility] and [facility] for outpatient psychiatry.   The patient demonstrates the following risk factors for suicide: Chronic risk factors for suicide include: psychiatric disorder of Major Depressive Disorder, recurrrent, severe and anxiety. Acute risk factors for suicide include: social withdrawal/isolation. Protective factors for this patient include: positive social support, positive therapeutic relationship, responsibility to others (children, family), coping skills, hope for the future and life satisfaction. Considering these factors, the overall suicide risk at this point appears to be low. Patient is appropriate for outpatient follow up.  Patient is a 75 year old female with a history of Major Depressive Disorder, recurrent, severe and Anxiety Disorder Unspecified who presents voluntarily to [facility] for assessment.  Patient reports worsening depression and anxiety, hoping to have medications adjusted.  Patient is visibly anxious and states she has had more panic attacks within the past 1-2 months, last episode was the end of March. Patient has spoken to her therapist who recommended that patient contact her provider regarding medication adjustments.  Dr. Casimiro Needle is unable to see patient until 09/05/20. Patient states she was also a bit frustrated when during phone call with Dr. Casimiro Needle on Friday, he didn't recall changing her from klonopin back to xanax recently.  She is hoping to connect with a new provider soon.  Discussed IOP treatment and patient states she is very interested and states Bambi, her therapist, also recommended IOP with [facility].  Patient denies  current SI, stating she had fleeting thoughts about a week ago.  She denies HI, AVH and SA history.  Patient is able to affirm her safety and states she would appreciate referral to IOP with [facility].   Chief Complaint:  Chief Complaint  Patient presents with  . Urgent Emergent Eval   Visit Diagnosis: Major Depressive Disorder, recurrent, severe                             Anxiety Disorder Unspecified  Patient Reported Information How did you hear about [place]? No data recorded  Referral name: No data recorded  Referral phone number: No data recorded Whom do you see for routine medical problems? No data recorded  Practice/Facility Name: No data recorded  Practice/Facility Phone Number: No data recorded  Name of Contact: No data recorded  Contact Number: No data recorded  Contact Fax Number: No data recorded  Prescriber Name: No data recorded  Prescriber Address (if known): No data recorded What Is the Reason for Your Visit/Call Today? medication adjustment / SI no Plan / intent  How Long Has This Been Causing You Problems? 1 wk - 1 month  Have You Recently Been in Any Inpatient Treatment (Hospital/Detox/Crisis Center/28-Day Program)? No   Name/Location of Program/Hospital:No data recorded  How Long Were You There? No data recorded  When Were You Discharged? No data recorded Have You Ever Received Services From [facility] Before? Yes   Who Do You See at [facility]? Outpt BH treatment  Have You Recently Had Any Thoughts About Hurting Yourself? Yes   Are You Planning to Commit Suicide/Harm Yourself At This time?  No  Have you Recently Had Thoughts About [place]? No  Explanation: No data recorded Have You Used Any Alcohol or Drugs in the Past 24 Hours? No   How Long Ago Did You Use Drugs or Alcohol?  No data recorded  What Did You Use and How Much? No data recorded What Do You Feel Would Help You the Most Today? Treatment for Depression or other mood problem  Do  You Currently Have a Therapist/Psychiatrist? Yes   Name of Therapist/Psychiatrist: [place], [facility]   Have You Been Recently Discharged From Any Office Practice or Programs? No   Explanation of Discharge From Practice/Program:  No data recorded    CCA Screening Triage Referral Assessment Type of Contact: Face-to-Face   Is this Initial or Reassessment? No data recorded  Date Telepsych consult ordered in [facility]:  No data recorded  Time Telepsych consult ordered in [facility]:  No data recorded Patient Reported Information Reviewed? Yes   Patient Left Without Being Seen? No data recorded  Reason for Not Completing Assessment: No data recorded Collateral Involvement: No data recorded Does Patient Have a [place]? No data recorded  Name and Contact of Legal Guardian:  No data recorded If Minor and Not Living with Parent(s), Who has Custody? No data recorded Is CPS involved or ever been involved? Never  Is APS involved or ever been involved? Never  Patient Determined To Be At Risk for Harm To Self or Others Based on Review of Patient Reported Information or Presenting Complaint? No   Method: No data recorded  Availability of Means: No data recorded  Intent: No data recorded  Notification Required: No data recorded  Additional Information for Danger to Others Potential:  No data recorded  Additional Comments for Danger to Others Potential:  No data recorded  Are There Guns or Other Weapons in Your Home?  No data recorded   Types of Guns/Weapons: No data recorded   Are These Weapons Safely Secured?                              No data recorded   Who Could Verify You Are Able To Have These Secured:    No data recorded Do You Have any Outstanding Charges, Pending Court Dates, Parole/Probation? No data recorded Contacted To Inform of Risk of Harm To Self or Others: No data recorded Location of Assessment: [facility] [place] Assessment Services  Does Patient Present under  Involuntary Commitment? No   IVC Papers Initial File Date: No data recorded  [place] of Residence: [place]  Patient Currently Receiving the Following Services: Individual Therapy; Medication Management   Determination of Need: Routine (7 days)   Options For Referral: Medication Management; Intensive Outpatient Therapy   Fransico Meadow, [facility]

## 2020-08-25 NOTE — ED Provider Notes (Signed)
[facility] Medical Screening Exam  Patient Name: Stacey Santana MRN: [id number] Date of Evaluation: 08/25/20 Chief Complaint:   Diagnosis:  Final diagnoses:  MDD (major depressive disorder), recurrent episode, moderate ([place])  Encounter for medication adjustment    History of Present illness: Stacey Santana is a 75 y.o. female patient presented to [facility] as a walk in with complaints of anxiety and needing medication adjustments.  Stacey Santana, 75 y.o., female patient seen face to face by this provider, consulted with Dr. Ernie Hew; and chart reviewed on 08/25/20.  On evaluation Stacey Santana states she saw her therapist today Stacey Santana today and requested a medication adjustment/change.  Stacey Santana unable to make changes and recommended patient come to [facility] since she hasn't been able to get with her primary psychiatrist Dr. Casimiro Needle   Patient states she has been on Effexor 150 mg for years and it is no longer working "Dr. Casimiro Needle prescribed me Klonopin, but I had a bad reaction to it.  I tapered off 08/17/20 and started taking my Xanax again.  I just want to have my Effexor switched to something else because it's not working and Dr. Casimiro Needle won't see any one even for an emergency and I can't wait until next scheduled appointment.  Patient states she has called offices several times and left messages and has either missed return call or no call back.  Discussed Intensive outpatient program and patient states she is interested.   During evaluation Stacey Santana is sitting up right in chair in no acute distress.  She is alert, oriented x 4, calm and cooperative.  Her mood is anxious but euthymic with congruent affect.  She does not appear to be responding to internal/external stimuli or delusional thoughts.  Patient denies suicidal/self-harm/homicidal ideation, psychosis, and paranoia.  Patient answered question appropriately.    Psychiatric Specialty  Exam  Presentation  General Appearance:No data recorded Eye Contact:No data recorded Speech:No data recorded Speech Volume:No data recorded Handedness:No data recorded  Mood and Affect  Mood:No data recorded Affect:No data recorded  Thought Process  Thought Processes:No data recorded Descriptions of Associations:No data recorded Orientation:No data recorded Thought Content:No data recorded   Hallucinations:No data recorded Ideas of Reference:No data recorded Suicidal Thoughts:No data recorded Homicidal Thoughts:No data recorded  Sensorium  Memory:No data recorded Judgment:No data recorded Insight:No data recorded  Executive Functions  Concentration:No data recorded Attention Span:No data recorded Recall:No data recorded Fund of Knowledge:No data recorded Language:No data recorded  Psychomotor Activity  Psychomotor Activity:No data recorded  Assets  Assets:No data recorded  Sleep  Sleep:No data recorded Number of hours: No data recorded  No data recorded  Physical Exam: Physical Exam Vitals and nursing note reviewed.  Constitutional:      Appearance: Normal appearance.  HENT:     Head: Normocephalic.  Eyes:     Pupils: Pupils are equal, round, and reactive to light.  Cardiovascular:     Rate and Rhythm: Normal rate.  Pulmonary:     Effort: Pulmonary effort is normal.  Musculoskeletal:        General: Normal range of motion.  Neurological:     General: No focal deficit present.     Mental Status: She is alert and oriented to person, place, and time.  Psychiatric:        Attention and Perception: Attention and perception normal. She does not perceive auditory or visual hallucinations.        Mood and Affect: Affect normal. Mood is  anxious.        Speech: Speech normal.        Behavior: Behavior normal. Behavior is cooperative.        Thought Content: Thought content normal. Thought content is not paranoid or delusional. Thought content does not  include homicidal or suicidal ideation.        Cognition and Memory: Cognition and memory normal.        Judgment: Judgment normal.    Review of Systems  Constitutional: Negative.   HENT: Negative.   Eyes: Negative.   Respiratory: Negative.   Cardiovascular: Negative.   Gastrointestinal: Negative.   Genitourinary: Negative.   Musculoskeletal: Negative.   Skin: Negative.   Neurological: Negative.   Endo/Heme/Allergies: Negative.   Psychiatric/Behavioral: Negative for hallucinations, memory loss, substance abuse and suicidal ideas. Depression: Stable. The patient does not have insomnia. Nervous/anxious: Stable.    Blood pressure (!) 144/104, pulse (!) 102, temperature 97.8 F (36.6 C), temperature source Oral, resp. rate 18, SpO2 99 %. There is no height or weight on file to calculate BMI.  Musculoskeletal: Strength & Muscle Tone: within normal limits Gait & Station: normal Patient leans: N/A   [place] [facility] Discharge Disposition for Follow up and Recommendations: Based on my evaluation the patient does not appear to have an emergency medical condition and can be discharged with resources and follow up care in outpatient services for Medication Management, Partial Hospitalization Program, Individual Therapy and Internsive outpatient program   Stacey Brazzel, NP 08/25/2020, 5:57 PM

## 2020-08-26 ENCOUNTER — Telehealth (HOSPITAL_COMMUNITY): Payer: Self-pay | Admitting: Psychiatry

## 2020-08-26 DIAGNOSIS — H2513 Age-related nuclear cataract, bilateral: Secondary | ICD-10-CM | POA: Diagnosis not present

## 2020-08-26 DIAGNOSIS — E119 Type 2 diabetes mellitus without complications: Secondary | ICD-10-CM | POA: Diagnosis not present

## 2020-08-26 DIAGNOSIS — H353131 Nonexudative age-related macular degeneration, bilateral, early dry stage: Secondary | ICD-10-CM | POA: Diagnosis not present

## 2020-08-26 NOTE — Telephone Encounter (Signed)
D:  Pt referred by Delphia Grates, NP.  A:  Placed call to orient pt to [place].  Pt declining group; states she just wants to see a psychiatrist in order for her medications to be changed.  "I have the coping skills already."  Will relay message to pt's current psychiatrist (Dr. Casimiro Needle) that pt is trying to get in contact with him.  Encouraged pt to call case manager if she changes her mind about MH-IOP.  R:  Pt receptive.

## 2020-08-27 DIAGNOSIS — F331 Major depressive disorder, recurrent, moderate: Secondary | ICD-10-CM | POA: Diagnosis not present

## 2020-08-27 DIAGNOSIS — E1159 Type 2 diabetes mellitus with other circulatory complications: Secondary | ICD-10-CM | POA: Diagnosis not present

## 2020-08-27 DIAGNOSIS — G4721 Circadian rhythm sleep disorder, delayed sleep phase type: Secondary | ICD-10-CM | POA: Diagnosis not present

## 2020-08-27 DIAGNOSIS — N3941 Urge incontinence: Secondary | ICD-10-CM | POA: Diagnosis not present

## 2020-08-27 DIAGNOSIS — R634 Abnormal weight loss: Secondary | ICD-10-CM | POA: Diagnosis not present

## 2020-08-27 DIAGNOSIS — E538 Deficiency of other specified B group vitamins: Secondary | ICD-10-CM | POA: Diagnosis not present

## 2020-08-27 DIAGNOSIS — Z7984 Long term (current) use of oral hypoglycemic drugs: Secondary | ICD-10-CM | POA: Diagnosis not present

## 2020-08-27 DIAGNOSIS — I10 Essential (primary) hypertension: Secondary | ICD-10-CM | POA: Diagnosis not present

## 2020-08-27 DIAGNOSIS — E785 Hyperlipidemia, unspecified: Secondary | ICD-10-CM | POA: Diagnosis not present

## 2020-08-27 DIAGNOSIS — M19019 Primary osteoarthritis, unspecified shoulder: Secondary | ICD-10-CM | POA: Diagnosis not present

## 2020-08-27 DIAGNOSIS — F419 Anxiety disorder, unspecified: Secondary | ICD-10-CM | POA: Diagnosis not present

## 2020-08-27 DIAGNOSIS — G4733 Obstructive sleep apnea (adult) (pediatric): Secondary | ICD-10-CM | POA: Diagnosis not present

## 2020-08-31 ENCOUNTER — Ambulatory Visit (INDEPENDENT_AMBULATORY_CARE_PROVIDER_SITE_OTHER): Payer: Medicare Other | Admitting: Psychology

## 2020-08-31 DIAGNOSIS — F331 Major depressive disorder, recurrent, moderate: Secondary | ICD-10-CM | POA: Diagnosis not present

## 2020-08-31 DIAGNOSIS — F411 Generalized anxiety disorder: Secondary | ICD-10-CM | POA: Diagnosis not present

## 2020-09-02 DIAGNOSIS — F3341 Major depressive disorder, recurrent, in partial remission: Secondary | ICD-10-CM | POA: Diagnosis not present

## 2020-09-03 DIAGNOSIS — R229 Localized swelling, mass and lump, unspecified: Secondary | ICD-10-CM | POA: Diagnosis not present

## 2020-09-03 DIAGNOSIS — M542 Cervicalgia: Secondary | ICD-10-CM | POA: Diagnosis not present

## 2020-09-08 DIAGNOSIS — E785 Hyperlipidemia, unspecified: Secondary | ICD-10-CM | POA: Diagnosis not present

## 2020-09-08 DIAGNOSIS — I10 Essential (primary) hypertension: Secondary | ICD-10-CM | POA: Diagnosis not present

## 2020-09-08 DIAGNOSIS — F3342 Major depressive disorder, recurrent, in full remission: Secondary | ICD-10-CM | POA: Diagnosis not present

## 2020-09-08 DIAGNOSIS — F331 Major depressive disorder, recurrent, moderate: Secondary | ICD-10-CM | POA: Diagnosis not present

## 2020-09-08 DIAGNOSIS — E1159 Type 2 diabetes mellitus with other circulatory complications: Secondary | ICD-10-CM | POA: Diagnosis not present

## 2020-09-08 DIAGNOSIS — M19019 Primary osteoarthritis, unspecified shoulder: Secondary | ICD-10-CM | POA: Diagnosis not present

## 2020-09-08 DIAGNOSIS — K219 Gastro-esophageal reflux disease without esophagitis: Secondary | ICD-10-CM | POA: Diagnosis not present

## 2020-09-08 DIAGNOSIS — G4733 Obstructive sleep apnea (adult) (pediatric): Secondary | ICD-10-CM | POA: Diagnosis not present

## 2020-09-08 DIAGNOSIS — M1990 Unspecified osteoarthritis, unspecified site: Secondary | ICD-10-CM | POA: Diagnosis not present

## 2020-09-08 DIAGNOSIS — F339 Major depressive disorder, recurrent, unspecified: Secondary | ICD-10-CM | POA: Diagnosis not present

## 2020-09-08 DIAGNOSIS — F3341 Major depressive disorder, recurrent, in partial remission: Secondary | ICD-10-CM | POA: Diagnosis not present

## 2020-09-08 DIAGNOSIS — M47812 Spondylosis without myelopathy or radiculopathy, cervical region: Secondary | ICD-10-CM | POA: Diagnosis not present

## 2020-09-09 ENCOUNTER — Ambulatory Visit (INDEPENDENT_AMBULATORY_CARE_PROVIDER_SITE_OTHER): Payer: Medicare Other | Admitting: Psychology

## 2020-09-09 DIAGNOSIS — F411 Generalized anxiety disorder: Secondary | ICD-10-CM

## 2020-09-09 DIAGNOSIS — F331 Major depressive disorder, recurrent, moderate: Secondary | ICD-10-CM

## 2020-09-16 ENCOUNTER — Ambulatory Visit: Payer: Medicare Other | Admitting: Psychology

## 2020-09-19 DIAGNOSIS — Z23 Encounter for immunization: Secondary | ICD-10-CM | POA: Diagnosis not present

## 2020-09-22 ENCOUNTER — Ambulatory Visit (INDEPENDENT_AMBULATORY_CARE_PROVIDER_SITE_OTHER): Payer: Medicare Other | Admitting: Psychology

## 2020-09-22 DIAGNOSIS — F331 Major depressive disorder, recurrent, moderate: Secondary | ICD-10-CM | POA: Diagnosis not present

## 2020-09-22 DIAGNOSIS — F411 Generalized anxiety disorder: Secondary | ICD-10-CM | POA: Diagnosis not present

## 2020-09-24 ENCOUNTER — Encounter: Payer: Self-pay | Admitting: Adult Health

## 2020-09-24 ENCOUNTER — Other Ambulatory Visit: Payer: Self-pay

## 2020-09-24 ENCOUNTER — Ambulatory Visit (INDEPENDENT_AMBULATORY_CARE_PROVIDER_SITE_OTHER): Payer: Medicare Other | Admitting: Adult Health

## 2020-09-24 VITALS — BP 131/88 | HR 69 | Ht 64.0 in | Wt 192.0 lb

## 2020-09-24 DIAGNOSIS — F411 Generalized anxiety disorder: Secondary | ICD-10-CM

## 2020-09-24 NOTE — Progress Notes (Signed)
[facility] MD/PA/NP Initial Note  09/24/2020 4:46 PM Stacey Santana  MRN:  [id number]  Chief Complaint:   HPI:   Describes mood today as "ok". Pleasant. Mood symptoms - denies depression, anxiety, and irritability. Stating "I'm doing alright today". Previously followed by Dr Casimiro Needle (since 2007) for mood symptoms. Has been taking Effexor since 2013 with dose increases over the years. Recent increase from Effexor XR 275m to 3023mdaily with report of worsening mood symptoms - mood decline started in March. Stating "I wasn't doing well". Reports depression, severe anxiety, panic attacks, inability to sleep, and felt out of control. Symptoms possibly related to increased stressors. Was initially unable to get an appointment with Dr. PlCasimiro NeedleEventually advised to seek treatment at ED. [facility] Her primary provider was able to contact Dr. PlCasimiro Needlend he contacted patient for a visit and increased medication. Stating "I feel like my old self again". Stable interest and motivation. Taking medications as prescribed. Working with therapist - [name] - 12/2019. Energy levels stable. Active, does not have a regular exercise routine.   Enjoys some usual interests and activities. Spending time with family. Appetite adequate. Weight stable - 192 pounds. Sleeps well most nights. Averages 6 to 8 hours. Focus and concentration stable. Completing tasks. Managing aspects of household. Retired. Denies SI or HI.  Denies AH or VH.   Previous medication trials: Prozac, Zoloft, Depakote, Lamictal Clonazepam, Xanax, Effexor, Sonata  Visit Diagnosis:    ICD-10-CM   1. Generalized anxiety disorder  F41.1     Past Psychiatric History:   Past Medical History:  Past Medical History:  Diagnosis Date  . Anxiety   . Arthritis    some degenerative signs in other places of her body   . Complication of anesthesia 1993   used scopolamine patch during surgery- manic  episode occurred post op , met with the anesth. dept. afterwards & they thought that the episode was related to scop patch.   . Depression    chronic   . [place]   type II  . GERD (gastroesophageal reflux disease)   . Heart murmur    slight murmur   . High cholesterol   . Hypertension   . Sleep apnea    CPAP     Past Surgical History:  Procedure Laterality Date  . BUNIONECTOMY Bilateral    early 1990's   . FOOT SURGERY     hammertoe - R foot & bunionectomy on both feet   . REDUCTION MAMMAPLASTY Bilateral 1993?  . TOTAL SHOULDER ARTHROPLASTY Left 05/25/2017  . TOTAL SHOULDER ARTHROPLASTY Left 05/25/2017   Procedure: LEFT TOTAL SHOULDER ARTHROPLASTY;  Surgeon: SuJustice BritainMD;  [facility];  Laterality: Left;  . TUBAL LIGATION      Family Psychiatric History: .p      Family History: No family history on file.  Social History:  Social History   Socioeconomic History  . Marital status: Legally Separated    Spouse name: Not on file  . Number of children: Not on file  . Years of education: Not on file  . Highest education level: Not on file  Occupational History  . Not on file  Tobacco Use  . Smoking status: Never Smoker  . Smokeless tobacco: Never Used  Vaping Use  . Vaping Use: Never used  Substance and Sexual Activity  . Alcohol use: No    Comment: wine 2- 3 times  per week, occas. cocktail  . Drug use: No  . Sexual activity: Not on file  Other Topics Concern  . Not on file  Social History Narrative  . Not on file   Social Determinants of Health   Financial Resource Strain: Not on file  Food Insecurity: Not on file  Transportation Needs: Not on file  Physical Activity: Not on file  Stress: Not on file  Social Connections: Not on file    Allergies:  Allergies  Allergen Reactions  . Darvon [Propoxyphene] Nausea And Vomiting  . Hydroxyzine Other (See Comments)    Dizziness   .  Meloxicam Nausea And Vomiting and Other (See Comments)    Pt. Stated, "Meloxicam causes Nausea nad vomiting."  . Scopolamine Other (See Comments)    Psych. Disturbance ?    Metabolic Disorder Labs: Lab Results  Component Value Date   HGBA1C 6.0 (H) 05/18/2017   MPG 125.5 05/18/2017   No results found for: PROLACTIN No results found for: CHOL, TRIG, HDL, CHOLHDL, VLDL, LDLCALC No results found for: TSH  Therapeutic Level Labs: No results found for: LITHIUM No results found for: VALPROATE No components found for:  CBMZ  Current Medications: Current Outpatient Medications  Medication Sig Dispense Refill  . acetaminophen (TYLENOL) 500 MG tablet Take 1,000 mg by mouth every 6 (six) hours as needed for moderate pain or headache.    . b complex vitamins tablet Take 1 tablet by mouth daily.    Marland Kitchen CALCIUM-MAGNESIUM-ZINC PO Take 1 tablet by mouth at bedtime.    . Cholecalciferol (VITAMIN D3) 1000 units CAPS Take 1,000 Units by mouth daily.     . diazepam (VALIUM) 5 MG tablet Take 0.5-1 tablets (2.5-5 mg total) by mouth every 6 (six) hours as needed for muscle spasms or sedation. 40 tablet 1  . HYDROcodone-acetaminophen (NORCO) 10-325 MG tablet Take 1 tablet by mouth every 4 (four) hours as needed. 40 tablet 0  . ibuprofen (ADVIL,MOTRIN) 600 MG tablet Take 600 mg by mouth 2 (two) times daily.    Marland Kitchen losartan (COZAAR) 100 MG tablet Take 100 mg by mouth at bedtime.     . metFORMIN (GLUCOPHAGE) 500 MG tablet Take 500 mg by mouth daily with breakfast.     . metoprolol succinate (TOPROL-XL) 50 MG 24 hr tablet Take 25 mg by mouth daily.     . Multiple Vitamins-Minerals (EYE VITAMINS & MINERALS PO) Take 1 capsule by mouth daily.    Marland Kitchen MYRBETRIQ 50 MG TB24 tablet Take 50 mg by mouth daily.     Marland Kitchen omeprazole (PRILOSEC) 20 MG capsule Take 20 mg by mouth at bedtime.     . ondansetron (ZOFRAN) 4 MG tablet Take 1 tablet (4 mg total) by mouth every 8 (eight) hours as needed for nausea or vomiting. 20 tablet 0   . simvastatin (ZOCOR) 20 MG tablet Take 20 mg by mouth at bedtime.     . traMADol (ULTRAM) 50 MG tablet Take 50 mg by mouth at bedtime as needed for moderate pain.   0  . trimethoprim (TRIMPEX) 100 MG tablet Take 100 mg by mouth at bedtime.     Marland Kitchen venlafaxine XR (EFFEXOR-XR) 150 MG 24 hr capsule Take 150 mg by mouth daily with breakfast.     No current facility-administered medications for this visit.    Medication Side Effects: none  Orders placed this visit:  No orders of the defined types were placed in this encounter.   Psychiatric Specialty Exam:  Review of  Systems  Musculoskeletal: Negative for gait problem.  Neurological: Negative for tremors.  Psychiatric/Behavioral:       Please refer to HPI    Blood pressure 131/88, pulse 69, height 5' 4" (1.626 m), weight 192 lb (87.1 kg).Body mass index is 32.96 kg/m.  General Appearance: Neat and Well Groomed  Eye Contact:  Good  Speech:  Clear and Coherent and Normal Rate  Volume:  Normal  Mood:  Euthymic  Affect:  Appropriate  Thought Process:  Coherent and Descriptions of Associations: Intact  Orientation:  Full (Time, Place, and Person)  Thought Content: Logical   Suicidal Thoughts:  No  Homicidal Thoughts:  No  Memory:  WNL  Judgement:  Good  Insight:  Good  Psychomotor Activity:  Normal  Concentration:  Concentration: Good  Recall:  Good  Fund of Knowledge: Good  Language: Good  Assets:  Communication Skills Desire for Improvement Financial Resources/Insurance Housing Intimacy Leisure Time Physical Health Resilience Social Support Talents/Skills Transportation Vocational/Educational  ADL's:  Intact  Cognition: WNL  Prognosis:  Good   Screenings:  MDQ  Receiving Psychotherapy: No   Treatment Plan/Recommendations:   Plan:  PDMP reviewed  1. Effexor XR 347m daily 2. Xanax 0.266mTID  Seeing therapist - [name]  RTC as needed - 3 months  Patient advised to contact office with any  questions, adverse effects, or acute worsening in signs and symptoms.   Time spent with patient was 60 minutes. Greater than 50% of face to face time with patient was spent on counseling and coordination of care.      ReAloha GellNP

## 2020-09-29 ENCOUNTER — Ambulatory Visit (INDEPENDENT_AMBULATORY_CARE_PROVIDER_SITE_OTHER): Payer: Medicare Other | Admitting: Psychology

## 2020-09-29 DIAGNOSIS — F411 Generalized anxiety disorder: Secondary | ICD-10-CM

## 2020-09-29 DIAGNOSIS — F331 Major depressive disorder, recurrent, moderate: Secondary | ICD-10-CM

## 2020-10-12 DIAGNOSIS — D3705 Neoplasm of uncertain behavior of pharynx: Secondary | ICD-10-CM | POA: Diagnosis not present

## 2020-10-19 DIAGNOSIS — F3342 Major depressive disorder, recurrent, in full remission: Secondary | ICD-10-CM | POA: Diagnosis not present

## 2020-10-19 DIAGNOSIS — F339 Major depressive disorder, recurrent, unspecified: Secondary | ICD-10-CM | POA: Diagnosis not present

## 2020-10-19 DIAGNOSIS — M47812 Spondylosis without myelopathy or radiculopathy, cervical region: Secondary | ICD-10-CM | POA: Diagnosis not present

## 2020-10-19 DIAGNOSIS — F3341 Major depressive disorder, recurrent, in partial remission: Secondary | ICD-10-CM | POA: Diagnosis not present

## 2020-10-19 DIAGNOSIS — F331 Major depressive disorder, recurrent, moderate: Secondary | ICD-10-CM | POA: Diagnosis not present

## 2020-10-19 DIAGNOSIS — M1990 Unspecified osteoarthritis, unspecified site: Secondary | ICD-10-CM | POA: Diagnosis not present

## 2020-10-19 DIAGNOSIS — E1159 Type 2 diabetes mellitus with other circulatory complications: Secondary | ICD-10-CM | POA: Diagnosis not present

## 2020-10-19 DIAGNOSIS — M19019 Primary osteoarthritis, unspecified shoulder: Secondary | ICD-10-CM | POA: Diagnosis not present

## 2020-10-19 DIAGNOSIS — I10 Essential (primary) hypertension: Secondary | ICD-10-CM | POA: Diagnosis not present

## 2020-10-19 DIAGNOSIS — E785 Hyperlipidemia, unspecified: Secondary | ICD-10-CM | POA: Diagnosis not present

## 2020-10-19 DIAGNOSIS — K219 Gastro-esophageal reflux disease without esophagitis: Secondary | ICD-10-CM | POA: Diagnosis not present

## 2020-10-21 ENCOUNTER — Ambulatory Visit (INDEPENDENT_AMBULATORY_CARE_PROVIDER_SITE_OTHER): Payer: Medicare Other | Admitting: Psychology

## 2020-10-21 DIAGNOSIS — F331 Major depressive disorder, recurrent, moderate: Secondary | ICD-10-CM | POA: Diagnosis not present

## 2020-10-21 DIAGNOSIS — F411 Generalized anxiety disorder: Secondary | ICD-10-CM | POA: Diagnosis not present

## 2020-10-28 DIAGNOSIS — Z886 Allergy status to analgesic agent status: Secondary | ICD-10-CM | POA: Diagnosis not present

## 2020-10-28 DIAGNOSIS — Z888 Allergy status to other drugs, medicaments and biological substances status: Secondary | ICD-10-CM | POA: Diagnosis not present

## 2020-10-28 DIAGNOSIS — R221 Localized swelling, mass and lump, neck: Secondary | ICD-10-CM | POA: Diagnosis not present

## 2020-10-28 DIAGNOSIS — C0689 Malignant neoplasm of overlapping sites of other parts of mouth: Secondary | ICD-10-CM | POA: Diagnosis not present

## 2020-10-28 DIAGNOSIS — Z885 Allergy status to narcotic agent status: Secondary | ICD-10-CM | POA: Diagnosis not present

## 2020-11-03 ENCOUNTER — Ambulatory Visit (INDEPENDENT_AMBULATORY_CARE_PROVIDER_SITE_OTHER): Payer: Medicare Other | Admitting: Psychology

## 2020-11-03 DIAGNOSIS — F331 Major depressive disorder, recurrent, moderate: Secondary | ICD-10-CM | POA: Diagnosis not present

## 2020-11-03 DIAGNOSIS — F411 Generalized anxiety disorder: Secondary | ICD-10-CM | POA: Diagnosis not present

## 2020-11-16 ENCOUNTER — Ambulatory Visit (INDEPENDENT_AMBULATORY_CARE_PROVIDER_SITE_OTHER): Payer: Medicare Other | Admitting: Psychology

## 2020-11-16 DIAGNOSIS — F411 Generalized anxiety disorder: Secondary | ICD-10-CM

## 2020-11-16 DIAGNOSIS — F331 Major depressive disorder, recurrent, moderate: Secondary | ICD-10-CM | POA: Diagnosis not present

## 2020-12-01 ENCOUNTER — Ambulatory Visit: Payer: Medicare Other | Admitting: Psychology

## 2020-12-08 ENCOUNTER — Ambulatory Visit (INDEPENDENT_AMBULATORY_CARE_PROVIDER_SITE_OTHER): Payer: Medicare Other | Admitting: Psychology

## 2020-12-08 DIAGNOSIS — F411 Generalized anxiety disorder: Secondary | ICD-10-CM | POA: Diagnosis not present

## 2020-12-08 DIAGNOSIS — F331 Major depressive disorder, recurrent, moderate: Secondary | ICD-10-CM | POA: Diagnosis not present

## 2020-12-24 ENCOUNTER — Ambulatory Visit: Payer: Medicare Other | Admitting: Psychology

## 2020-12-25 ENCOUNTER — Other Ambulatory Visit: Payer: Self-pay

## 2020-12-25 ENCOUNTER — Encounter: Payer: Self-pay | Admitting: Adult Health

## 2020-12-25 ENCOUNTER — Ambulatory Visit (INDEPENDENT_AMBULATORY_CARE_PROVIDER_SITE_OTHER): Payer: Medicare Other | Admitting: Adult Health

## 2020-12-25 DIAGNOSIS — F331 Major depressive disorder, recurrent, moderate: Secondary | ICD-10-CM | POA: Diagnosis not present

## 2020-12-25 DIAGNOSIS — F411 Generalized anxiety disorder: Secondary | ICD-10-CM

## 2020-12-25 DIAGNOSIS — G4700 Insomnia, unspecified: Secondary | ICD-10-CM | POA: Diagnosis not present

## 2020-12-25 MED ORDER — ARIPIPRAZOLE 2 MG PO TABS: 2.0000 mg | ORAL_TABLET | Freq: Every day | ORAL | 2 refills | Status: DC

## 2020-12-25 MED ORDER — ZALEPLON 10 MG PO CAPS: ORAL_CAPSULE | ORAL | 2 refills | Status: DC

## 2020-12-25 NOTE — Progress Notes (Signed)
Stacey Santana [id number] [birth year] 75 y.o.  Subjective:   Patient ID:  Stacey Santana is a 75 y.o. (DOB Jul 20, 1945) female.  Chief Complaint: No chief complaint on file.   HPI Stacey Santana presents to the office today for follow-up of GAD, insomnia and MDD.   Describes mood today as "not too good". Pleasant. Tearful. Mood symptoms - reports increased depression. Feels anxious and irritable. Denies panic attacks. Stating "I'm not doing as well as I was". Unable to identify a precipitant. Stating "sometimes I know, this time I didn't". Not wanting to get out of the bed and notes a decrease in usual behaviors. Has been taking Effexor XR since 2013 with dose changes over the years. Stating "I think it's time to taper off and try something different. Some concerns of wit drawl from Effexor. Stating "it's just not working like it once did". Has tapered off Xanax and done well without it - using mostly for sleep. Is willing to try a low dose of Abilify to manage mood symptoms and then discuss taper off of Effexor. Decreased interest and motivation. Taking medications as prescribed.  Energy levels decreased - "a dramatic change within the last week". Active, does not have a regular exercise routine. Walking some days. Enjoys some usual interests and activities. Lives alone. Legally separated for past 14 years. Spending time with family. Appetite adequate. Weight stable - 192 pounds. Sleeps well most nights. Averages 6 to 8 hours. Focus and concentration stable. Completing tasks. Managing aspects of household. Retired. Denies SI or HI.  Denies AH or VH. Working with therapist - [name] - 12/2019.  Previous medication trials: Prozac, Zoloft, Depakote, Lamictal Clonazepam, Xanax, Effexor, Sonata  Review of Systems:  Review of Systems  Musculoskeletal:  Negative for gait problem.  Neurological:  Negative for tremors.  Psychiatric/Behavioral:         Please refer to HPI   Medications: I  have reviewed the patient's current medications.  Current Outpatient Medications  Medication Sig Dispense Refill   ARIPiprazole (ABILIFY) 2 MG tablet Take 1 tablet (2 mg total) by mouth daily. 30 tablet 2   CALCIUM-MAGNESIUM-ZINC PO Take 1 tablet by mouth at bedtime.     Cholecalciferol (VITAMIN D3) 1000 units CAPS Take 1,000 Units by mouth daily.      diazepam (VALIUM) 5 MG tablet Take 0.5-1 tablets (2.5-5 mg total) by mouth every 6 (six) hours as needed for muscle spasms or sedation. 40 tablet 1   ibuprofen (ADVIL,MOTRIN) 600 MG tablet Take 600 mg by mouth 2 (two) times daily.     losartan (COZAAR) 100 MG tablet Take 100 mg by mouth at bedtime.      metFORMIN (GLUCOPHAGE) 500 MG tablet Take 500 mg by mouth daily with breakfast.      metoprolol succinate (TOPROL-XL) 50 MG 24 hr tablet Take 25 mg by mouth daily.      Multiple Vitamins-Minerals (EYE VITAMINS & MINERALS PO) Take 1 capsule by mouth daily.     MYRBETRIQ 50 MG TB24 tablet Take 50 mg by mouth daily.      omeprazole (PRILOSEC) 20 MG capsule Take 20 mg by mouth at bedtime.      ondansetron (ZOFRAN) 4 MG tablet Take 1 tablet (4 mg total) by mouth every 8 (eight) hours as needed for nausea or vomiting. 20 tablet 0   simvastatin (ZOCOR) 20 MG tablet Take 20 mg by mouth at bedtime.      traMADol (ULTRAM) 50 MG tablet Take 50 mg by mouth  at bedtime as needed for moderate pain.   0   trimethoprim (TRIMPEX) 100 MG tablet Take 100 mg by mouth at bedtime.      venlafaxine XR (EFFEXOR-XR) 150 MG 24 hr capsule Take 150 mg by mouth daily with breakfast.     zaleplon (SONATA) 10 MG capsule 1 capsule at bedtime as needed 30 capsule 2   No current facility-administered medications for this visit.    Medication Side Effects: None  Allergies:  Allergies  Allergen Reactions   Darvon [Propoxyphene] Nausea And Vomiting   Hydroxyzine Other (See Comments)    Dizziness    Meloxicam Nausea And Vomiting and Other (See Comments)    Pt. Stated,  "Meloxicam causes Nausea nad vomiting."   Scopolamine Other (See Comments)    Psych. Disturbance ?    Past Medical History:  Diagnosis Date   Anxiety    Arthritis    some degenerative signs in other places of her body    Complication of anesthesia 1993   used scopolamine patch during surgery- manic episode occurred post op , met with the anesth. dept. afterwards & they thought that the episode was related to scop patch.    Depression    chronic    Diabetes mellitus without complication (HCC)    type II   GERD (gastroesophageal reflux disease)    Heart murmur    slight murmur    High cholesterol    Hypertension    Sleep apnea    CPAP     Past Medical History, Surgical history, Social history, and Family history were reviewed and updated as appropriate.   Please see review of systems for further details on the patient's review from today.   Objective:   Physical Exam:  There were no vitals taken for this visit.  Physical Exam  Lab Review:     Component Value Date/Time   NA 139 05/18/2017 1305   K 4.2 05/18/2017 1305   CL 105 05/18/2017 1305   CO2 23 05/18/2017 1305   GLUCOSE 120 (H) 05/18/2017 1305   BUN 12 05/18/2017 1305   CREATININE 0.66 05/18/2017 1305   CALCIUM 9.4 05/18/2017 1305   GFRNONAA >60 05/18/2017 1305   GFRAA >60 05/18/2017 1305       Component Value Date/Time   WBC 6.5 05/18/2017 1305   RBC 4.34 05/18/2017 1305   HGB 13.2 05/18/2017 1305   HCT 39.7 05/18/2017 1305   PLT 237 05/18/2017 1305   MCV 91.5 05/18/2017 1305   MCH 30.4 05/18/2017 1305   MCHC 33.2 05/18/2017 1305   RDW 13.3 05/18/2017 1305    No results found for: POCLITH, LITHIUM   No results found for: PHENYTOIN, PHENOBARB, VALPROATE, CBMZ   .res Assessment: Plan:     Plan:  PDMP reviewed  1. Effexor XR 376m daily 2. Xanax 0.255mTID - has stopped  3. Add Abilify 59m43maily.   Seeing therapist - [name]  RTC 4 weeks  Patient advised to contact office with  any questions, adverse effects, or acute worsening in signs and symptoms.   Time spent with patient was 60 minutes. Greater than 50% of face to face time with patient was spent on counseling and coordination of care.      Diagnoses and all orders for this visit:  Insomnia, unspecified type -     zaleplon (SONATA) 10 MG capsule; 1 capsule at bedtime as needed  Generalized anxiety disorder -     ARIPiprazole (ABILIFY) 2 MG tablet;  Take 1 tablet (2 mg total) by mouth daily.  Major depressive disorder, recurrent episode, moderate (HCC) -     ARIPiprazole (ABILIFY) 2 MG tablet; Take 1 tablet (2 mg total) by mouth daily.    Please see After Visit Summary for patient specific instructions.  Future Appointments  Date Time Provider [place]  01/05/2021  2:00 PM Cottle, Lucious Groves, LCSW [facility] None  01/22/2021  1:00 PM Sophee Mckimmy, Berdie Ogren, NP [facility] None    No orders of the defined types were placed in this encounter.   -------------------------------

## 2021-01-05 ENCOUNTER — Ambulatory Visit (INDEPENDENT_AMBULATORY_CARE_PROVIDER_SITE_OTHER): Payer: Medicare Other | Admitting: Psychology

## 2021-01-05 DIAGNOSIS — F411 Generalized anxiety disorder: Secondary | ICD-10-CM | POA: Diagnosis not present

## 2021-01-05 DIAGNOSIS — F331 Major depressive disorder, recurrent, moderate: Secondary | ICD-10-CM

## 2021-01-15 DIAGNOSIS — R221 Localized swelling, mass and lump, neck: Secondary | ICD-10-CM | POA: Diagnosis not present

## 2021-01-17 ENCOUNTER — Other Ambulatory Visit: Payer: Self-pay | Admitting: Adult Health

## 2021-01-17 DIAGNOSIS — F331 Major depressive disorder, recurrent, moderate: Secondary | ICD-10-CM

## 2021-01-17 DIAGNOSIS — F411 Generalized anxiety disorder: Secondary | ICD-10-CM

## 2021-01-18 NOTE — Telephone Encounter (Signed)
90 day ok?appt on 9/23

## 2021-01-18 NOTE — Telephone Encounter (Signed)
No - need to see first.

## 2021-01-19 ENCOUNTER — Telehealth: Payer: Self-pay | Admitting: Adult Health

## 2021-01-19 DIAGNOSIS — R221 Localized swelling, mass and lump, neck: Secondary | ICD-10-CM | POA: Diagnosis not present

## 2021-01-19 NOTE — Telephone Encounter (Signed)
Naly called because she wants to come off the Abilify.  She wants to know how to do this.  She took her last dose on Sunday.  Has appt Friday but would like a call today for instructions on how to properly come off the abilify.

## 2021-01-19 NOTE — Telephone Encounter (Signed)
Pt stated she is very groggy and sleeps 12 hours a day with the Abilify.She has tried taking it at different times of the day,but it still gives her these side effects.She last took one Sunday and feels fine.She wants to either come off the med or take 1 tab every other day.

## 2021-01-19 NOTE — Telephone Encounter (Signed)
Have her leave it off for now.

## 2021-01-19 NOTE — Telephone Encounter (Signed)
Pt informed

## 2021-01-22 ENCOUNTER — Other Ambulatory Visit: Payer: Self-pay

## 2021-01-22 ENCOUNTER — Encounter: Payer: Self-pay | Admitting: Adult Health

## 2021-01-22 ENCOUNTER — Ambulatory Visit (INDEPENDENT_AMBULATORY_CARE_PROVIDER_SITE_OTHER): Payer: Medicare Other | Admitting: Adult Health

## 2021-01-22 DIAGNOSIS — F331 Major depressive disorder, recurrent, moderate: Secondary | ICD-10-CM

## 2021-01-22 DIAGNOSIS — F411 Generalized anxiety disorder: Secondary | ICD-10-CM

## 2021-01-22 DIAGNOSIS — G4700 Insomnia, unspecified: Secondary | ICD-10-CM

## 2021-01-22 MED ORDER — BUPROPION HCL ER (XL) 150 MG PO TB24: 150.0000 mg | ORAL_TABLET | Freq: Every day | ORAL | 5 refills | Status: DC

## 2021-01-22 NOTE — Progress Notes (Signed)
Stacey Santana [id number] 04/27/1946 75 y.o.  Subjective:   Patient ID:  Stacey Santana is a 75 y.o. (DOB 1945-07-01) female.  Chief Complaint: No chief complaint on file.   HPI Stacey Santana presents to the office today for follow-up of GAD, insomnia and MDD.   Describes mood today as "not the best". Pleasant. Tearful. Mood symptoms - reports depression - "mind not as negative". Denies anxiety. Feels a "little" irritable. Denies panic attacks. Stating "I'm feeling about the same - maybe a teeny bit better". Recently went to her 55th high school reunion - had to push herself to go, but had a good time. Feels like Abilify was helpful for some things, but felt like it caused some brain fog.  Varying interest and motivation. Taking medications as prescribed.  Energy levels decreased. Active, does not have a regular exercise routine. Walking some days. Enjoys some usual interests and activities. Lives alone. Legally separated for past 14 years. Spending time with family. Appetite adequate. Weight gain - 192 to 198 pounds. Sleeps well most nights. Averages 7 to 9 hours. Focus and concentration stable. Completing tasks. Managing aspects of household. Retired. Denies SI or HI.  Denies AH or VH. Working with therapist - [name] - 12/2019.  Previous medication trials: Prozac, Zoloft, Depakote, Lamictal Clonazepam, Xanax, Effexor, Sonata, Wellbutrin   Review of Systems:  Review of Systems  Musculoskeletal:  Negative for gait problem.  Neurological:  Negative for tremors.  Psychiatric/Behavioral:         Please refer to HPI   Medications: I have reviewed the patient's current medications.  Current Outpatient Medications  Medication Sig Dispense Refill   buPROPion (WELLBUTRIN XL) 150 MG 24 hr tablet Take 1 tablet (150 mg total) by mouth daily. 30 tablet 5   ARIPiprazole (ABILIFY) 2 MG tablet Take 1 tablet (2 mg total) by mouth daily. 30 tablet 2   CALCIUM-MAGNESIUM-ZINC PO Take 1  tablet by mouth at bedtime.     Cholecalciferol (VITAMIN D3) 1000 units CAPS Take 1,000 Units by mouth daily.      diazepam (VALIUM) 5 MG tablet Take 0.5-1 tablets (2.5-5 mg total) by mouth every 6 (six) hours as needed for muscle spasms or sedation. 40 tablet 1   ibuprofen (ADVIL,MOTRIN) 600 MG tablet Take 600 mg by mouth 2 (two) times daily.     losartan (COZAAR) 100 MG tablet Take 100 mg by mouth at bedtime.      metFORMIN (GLUCOPHAGE) 500 MG tablet Take 500 mg by mouth daily with breakfast.      metoprolol succinate (TOPROL-XL) 50 MG 24 hr tablet Take 25 mg by mouth daily.      Multiple Vitamins-Minerals (EYE VITAMINS & MINERALS PO) Take 1 capsule by mouth daily.     MYRBETRIQ 50 MG TB24 tablet Take 50 mg by mouth daily.      omeprazole (PRILOSEC) 20 MG capsule Take 20 mg by mouth at bedtime.      ondansetron (ZOFRAN) 4 MG tablet Take 1 tablet (4 mg total) by mouth every 8 (eight) hours as needed for nausea or vomiting. 20 tablet 0   simvastatin (ZOCOR) 20 MG tablet Take 20 mg by mouth at bedtime.      traMADol (ULTRAM) 50 MG tablet Take 50 mg by mouth at bedtime as needed for moderate pain.   0   trimethoprim (TRIMPEX) 100 MG tablet Take 100 mg by mouth at bedtime.      venlafaxine XR (EFFEXOR-XR) 150 MG 24 hr capsule Take 150  mg by mouth daily with breakfast.     zaleplon (SONATA) 10 MG capsule 1 capsule at bedtime as needed 30 capsule 2   No current facility-administered medications for this visit.    Medication Side Effects: None  Allergies:  Allergies  Allergen Reactions   Darvon [Propoxyphene] Nausea And Vomiting   Hydroxyzine Other (See Comments)    Dizziness    Meloxicam Nausea And Vomiting and Other (See Comments)    Pt. Stated, "Meloxicam causes Nausea nad vomiting."   Scopolamine Other (See Comments)    Psych. Disturbance ?    Past Medical History:  Diagnosis Date   Anxiety    Arthritis    some degenerative signs in other places of her body    Complication of  anesthesia 1993   used scopolamine patch during surgery- manic episode occurred post op , met with the anesth. dept. afterwards & they thought that the episode was related to scop patch.    Depression    chronic    Diabetes mellitus without complication (HCC)    type II   GERD (gastroesophageal reflux disease)    Heart murmur    slight murmur    High cholesterol    Hypertension    Sleep apnea    CPAP     Past Medical History, Surgical history, Social history, and Family history were reviewed and updated as appropriate.   Please see review of systems for further details on the patient's review from today.   Objective:   Physical Exam:  There were no vitals taken for this visit.  Physical Exam Constitutional:      General: She is not in acute distress. Musculoskeletal:        General: No deformity.  Neurological:     Mental Status: She is alert and oriented to person, place, and time.     Coordination: Coordination normal.  Psychiatric:        Attention and Perception: Attention and perception normal. She does not perceive auditory or visual hallucinations.        Mood and Affect: Mood normal. Mood is not anxious or depressed. Affect is not labile, blunt, angry or inappropriate.        Speech: Speech normal.        Behavior: Behavior normal.        Thought Content: Thought content normal. Thought content is not paranoid or delusional. Thought content does not include homicidal or suicidal ideation. Thought content does not include homicidal or suicidal plan.        Cognition and Memory: Cognition and memory normal.        Judgment: Judgment normal.     Comments: Insight intact    Lab Review:     Component Value Date/Time   NA 139 05/18/2017 1305   K 4.2 05/18/2017 1305   CL 105 05/18/2017 1305   CO2 23 05/18/2017 1305   GLUCOSE 120 (H) 05/18/2017 1305   BUN 12 05/18/2017 1305   CREATININE 0.66 05/18/2017 1305   CALCIUM 9.4 05/18/2017 1305   GFRNONAA >60 05/18/2017  1305   GFRAA >60 05/18/2017 1305       Component Value Date/Time   WBC 6.5 05/18/2017 1305   RBC 4.34 05/18/2017 1305   HGB 13.2 05/18/2017 1305   HCT 39.7 05/18/2017 1305   PLT 237 05/18/2017 1305   MCV 91.5 05/18/2017 1305   MCH 30.4 05/18/2017 1305   MCHC 33.2 05/18/2017 1305   RDW 13.3 05/18/2017 1305      No results found for: POCLITH, LITHIUM   No results found for: PHENYTOIN, PHENOBARB, VALPROATE, CBMZ   .res Assessment: Plan:                                                   Plan:  PDMP reviewed  1. Effexor XR 300mg daily 2. Xanax 0.25mg TID - has stopped  3. D/C Abilify 2mg daily. 4. Add Wellbutrin XL 150mg every morning - denies seizure history  Seeing therapist - [name]  RTC 4 weeks  Time spent with patient was 25 minutes. Greater than 50% of face to face time with patient was spent on counseling and coordination of care.   Patient advised to contact office with any questions, adverse effects, or acute worsening in signs and symptoms.   Diagnoses and all orders for this visit:  Generalized anxiety disorder  Major depressive disorder, recurrent episode, moderate (HCC)  Insomnia, unspecified type  Other orders -     buPROPion (WELLBUTRIN XL) 150 MG 24 hr tablet; Take 1 tablet (150 mg total) by mouth daily.    Please see After Visit Summary for patient specific instructions.  Future Appointments  Date Time Provider Department Center  01/28/2021  2:00 PM Cottle, Bambi G, LCSW [facility] None    No orders of the defined types were placed in this encounter.   -------------------------------  

## 2021-01-24 DIAGNOSIS — Z23 Encounter for immunization: Secondary | ICD-10-CM | POA: Diagnosis not present

## 2021-01-25 ENCOUNTER — Other Ambulatory Visit: Payer: Self-pay

## 2021-01-25 MED ORDER — BUPROPION HCL ER (XL) 150 MG PO TB24: 150.0000 mg | ORAL_TABLET | Freq: Every day | ORAL | 5 refills | Status: DC

## 2021-01-26 NOTE — Telephone Encounter (Signed)
This medication was dc'd. See note.

## 2021-01-28 ENCOUNTER — Ambulatory Visit (INDEPENDENT_AMBULATORY_CARE_PROVIDER_SITE_OTHER): Payer: Medicare Other | Admitting: Psychology

## 2021-01-28 DIAGNOSIS — M47812 Spondylosis without myelopathy or radiculopathy, cervical region: Secondary | ICD-10-CM | POA: Diagnosis not present

## 2021-01-28 DIAGNOSIS — F411 Generalized anxiety disorder: Secondary | ICD-10-CM | POA: Diagnosis not present

## 2021-01-28 DIAGNOSIS — F331 Major depressive disorder, recurrent, moderate: Secondary | ICD-10-CM | POA: Diagnosis not present

## 2021-02-01 DIAGNOSIS — L97519 Non-pressure chronic ulcer of other part of right foot with unspecified severity: Secondary | ICD-10-CM | POA: Diagnosis not present

## 2021-02-01 DIAGNOSIS — Z23 Encounter for immunization: Secondary | ICD-10-CM | POA: Diagnosis not present

## 2021-02-08 DIAGNOSIS — E119 Type 2 diabetes mellitus without complications: Secondary | ICD-10-CM | POA: Diagnosis not present

## 2021-02-08 DIAGNOSIS — H353131 Nonexudative age-related macular degeneration, bilateral, early dry stage: Secondary | ICD-10-CM | POA: Diagnosis not present

## 2021-02-08 DIAGNOSIS — H25813 Combined forms of age-related cataract, bilateral: Secondary | ICD-10-CM | POA: Diagnosis not present

## 2021-02-09 DIAGNOSIS — M19071 Primary osteoarthritis, right ankle and foot: Secondary | ICD-10-CM | POA: Diagnosis not present

## 2021-02-09 DIAGNOSIS — M19072 Primary osteoarthritis, left ankle and foot: Secondary | ICD-10-CM | POA: Diagnosis not present

## 2021-02-09 DIAGNOSIS — I739 Peripheral vascular disease, unspecified: Secondary | ICD-10-CM | POA: Diagnosis not present

## 2021-02-09 DIAGNOSIS — E1151 Type 2 diabetes mellitus with diabetic peripheral angiopathy without gangrene: Secondary | ICD-10-CM | POA: Diagnosis not present

## 2021-02-09 DIAGNOSIS — M2041 Other hammer toe(s) (acquired), right foot: Secondary | ICD-10-CM | POA: Diagnosis not present

## 2021-02-09 DIAGNOSIS — M2042 Other hammer toe(s) (acquired), left foot: Secondary | ICD-10-CM | POA: Diagnosis not present

## 2021-02-11 DIAGNOSIS — L97519 Non-pressure chronic ulcer of other part of right foot with unspecified severity: Secondary | ICD-10-CM | POA: Diagnosis not present

## 2021-02-15 ENCOUNTER — Other Ambulatory Visit: Payer: Self-pay

## 2021-02-15 ENCOUNTER — Telehealth: Payer: Self-pay | Admitting: Adult Health

## 2021-02-15 MED ORDER — REXULTI 0.5 MG PO TABS: 0.5000 mg | ORAL_TABLET | Freq: Every day | ORAL | 0 refills | Status: DC

## 2021-02-15 NOTE — Telephone Encounter (Signed)
Please review

## 2021-02-15 NOTE — Telephone Encounter (Signed)
Pt LM on VM reporting she is not having any success with the Wellbutrin. Taken for 3 weeks and she is having a lot of headaches & dizziness. Asking if we can do something else? Contact # [phone]. Apt 11/3

## 2021-02-15 NOTE — Telephone Encounter (Signed)
Pt informed and rx sent 

## 2021-02-15 NOTE — Telephone Encounter (Signed)
Noted  

## 2021-02-15 NOTE — Telephone Encounter (Signed)
Have her d/c Wellbutrin. We can offer Rexulti 0.5mg  daily.

## 2021-02-18 DIAGNOSIS — I739 Peripheral vascular disease, unspecified: Secondary | ICD-10-CM | POA: Diagnosis not present

## 2021-02-19 ENCOUNTER — Telehealth: Payer: Self-pay | Admitting: Adult Health

## 2021-02-19 ENCOUNTER — Other Ambulatory Visit: Payer: Self-pay | Admitting: Adult Health

## 2021-02-19 NOTE — Telephone Encounter (Signed)
Next visit is 03/04/21. Stacey Santana called and said that she doesn't think her medicines are helping her now. She has been scared, not able to get out of bed or stay awake. She is not eating. She is shivering and sweating a lot. What can she do to get in a better state and feel better. Her phone number is [phone].Doing this high priority because she says she is not eating and not able to get out of bed or stay awake.

## 2021-02-19 NOTE — Telephone Encounter (Signed)
Called and spoke with patient. Started on Rexulti 0.5mg  three days ago. Has felt more depressed this week - had not been eating or drinking, but is doing better. Daughter coming over tomorrow to help her get house in order. Plans to restart Xanax as needed for anxiety. Will call on Monday with an update. Advised to go to ED if symptoms worsen. Also advised of after hours call line.

## 2021-02-24 ENCOUNTER — Ambulatory Visit (INDEPENDENT_AMBULATORY_CARE_PROVIDER_SITE_OTHER): Payer: Medicare Other | Admitting: Psychology

## 2021-02-24 DIAGNOSIS — F331 Major depressive disorder, recurrent, moderate: Secondary | ICD-10-CM | POA: Diagnosis not present

## 2021-02-25 DIAGNOSIS — Z7984 Long term (current) use of oral hypoglycemic drugs: Secondary | ICD-10-CM | POA: Diagnosis not present

## 2021-02-25 DIAGNOSIS — E1165 Type 2 diabetes mellitus with hyperglycemia: Secondary | ICD-10-CM | POA: Diagnosis not present

## 2021-02-25 DIAGNOSIS — E1159 Type 2 diabetes mellitus with other circulatory complications: Secondary | ICD-10-CM | POA: Diagnosis not present

## 2021-02-25 DIAGNOSIS — E538 Deficiency of other specified B group vitamins: Secondary | ICD-10-CM | POA: Diagnosis not present

## 2021-03-01 DIAGNOSIS — F331 Major depressive disorder, recurrent, moderate: Secondary | ICD-10-CM | POA: Diagnosis not present

## 2021-03-01 DIAGNOSIS — L97519 Non-pressure chronic ulcer of other part of right foot with unspecified severity: Secondary | ICD-10-CM | POA: Diagnosis not present

## 2021-03-01 DIAGNOSIS — N3941 Urge incontinence: Secondary | ICD-10-CM | POA: Diagnosis not present

## 2021-03-01 DIAGNOSIS — Z7984 Long term (current) use of oral hypoglycemic drugs: Secondary | ICD-10-CM | POA: Diagnosis not present

## 2021-03-01 DIAGNOSIS — F419 Anxiety disorder, unspecified: Secondary | ICD-10-CM | POA: Diagnosis not present

## 2021-03-01 DIAGNOSIS — K219 Gastro-esophageal reflux disease without esophagitis: Secondary | ICD-10-CM | POA: Diagnosis not present

## 2021-03-01 DIAGNOSIS — E1159 Type 2 diabetes mellitus with other circulatory complications: Secondary | ICD-10-CM | POA: Diagnosis not present

## 2021-03-01 DIAGNOSIS — E785 Hyperlipidemia, unspecified: Secondary | ICD-10-CM | POA: Diagnosis not present

## 2021-03-01 DIAGNOSIS — Z23 Encounter for immunization: Secondary | ICD-10-CM | POA: Diagnosis not present

## 2021-03-01 DIAGNOSIS — G4721 Circadian rhythm sleep disorder, delayed sleep phase type: Secondary | ICD-10-CM | POA: Diagnosis not present

## 2021-03-01 DIAGNOSIS — I10 Essential (primary) hypertension: Secondary | ICD-10-CM | POA: Diagnosis not present

## 2021-03-01 DIAGNOSIS — E559 Vitamin D deficiency, unspecified: Secondary | ICD-10-CM | POA: Diagnosis not present

## 2021-03-03 ENCOUNTER — Other Ambulatory Visit: Payer: Self-pay | Admitting: Family Medicine

## 2021-03-03 DIAGNOSIS — Z1231 Encounter for screening mammogram for malignant neoplasm of breast: Secondary | ICD-10-CM

## 2021-03-04 ENCOUNTER — Ambulatory Visit (INDEPENDENT_AMBULATORY_CARE_PROVIDER_SITE_OTHER): Payer: Medicare Other | Admitting: Adult Health

## 2021-03-04 ENCOUNTER — Encounter: Payer: Self-pay | Admitting: Adult Health

## 2021-03-04 ENCOUNTER — Other Ambulatory Visit: Payer: Self-pay

## 2021-03-04 DIAGNOSIS — F411 Generalized anxiety disorder: Secondary | ICD-10-CM | POA: Diagnosis not present

## 2021-03-04 DIAGNOSIS — F331 Major depressive disorder, recurrent, moderate: Secondary | ICD-10-CM

## 2021-03-04 DIAGNOSIS — G4700 Insomnia, unspecified: Secondary | ICD-10-CM | POA: Diagnosis not present

## 2021-03-04 MED ORDER — ZALEPLON 10 MG PO CAPS
ORAL_CAPSULE | ORAL | 2 refills | Status: DC
Start: 2021-03-04 — End: 2021-07-05

## 2021-03-04 MED ORDER — ESCITALOPRAM OXALATE 10 MG PO TABS
10.0000 mg | ORAL_TABLET | Freq: Every day | ORAL | 5 refills | Status: DC
Start: 2021-03-04 — End: 2021-03-31

## 2021-03-04 NOTE — Progress Notes (Signed)
Stacey Santana [id number] 12/20/1945 76 y.o.  Subjective:   Patient ID:  Stacey Santana is a 75 y.o. (DOB Mar 13, 1946) female.  Chief Complaint: No chief complaint on file.   HPI Stacey Santana presents to the office today for follow-up of GAD, insomnia and MDD.   Describes mood today as "not the best". Pleasant. Tearful. Mood symptoms - reports depression. More irritable than usual. Denies anxiety. Denies panic attacks. Stating "I'm not feeling much better". Has been recovering from a reaction from Augmentin - was in the bed for 7 to 8 days. Has been trying to get out some - pushing herself to go. Has been taking Rexulti for about a week and has not noticed a difference. Varying interest and motivation. Taking medications as prescribed.  Energy levels decreased. Active, does not have a regular exercise routine. Enjoys some usual interests and activities. Lives alone. Legally separated for past 14 years. Spending time with family. Appetite adequate. Weight loss - 196 pounds. Sleeps well most nights. Averages 7 to 9 hours. Focus and concentration stable. Completing tasks. Managing aspects of household. Retired. Denies SI or HI.  Denies AH or VH. Working with therapist - [name] - 12/2019.  Previous medication trials: Prozac, Zoloft, Depakote, Lamictal Clonazepam, Xanax, Effexor, Sonata, Wellbutrin   Review of Systems:  Review of Systems  Musculoskeletal:  Negative for gait problem.  Neurological:  Negative for tremors.  Psychiatric/Behavioral:         Please refer to HPI   Medications: I have reviewed the patient's current medications.  Current Outpatient Medications  Medication Sig Dispense Refill   escitalopram (LEXAPRO) 10 MG tablet Take 1 tablet (10 mg total) by mouth daily. 30 tablet 5   CALCIUM-MAGNESIUM-ZINC PO Take 1 tablet by mouth at bedtime.     Cholecalciferol (VITAMIN D3) 1000 units CAPS Take 1,000 Units by mouth daily.      ibuprofen (ADVIL,MOTRIN) 600 MG tablet  Take 600 mg by mouth 2 (two) times daily.     losartan (COZAAR) 100 MG tablet Take 100 mg by mouth at bedtime.      metFORMIN (GLUCOPHAGE) 500 MG tablet Take 500 mg by mouth daily with breakfast.      metoprolol succinate (TOPROL-XL) 50 MG 24 hr tablet Take 25 mg by mouth daily.      Multiple Vitamins-Minerals (EYE VITAMINS & MINERALS PO) Take 1 capsule by mouth daily.     MYRBETRIQ 50 MG TB24 tablet Take 50 mg by mouth daily.      omeprazole (PRILOSEC) 20 MG capsule Take 20 mg by mouth at bedtime.      simvastatin (ZOCOR) 20 MG tablet Take 20 mg by mouth at bedtime.      traMADol (ULTRAM) 50 MG tablet Take 50 mg by mouth at bedtime as needed for moderate pain.   0   trimethoprim (TRIMPEX) 100 MG tablet Take 100 mg by mouth at bedtime.      venlafaxine XR (EFFEXOR-XR) 150 MG 24 hr capsule Take 150 mg by mouth daily with breakfast.     zaleplon (SONATA) 10 MG capsule 1 capsule at bedtime as needed 30 capsule 2   No current facility-administered medications for this visit.    Medication Side Effects: None  Allergies:  Allergies  Allergen Reactions   Darvon [Propoxyphene] Nausea And Vomiting   Hydroxyzine Other (See Comments)    Dizziness    Meloxicam Nausea And Vomiting and Other (See Comments)    Pt. Stated, "Meloxicam causes Nausea nad vomiting."   Scopolamine  Other (See Comments)    Psych. Disturbance ?    Past Medical History:  Diagnosis Date   Anxiety    Arthritis    some degenerative signs in other places of her body    Complication of anesthesia 1993   used scopolamine patch during surgery- manic episode occurred post op , met with the anesth. dept. afterwards & they thought that the episode was related to scop patch.    Depression    chronic    Diabetes mellitus without complication (HCC)    type II   GERD (gastroesophageal reflux disease)    Heart murmur    slight murmur    High cholesterol    Hypertension    Sleep apnea    CPAP     Past Medical History,  Surgical history, Social history, and Family history were reviewed and updated as appropriate.   Please see review of systems for further details on the patient's review from today.   Objective:   Physical Exam:  There were no vitals taken for this visit.  Physical Exam Constitutional:      General: She is not in acute distress. Musculoskeletal:        General: No deformity.  Neurological:     Mental Status: She is alert and oriented to person, place, and time.     Coordination: Coordination normal.  Psychiatric:        Attention and Perception: Attention and perception normal. She does not perceive auditory or visual hallucinations.        Mood and Affect: Mood normal. Mood is not anxious or depressed. Affect is not labile, blunt, angry or inappropriate.        Speech: Speech normal.        Behavior: Behavior normal.        Thought Content: Thought content normal. Thought content is not paranoid or delusional. Thought content does not include homicidal or suicidal ideation. Thought content does not include homicidal or suicidal plan.        Cognition and Memory: Cognition and memory normal.        Judgment: Judgment normal.     Comments: Insight intact    Lab Review:     Component Value Date/Time   NA 139 05/18/2017 1305   K 4.2 05/18/2017 1305   CL 105 05/18/2017 1305   CO2 23 05/18/2017 1305   GLUCOSE 120 (H) 05/18/2017 1305   BUN 12 05/18/2017 1305   CREATININE 0.66 05/18/2017 1305   CALCIUM 9.4 05/18/2017 1305   GFRNONAA >60 05/18/2017 1305   GFRAA >60 05/18/2017 1305       Component Value Date/Time   WBC 6.5 05/18/2017 1305   RBC 4.34 05/18/2017 1305   HGB 13.2 05/18/2017 1305   HCT 39.7 05/18/2017 1305   PLT 237 05/18/2017 1305   MCV 91.5 05/18/2017 1305   MCH 30.4 05/18/2017 1305   MCHC 33.2 05/18/2017 1305   RDW 13.3 05/18/2017 1305    No results found for: POCLITH, LITHIUM   No results found for: PHENYTOIN, PHENOBARB, VALPROATE, CBMZ    .res Assessment: Plan:                                            Plan:  PDMP reviewed  1. Effexor XR 373m daily 2. Xanax 0.252mTID - has stopped  3. D/C Rexulti 0.37m41maily 4.  Add Lexapro 3m daily - 1/2 tab daily x 7 days, then one tablet daily.  Sonata for sleep  Seeing therapist - [name]  RTC 4 weeks  Time spent with patient was 25 minutes. Greater than 50% of face to face time with patient was spent on counseling and coordination of care.   Patient advised to contact office with any questions, adverse effects, or acute worsening in signs and symptoms.    Diagnoses and all orders for this visit:  Generalized anxiety disorder -     escitalopram (LEXAPRO) 10 MG tablet; Take 1 tablet (10 mg total) by mouth daily.  Major depressive disorder, recurrent episode, moderate (HCC) -     escitalopram (LEXAPRO) 10 MG tablet; Take 1 tablet (10 mg total) by mouth daily.  Insomnia, unspecified type -     zaleplon (SONATA) 10 MG capsule; 1 capsule at bedtime as needed    Please see After Visit Summary for patient specific instructions.  Future Appointments  Date Time Provider [place] 04/07/2021  1:40 PM [facility]    No orders of the defined types were placed in this encounter.   -------------------------------

## 2021-03-15 DIAGNOSIS — M47812 Spondylosis without myelopathy or radiculopathy, cervical region: Secondary | ICD-10-CM | POA: Diagnosis not present

## 2021-03-15 DIAGNOSIS — I10 Essential (primary) hypertension: Secondary | ICD-10-CM | POA: Diagnosis not present

## 2021-03-15 DIAGNOSIS — K219 Gastro-esophageal reflux disease without esophagitis: Secondary | ICD-10-CM | POA: Diagnosis not present

## 2021-03-15 DIAGNOSIS — F331 Major depressive disorder, recurrent, moderate: Secondary | ICD-10-CM | POA: Diagnosis not present

## 2021-03-15 DIAGNOSIS — E1159 Type 2 diabetes mellitus with other circulatory complications: Secondary | ICD-10-CM | POA: Diagnosis not present

## 2021-03-15 DIAGNOSIS — E785 Hyperlipidemia, unspecified: Secondary | ICD-10-CM | POA: Diagnosis not present

## 2021-03-15 DIAGNOSIS — M1990 Unspecified osteoarthritis, unspecified site: Secondary | ICD-10-CM | POA: Diagnosis not present

## 2021-03-16 ENCOUNTER — Other Ambulatory Visit: Payer: Self-pay | Admitting: Adult Health

## 2021-03-28 ENCOUNTER — Other Ambulatory Visit: Payer: Self-pay | Admitting: Adult Health

## 2021-03-28 DIAGNOSIS — F331 Major depressive disorder, recurrent, moderate: Secondary | ICD-10-CM

## 2021-03-28 DIAGNOSIS — F411 Generalized anxiety disorder: Secondary | ICD-10-CM

## 2021-03-31 ENCOUNTER — Encounter: Payer: Self-pay | Admitting: Adult Health

## 2021-03-31 ENCOUNTER — Other Ambulatory Visit: Payer: Self-pay

## 2021-03-31 ENCOUNTER — Ambulatory Visit (INDEPENDENT_AMBULATORY_CARE_PROVIDER_SITE_OTHER): Payer: Medicare Other | Admitting: Adult Health

## 2021-03-31 DIAGNOSIS — F331 Major depressive disorder, recurrent, moderate: Secondary | ICD-10-CM

## 2021-03-31 DIAGNOSIS — F411 Generalized anxiety disorder: Secondary | ICD-10-CM | POA: Diagnosis not present

## 2021-03-31 DIAGNOSIS — G4700 Insomnia, unspecified: Secondary | ICD-10-CM | POA: Diagnosis not present

## 2021-03-31 MED ORDER — VILAZODONE HCL 10 MG PO TABS
10.0000 mg | ORAL_TABLET | Freq: Every day | ORAL | 0 refills | Status: DC
Start: 2021-03-31 — End: 2021-07-07

## 2021-03-31 NOTE — Progress Notes (Signed)
Stacey Santana [id number] 04-22-1946 75 y.o.  Subjective:   Patient ID:  Stacey Santana is a 75 y.o. (DOB 1946/01/02) female.  Chief Complaint: No chief complaint on file.   HPI Stacey Santana presents to the office today for follow-up of GAD, insomnia and MDD.   Describes mood today as "not any Santana". Pleasant. Tearful. Mood symptoms - reports ongoing depression. Feels irritable and anxiety at times. Denies panic attacks. Stating "I'm not feeling any Santana". Trying to stay positive and hopeful. Does not report any improvement with the Lexapro. Still having to push herself. Varying interest and motivation. Taking medications as prescribed.  Energy levels decreased. Active, does not have a regular exercise routine. Enjoys some usual interests and activities. Lives alone. Legally separated for past 14 years. Spending time with family. Appetite adequate. Weight loss - 196 pounds. Sleeps well most nights. Averages 7 to 9 hours. Focus and concentration so-so - starting and stopping tasks. Completing tasks. Managing aspects of household. Retired. Denies SI or HI.  Denies AH or VH. Working with therapist - [name] - 12/2019.  Previous medication trials: Prozac, Zoloft, Depakote, Lamictal Clonazepam, Xanax, Effexor, Sonata, Wellbutrin, Rexulti, Abilify   Review of Systems:  Review of Systems  Musculoskeletal:  Negative for gait problem.  Neurological:  Negative for tremors.  Psychiatric/Behavioral:         Please refer to HPI   Medications: I have reviewed the patient's current medications.  Current Outpatient Medications  Medication Sig Dispense Refill   Vilazodone HCl (VIIBRYD) 10 MG TABS Take 1 tablet (10 mg total) by mouth daily. 30 tablet 0   CALCIUM-MAGNESIUM-ZINC PO Take 1 tablet by mouth at bedtime.     Cholecalciferol (VITAMIN D3) 1000 units CAPS Take 1,000 Units by mouth daily.      ibuprofen (ADVIL,MOTRIN) 600 MG tablet Take 600 mg by mouth 2 (two) times daily.      losartan (COZAAR) 100 MG tablet Take 100 mg by mouth at bedtime.      metFORMIN (GLUCOPHAGE) 500 MG tablet Take 500 mg by mouth daily with breakfast.      metoprolol succinate (TOPROL-XL) 50 MG 24 hr tablet Take 25 mg by mouth daily.      Multiple Vitamins-Minerals (EYE VITAMINS & MINERALS PO) Take 1 capsule by mouth daily.     MYRBETRIQ 50 MG TB24 tablet Take 50 mg by mouth daily.      omeprazole (PRILOSEC) 20 MG capsule Take 20 mg by mouth at bedtime.      simvastatin (ZOCOR) 20 MG tablet Take 20 mg by mouth at bedtime.      traMADol (ULTRAM) 50 MG tablet Take 50 mg by mouth at bedtime as needed for moderate pain.   0   trimethoprim (TRIMPEX) 100 MG tablet Take 100 mg by mouth at bedtime.      venlafaxine XR (EFFEXOR-XR) 150 MG 24 hr capsule Take 150 mg by mouth daily with breakfast.     zaleplon (SONATA) 10 MG capsule 1 capsule at bedtime as needed 30 capsule 2   No current facility-administered medications for this visit.    Medication Side Effects: None  Allergies:  Allergies  Allergen Reactions   Oxycodone-Acetaminophen     Other reaction(s): Other (See Comments)   Clonazepam Other (See Comments)   Darvon [Propoxyphene] Nausea And Vomiting   Hydroxyzine Other (See Comments)    Dizziness    Meloxicam Nausea And Vomiting and Other (See Comments)    Pt. Stated, "Meloxicam causes Nausea nad vomiting."  Pollen Extract    Scopolamine Other (See Comments)    Psych. Disturbance ?    Past Medical History:  Diagnosis Date   Anxiety    Arthritis    some degenerative signs in other places of her body    Complication of anesthesia 1993   used scopolamine patch during surgery- manic episode occurred post op , met with the anesth. dept. afterwards & they thought that the episode was related to scop patch.    Depression    chronic    Diabetes mellitus without complication (HCC)    type II   GERD (gastroesophageal reflux disease)    Heart murmur    slight murmur    High  cholesterol    Hypertension    Sleep apnea    CPAP     Past Medical History, Surgical history, Social history, and Family history were reviewed and updated as appropriate.   Please see review of systems for further details on the patient's review from today.   Objective:   Physical Exam:  There were no vitals taken for this visit.  Physical Exam Constitutional:      General: She is not in acute distress. Musculoskeletal:        General: No deformity.  Neurological:     Mental Status: She is alert and oriented to person, place, and time.     Coordination: Coordination normal.  Psychiatric:        Attention and Perception: Attention and perception normal. She does not perceive auditory or visual hallucinations.        Mood and Affect: Mood normal. Mood is not anxious or depressed. Affect is not labile, blunt, angry or inappropriate.        Speech: Speech normal.        Behavior: Behavior normal.        Thought Content: Thought content normal. Thought content is not paranoid or delusional. Thought content does not include homicidal or suicidal ideation. Thought content does not include homicidal or suicidal plan.        Cognition and Memory: Cognition and memory normal.        Judgment: Judgment normal.     Comments: Insight intact    Lab Review:     Component Value Date/Time   NA 139 05/18/2017 1305   K 4.2 05/18/2017 1305   CL 105 05/18/2017 1305   CO2 23 05/18/2017 1305   GLUCOSE 120 (H) 05/18/2017 1305   BUN 12 05/18/2017 1305   CREATININE 0.66 05/18/2017 1305   CALCIUM 9.4 05/18/2017 1305   GFRNONAA >60 05/18/2017 1305   GFRAA >60 05/18/2017 1305       Component Value Date/Time   WBC 6.5 05/18/2017 1305   RBC 4.34 05/18/2017 1305   HGB 13.2 05/18/2017 1305   HCT 39.7 05/18/2017 1305   PLT 237 05/18/2017 1305   MCV 91.5 05/18/2017 1305   MCH 30.4 05/18/2017 1305   MCHC 33.2 05/18/2017 1305   RDW 13.3 05/18/2017 1305    No results found for: POCLITH,  LITHIUM   No results found for: PHENYTOIN, PHENOBARB, VALPROATE, CBMZ   .res Assessment: Plan:                                            Plan:  PDMP reviewed  1. Effexor XR 357m daily - has decresaed to 2228mdaily 2. Xanax  0.79m TID - takes occasionally 3. D/C Lexapro 156mdaily to 2067maily 4. Add Viibryd 32m59mily - samples given  Cataract surgery in January  Sonata for sleep  Seeing therapist - [name]  RTC 4/6 weeks  Time spent with patient was 25 minutes. Greater than 50% of face to face time with patient was spent on counseling and coordination of care.   Patient advised to contact office with any questions, adverse effects, or acute worsening in signs and symptoms.   Diagnoses and all orders for this visit:  Major depressive disorder, recurrent episode, moderate (HCC) -     Vilazodone HCl (VIIBRYD) 10 MG TABS; Take 1 tablet (10 mg total) by mouth daily.  Generalized anxiety disorder  Insomnia, unspecified type    Please see After Visit Summary for patient specific instructions.  Future Appointments  Date Time Provider [place]/10/2020  1:40 PM [facility] 2 [facility]    No orders of the defined types were placed in this encounter.   -------------------------------

## 2021-04-07 ENCOUNTER — Ambulatory Visit
Admission: RE | Admit: 2021-04-07 | Discharge: 2021-04-07 | Disposition: A | Discharge status: Home / Self Care | Payer: Medicare Other | Source: Ambulatory Visit | Attending: Family Medicine | Admitting: Family Medicine

## 2021-04-07 DIAGNOSIS — Z1231 Encounter for screening mammogram for malignant neoplasm of breast: Secondary | ICD-10-CM | POA: Diagnosis not present

## 2021-04-14 ENCOUNTER — Telehealth: Payer: Self-pay | Admitting: Adult Health

## 2021-04-14 ENCOUNTER — Other Ambulatory Visit: Payer: Self-pay

## 2021-04-14 NOTE — Telephone Encounter (Signed)
The patient was the one who called to request a refill.   She said she rarely uses it, but has had some anxiety spells this week and also some depression and she is taking it more often now. She said she has called [name]'s office to try to get in sooner.

## 2021-04-14 NOTE — Telephone Encounter (Signed)
Rx pended for [employer].

## 2021-04-14 NOTE — Telephone Encounter (Signed)
An we call to see if she needs it  - rarely takes it.

## 2021-04-14 NOTE — Telephone Encounter (Signed)
Next visit is 04/28/21. Stacey Santana called stating the Vybrid is not giving any relief from her depression and for the past two nights had nightmares. She said [name] had said something about doing [employer] testing and she would like to do this. Her phone number is [phone].

## 2021-04-14 NOTE — Telephone Encounter (Signed)
Pt called requesting a refill of Alprazolam to [facility].  I don't see it in her med list, but she says Rollene Fare know she last had it prescribed from her previous provider, but the script has expired.  She needs a new script from [place].  Next appt 12/28

## 2021-04-15 MED ORDER — ALPRAZOLAM 0.25 MG PO TABS: 0.2500 mg | ORAL_TABLET | Freq: Three times a day (TID) | ORAL | 0 refills | Status: DC | PRN

## 2021-04-28 ENCOUNTER — Encounter: Payer: Self-pay | Admitting: Adult Health

## 2021-04-28 ENCOUNTER — Other Ambulatory Visit: Payer: Self-pay

## 2021-04-28 ENCOUNTER — Ambulatory Visit (INDEPENDENT_AMBULATORY_CARE_PROVIDER_SITE_OTHER): Payer: Medicare Other | Admitting: Adult Health

## 2021-04-28 DIAGNOSIS — F331 Major depressive disorder, recurrent, moderate: Secondary | ICD-10-CM

## 2021-04-28 DIAGNOSIS — G4700 Insomnia, unspecified: Secondary | ICD-10-CM

## 2021-04-28 DIAGNOSIS — F411 Generalized anxiety disorder: Secondary | ICD-10-CM

## 2021-04-28 MED ORDER — ALPRAZOLAM 0.25 MG PO TABS: 0.2500 mg | ORAL_TABLET | Freq: Three times a day (TID) | ORAL | 2 refills | Status: DC | PRN

## 2021-04-28 MED ORDER — VENLAFAXINE HCL ER 37.5 MG PO CP24: 37.5000 mg | ORAL_CAPSULE | Freq: Every day | ORAL | 1 refills | Status: DC

## 2021-04-28 NOTE — Progress Notes (Signed)
Stacey Santana [id number] 1946-01-18 75 y.o.  Subjective:   Patient ID:  Stacey Santana is a 75 y.o. (DOB 06/02/1945) female.  Chief Complaint: No chief complaint on file.   HPI Stacey Santana presents to the office today for follow-up of GAD, insomnia and MDD.   Describes mood today as "improved". Pleasant. Tearful. Mood symptoms - decreased depression. Less anxious - taking less Xanax. Decreased irritability - "not as much as I was". Denies panic attacks. Stating "I'm doing better". Feels like the addition of Viibryd has been helpful. Varying interest and motivation. Taking medications as prescribed.  Energy levels a lot better. Active, does not have a regular exercise routine. Enjoys some usual interests and activities. Lives alone. Legally separated for past 14 years. Spending time with family. Appetite adequate. Weight stable - 196 pounds. Sleeps well most nights. Averages 7 to 9 hours. Recently got a new CPAP machine. Focus and concentration improved, but still lacking. Completing tasks. Managing aspects of household. Retired. Denies SI or HI.  Denies AH or VH. Working with therapist - Stacey Santana - 12/2019.  Previous medication trials: Prozac, Zoloft, Depakote, Lamictal Clonazepam, Xanax, Effexor, Sonata, Wellbutrin, Rexulti, Abilify  Review of Systems:  Review of Systems  Musculoskeletal:  Negative for gait problem.  Neurological:  Negative for tremors.  Psychiatric/Behavioral:         Please refer to HPI   Medications: I have reviewed the patient's current medications.  Current Outpatient Medications  Medication Sig Dispense Refill   venlafaxine XR (EFFEXOR XR) 37.5 MG 24 hr capsule Take 1 capsule (37.5 mg total) by mouth daily. 90 capsule 1   ALPRAZolam (XANAX) 0.25 MG tablet Take 1 tablet (0.25 mg total) by mouth 3 (three) times daily as needed for anxiety. 90 tablet 2   CALCIUM-MAGNESIUM-ZINC PO Take 1 tablet by mouth at bedtime.     Cholecalciferol (VITAMIN D3)  1000 units CAPS Take 1,000 Units by mouth daily.      ibuprofen (ADVIL,MOTRIN) 600 MG tablet Take 600 mg by mouth 2 (two) times daily.     losartan (COZAAR) 100 MG tablet Take 100 mg by mouth at bedtime.      metFORMIN (GLUCOPHAGE) 500 MG tablet Take 500 mg by mouth daily with breakfast.      metoprolol succinate (TOPROL-XL) 50 MG 24 hr tablet Take 25 mg by mouth daily.      Multiple Vitamins-Minerals (EYE VITAMINS & MINERALS PO) Take 1 capsule by mouth daily.     MYRBETRIQ 50 MG TB24 tablet Take 50 mg by mouth daily.      omeprazole (PRILOSEC) 20 MG capsule Take 20 mg by mouth at bedtime.      simvastatin (ZOCOR) 20 MG tablet Take 20 mg by mouth at bedtime.      traMADol (ULTRAM) 50 MG tablet Take 50 mg by mouth at bedtime as needed for moderate pain.   0   trimethoprim (TRIMPEX) 100 MG tablet Take 100 mg by mouth at bedtime.      venlafaxine XR (EFFEXOR-XR) 150 MG 24 hr capsule Take 150 mg by mouth daily with breakfast.     Vilazodone HCl (VIIBRYD) 10 MG TABS Take 1 tablet (10 mg total) by mouth daily. 30 tablet 0   zaleplon (SONATA) 10 MG capsule 1 capsule at bedtime as needed 30 capsule 2   No current facility-administered medications for this visit.    Medication Side Effects: None  Allergies:  Allergies  Allergen Reactions   Oxycodone-Acetaminophen     Other reaction(s):  Other (See Comments)   Clonazepam Other (See Comments)   Darvon [Propoxyphene] Nausea And Vomiting   Hydroxyzine Other (See Comments)    Dizziness    Meloxicam Nausea And Vomiting and Other (See Comments)    Pt. Stated, "Meloxicam causes Nausea nad vomiting."   Pollen Extract    Scopolamine Other (See Comments)    Psych. Disturbance ?    Past Medical History:  Diagnosis Date   Anxiety    Arthritis    some degenerative signs in other places of her body    Complication of anesthesia 1993   used scopolamine patch during surgery- manic episode occurred post op , met with the anesth. dept. afterwards &  they thought that the episode was related to scop patch.    Depression    chronic    Diabetes mellitus without complication (HCC)    type II   GERD (gastroesophageal reflux disease)    Heart murmur    slight murmur    High cholesterol    Hypertension    Sleep apnea    CPAP     Past Medical History, Surgical history, Social history, and Family history were reviewed and updated as appropriate.   Please see review of systems for further details on the patient's review from today.   Objective:   Physical Exam:  There were no vitals taken for this visit.  Physical Exam Constitutional:      General: She is not in acute distress. Musculoskeletal:        General: No deformity.  Neurological:     Mental Status: She is alert and oriented to person, place, and time.     Coordination: Coordination normal.  Psychiatric:        Attention and Perception: Attention and perception normal. She does not perceive auditory or visual hallucinations.        Mood and Affect: Mood normal. Mood is not anxious or depressed. Affect is not labile, blunt, angry or inappropriate.        Speech: Speech normal.        Behavior: Behavior normal.        Thought Content: Thought content normal. Thought content is not paranoid or delusional. Thought content does not include homicidal or suicidal ideation. Thought content does not include homicidal or suicidal plan.        Cognition and Memory: Cognition and memory normal.        Judgment: Judgment normal.     Comments: Insight intact    Lab Review:     Component Value Date/Time   NA 139 05/18/2017 1305   K 4.2 05/18/2017 1305   CL 105 05/18/2017 1305   CO2 23 05/18/2017 1305   GLUCOSE 120 (H) 05/18/2017 1305   BUN 12 05/18/2017 1305   CREATININE 0.66 05/18/2017 1305   CALCIUM 9.4 05/18/2017 1305   GFRNONAA >60 05/18/2017 1305   GFRAA >60 05/18/2017 1305       Component Value Date/Time   WBC 6.5 05/18/2017 1305   RBC 4.34 05/18/2017 1305   HGB  13.2 05/18/2017 1305   HCT 39.7 05/18/2017 1305   PLT 237 05/18/2017 1305   MCV 91.5 05/18/2017 1305   MCH 30.4 05/18/2017 1305   MCHC 33.2 05/18/2017 1305   RDW 13.3 05/18/2017 1305    No results found for: POCLITH, LITHIUM   No results found for: PHENYTOIN, PHENOBARB, VALPROATE, CBMZ   .res Assessment: Plan:    Plan:  PDMP reviewed  1. Effexor XR 28m  daily  2. Xanax 0.86m TID - takes occasionally 3. D/C Lexapro 142mdaily to 2057maily 4. Viibryd 55m21mily - samples given  Cataract surgery in January  Sonata for sleep  Seeing therapist - Stacey Santana  RTC 10 weeks  Time spent with patient was 15 minutes. Greater than 50% of face to face time with patient was spent on counseling and coordination of care.   Patient advised to contact office with any questions, adverse effects, or acute worsening in signs and symptoms. Diagnoses and all orders for this visit:  Major depressive disorder, recurrent episode, moderate (HCC) -     venlafaxine XR (EFFEXOR XR) 37.5 MG 24 hr capsule; Take 1 capsule (37.5 mg total) by mouth daily.  Generalized anxiety disorder -     ALPRAZolam (XANAX) 0.25 MG tablet; Take 1 tablet (0.25 mg total) by mouth 3 (three) times daily as needed for anxiety. -     venlafaxine XR (EFFEXOR XR) 37.5 MG 24 hr capsule; Take 1 capsule (37.5 mg total) by mouth daily.  Insomnia, unspecified type -     ALPRAZolam (XANAX) 0.25 MG tablet; Take 1 tablet (0.25 mg total) by mouth 3 (three) times daily as needed for anxiety.     Please see After Visit Summary for patient specific instructions.  Future Appointments  Date Time Provider [place]/2023  3:00 PM Santana, Stacey G, LCSW [facility] None    No orders of the defined types were placed in this encounter.   -------------------------------

## 2021-05-04 ENCOUNTER — Ambulatory Visit (INDEPENDENT_AMBULATORY_CARE_PROVIDER_SITE_OTHER): Payer: Medicare Other | Admitting: Psychology

## 2021-05-04 DIAGNOSIS — F331 Major depressive disorder, recurrent, moderate: Secondary | ICD-10-CM | POA: Diagnosis not present

## 2021-05-04 NOTE — Progress Notes (Signed)
[facility] Counselor/Therapist Progress Note  Patient ID: Stacey Santana, MRN: [id number],    Date: 05/04/2021  Time Spent: 50 minutes  Treatment Type: Individual Therapy  Reported Symptoms: depression, decreased motivation  Mental Status Exam: Appearance:  Casual     Behavior: Appropriate  Motor: Normal  Speech/Language:  Normal Rate  Affect: Appropriate  Mood: normal  Thought process: normal  Thought content:   WNL  Sensory/Perceptual disturbances:   WNL  Orientation: oriented to person, place, time/date, and situation  Attention: Good  Concentration: Good  Memory: WNL  Fund of knowledge:  Good  Insight:   Good  Judgment:  Good  Impulse Control: Good   Risk Assessment: Danger to Self:  No Self-injurious Behavior: No Danger to Others: No Duty to Warn:no Physical Aggression / Violence:No  Access to Firearms a concern: No  Gang Involvement:No   Subjective: The patient attended a face-to-face individual therapy session via video visit today.  The patient gave verbal consent for this session to be on  video on WebEx.  The patient was in her home alone and the therapist was in the office.  The patient reports that she feels like she is doing so much better and that she is on hopefully the right medication now.  The patient states that she is on Viibryd and it seems to be working.  She states that it started working on December 18.  She also is on Effexor and is trying to come off of that medication.  The patient states that she is getting back into doing more things for herself at home and she has some things that she needs to get organized before she will feel totally better.  She feels like she can do this now that she is on a better medication.  We talked about her utilizing her coping strategies that she has learned in therapy and we scheduled an appointment in 3-4 weeks for her to check-in.  I reassured her that there is other options for what they can do to  help her with her depression because she was talking about feeling like they might run out of options.  Explained that they have Rolfe and also some other medication combinations that they can utilize.  This seemed to put the patient's mind more at ease.  Interventions: Cognitive Behavioral Therapy, Mindfulness Meditation, and Solution-Oriented/Positive Psychology  Diagnosis:Major depressive disorder, recurrent episode, moderate ([place])  Plan: Please see Treatment plan in Therapy charts with target date of 12/22/2021 for progress and goals.  The patient will attend sessions at least monthly while she is doing well to check in.  She does not have situational stressors that she has to work through, so she needs therapy less often  when she is doing well.  Patient has approved this treatment plan.  Vrinda Heckstall G Mitsue Peery, LCSW

## 2021-05-07 DIAGNOSIS — H25812 Combined forms of age-related cataract, left eye: Secondary | ICD-10-CM | POA: Diagnosis not present

## 2021-05-18 DIAGNOSIS — H2511 Age-related nuclear cataract, right eye: Secondary | ICD-10-CM | POA: Diagnosis not present

## 2021-05-21 DIAGNOSIS — H25811 Combined forms of age-related cataract, right eye: Secondary | ICD-10-CM | POA: Diagnosis not present

## 2021-05-24 DIAGNOSIS — G4733 Obstructive sleep apnea (adult) (pediatric): Secondary | ICD-10-CM | POA: Diagnosis not present

## 2021-06-01 ENCOUNTER — Ambulatory Visit (INDEPENDENT_AMBULATORY_CARE_PROVIDER_SITE_OTHER): Payer: Medicare Other | Admitting: Psychology

## 2021-06-01 DIAGNOSIS — F331 Major depressive disorder, recurrent, moderate: Secondary | ICD-10-CM | POA: Diagnosis not present

## 2021-06-01 DIAGNOSIS — F411 Generalized anxiety disorder: Secondary | ICD-10-CM | POA: Diagnosis not present

## 2021-06-01 NOTE — Progress Notes (Signed)
[facility] Counselor/Therapist Progress Note  Patient ID: Stacey Santana, MRN: [id number],    Date: 06/01/2021  Time Spent: 50 minutes  Treatment Type: Individual Therapy  Reported Symptoms: depression, decreased motivation  Mental Status Exam: Appearance:  Casual     Behavior: Appropriate  Motor: Normal  Speech/Language:  Normal Rate  Affect: Appropriate  Mood: normal  Thought process: normal  Thought content:   WNL  Sensory/Perceptual disturbances:   WNL  Orientation: oriented to person, place, time/date, and situation  Attention: Good  Concentration: Good  Memory: WNL  Fund of knowledge:  Good  Insight:   Good  Judgment:  Good  Impulse Control: Good   Risk Assessment: Danger to Self:  No Self-injurious Behavior: No Danger to Others: No Duty to Warn:no Physical Aggression / Violence:No  Access to Firearms a concern: No  Gang Involvement:No   Subjective: The patient attended a face-to-face individual therapy session via video visit today.  The patient gave verbal consent for this session to be on  video on WebEx.  The patient was in her home alone and the therapist was in the office.  The patient presents as pleasant and cooperative.  She states that her medications still continue to work.  She is still decreasing the Effexor right now.  The patient says that the new medication is working well.  The patient seems to be doing things socially and handling them well.  She also is doing better at not over committing herself.  We talked about checking in every 4-6 weeks.  She is aware that she can call the office in between session if needed.  Interventions: Cognitive Behavioral Therapy, Mindfulness Meditation, and Solution-Oriented/Positive Psychology  Diagnosis:Major depressive disorder, recurrent episode, moderate (HCC)  Generalized anxiety disorder  Plan: Please see Treatment plan in Therapy charts with target date of 12/22/2021 for progress and goals.   The patient will attend sessions at least monthly while she is doing well to check in.  She does not have situational stressors that she has to work through, so she needs therapy less often  when she is doing well.  Patient has approved this treatment plan.  [name] G Manali Mcelmurry, LCSW                  [name] G Algenis Ballin, LCSW

## 2021-06-29 ENCOUNTER — Ambulatory Visit: Payer: Medicare Other | Admitting: Psychology

## 2021-06-29 DIAGNOSIS — E1151 Type 2 diabetes mellitus with diabetic peripheral angiopathy without gangrene: Secondary | ICD-10-CM | POA: Diagnosis not present

## 2021-06-29 DIAGNOSIS — I739 Peripheral vascular disease, unspecified: Secondary | ICD-10-CM | POA: Diagnosis not present

## 2021-06-29 DIAGNOSIS — M2042 Other hammer toe(s) (acquired), left foot: Secondary | ICD-10-CM | POA: Diagnosis not present

## 2021-06-29 DIAGNOSIS — M19071 Primary osteoarthritis, right ankle and foot: Secondary | ICD-10-CM | POA: Diagnosis not present

## 2021-06-29 DIAGNOSIS — M2041 Other hammer toe(s) (acquired), right foot: Secondary | ICD-10-CM | POA: Diagnosis not present

## 2021-06-29 DIAGNOSIS — M19072 Primary osteoarthritis, left ankle and foot: Secondary | ICD-10-CM | POA: Diagnosis not present

## 2021-07-02 DIAGNOSIS — E1159 Type 2 diabetes mellitus with other circulatory complications: Secondary | ICD-10-CM | POA: Diagnosis not present

## 2021-07-02 DIAGNOSIS — I10 Essential (primary) hypertension: Secondary | ICD-10-CM | POA: Diagnosis not present

## 2021-07-02 DIAGNOSIS — F331 Major depressive disorder, recurrent, moderate: Secondary | ICD-10-CM | POA: Diagnosis not present

## 2021-07-02 DIAGNOSIS — K219 Gastro-esophageal reflux disease without esophagitis: Secondary | ICD-10-CM | POA: Diagnosis not present

## 2021-07-05 ENCOUNTER — Other Ambulatory Visit: Payer: Self-pay | Admitting: Adult Health

## 2021-07-05 DIAGNOSIS — S336XXA Sprain of sacroiliac joint, initial encounter: Secondary | ICD-10-CM | POA: Diagnosis not present

## 2021-07-05 DIAGNOSIS — F3342 Major depressive disorder, recurrent, in full remission: Secondary | ICD-10-CM | POA: Diagnosis not present

## 2021-07-05 DIAGNOSIS — G4700 Insomnia, unspecified: Secondary | ICD-10-CM

## 2021-07-05 DIAGNOSIS — R350 Frequency of micturition: Secondary | ICD-10-CM | POA: Diagnosis not present

## 2021-07-05 NOTE — Telephone Encounter (Signed)
Last filled 1/24 appt on 3/8

## 2021-07-07 ENCOUNTER — Encounter: Payer: Self-pay | Admitting: Adult Health

## 2021-07-07 ENCOUNTER — Other Ambulatory Visit: Payer: Self-pay

## 2021-07-07 ENCOUNTER — Ambulatory Visit (INDEPENDENT_AMBULATORY_CARE_PROVIDER_SITE_OTHER): Payer: Medicare Other | Admitting: Adult Health

## 2021-07-07 DIAGNOSIS — M9903 Segmental and somatic dysfunction of lumbar region: Secondary | ICD-10-CM | POA: Diagnosis not present

## 2021-07-07 DIAGNOSIS — M9905 Segmental and somatic dysfunction of pelvic region: Secondary | ICD-10-CM | POA: Diagnosis not present

## 2021-07-07 DIAGNOSIS — G4700 Insomnia, unspecified: Secondary | ICD-10-CM | POA: Diagnosis not present

## 2021-07-07 DIAGNOSIS — M25552 Pain in left hip: Secondary | ICD-10-CM | POA: Diagnosis not present

## 2021-07-07 DIAGNOSIS — M5137 Other intervertebral disc degeneration, lumbosacral region: Secondary | ICD-10-CM | POA: Diagnosis not present

## 2021-07-07 DIAGNOSIS — F411 Generalized anxiety disorder: Secondary | ICD-10-CM | POA: Diagnosis not present

## 2021-07-07 DIAGNOSIS — F331 Major depressive disorder, recurrent, moderate: Secondary | ICD-10-CM

## 2021-07-07 MED ORDER — VILAZODONE HCL 10 MG PO TABS: 10.0000 mg | ORAL_TABLET | Freq: Every day | ORAL | 3 refills | Status: DC

## 2021-07-07 NOTE — Progress Notes (Signed)
Ghadeer Kastelic ?[id number] ?[birth year] ?76 y.o. ? ?Subjective:  ? ?Patient ID:  Stacey Santana is a 76 y.o. (DOB 1945-11-11) female. ? ?Chief Complaint: No chief complaint on file. ? ? ?HPI ?Stacey Santana presents to the office today for follow-up of GAD, insomnia and MDD.  ? ?Describes mood today as "ok". Pleasant. Tearful. Mood symptoms - denies depression - "little waves". Anxiety - "very rare". Decreased irritability - "not as much as I was". Denies panic attacks. Stating "I'm doing pretty good". Feels like medications working well. Stable interest and motivation. Taking medications as prescribed.  ?Energy levels stable. Active, does not have a regular exercise routine. ?Enjoys some usual interests and activities. Lives alone. Legally separated for past 14 years. Spending time with family. ?Appetite adequate. Weight gain - 4 pounds 196 to 200 pounds. ?Sleeps well most nights. Averages 7 to 9 hours. Recently got a new CPAP machine. ?Focus and concentration improved. Completing tasks. Managing aspects of household. Retired. ?Denies SI or HI.  ?Denies AH or VH. ?Working with therapist - [name] - 12/2019. ? ?Previous medication trials: Prozac, Zoloft, Depakote, Lamictal Clonazepam, Xanax, Effexor, Sonata, Wellbutrin, Rexulti, Abilify ? ?Review of Systems:  ?Review of Systems  ?Musculoskeletal:  Negative for gait problem.  ?Neurological:  Negative for tremors.  ?Psychiatric/Behavioral:    ?     Please refer to HPI  ? ?Medications: I have reviewed the patient's current medications. ? ?Current Outpatient Medications  ?Medication Sig Dispense Refill  ? ALPRAZolam (XANAX) 0.25 MG tablet Take 1 tablet (0.25 mg total) by mouth 3 (three) times daily as needed for anxiety. 90 tablet 2  ? CALCIUM-MAGNESIUM-ZINC PO Take 1 tablet by mouth at bedtime.    ? Cholecalciferol (VITAMIN D3) 1000 units CAPS Take 1,000 Units by mouth daily.     ? ibuprofen (ADVIL,MOTRIN) 600 MG tablet Take 600 mg by mouth 2 (two) times daily.    ?  losartan (COZAAR) 100 MG tablet Take 100 mg by mouth at bedtime.     ? metFORMIN (GLUCOPHAGE) 500 MG tablet Take 500 mg by mouth daily with breakfast.     ? metoprolol succinate (TOPROL-XL) 50 MG 24 hr tablet Take 25 mg by mouth daily.     ? Multiple Vitamins-Minerals (EYE VITAMINS & MINERALS PO) Take 1 capsule by mouth daily.    ? MYRBETRIQ 50 MG TB24 tablet Take 50 mg by mouth daily.     ? omeprazole (PRILOSEC) 20 MG capsule Take 20 mg by mouth at bedtime.     ? simvastatin (ZOCOR) 20 MG tablet Take 20 mg by mouth at bedtime.     ? traMADol (ULTRAM) 50 MG tablet Take 50 mg by mouth at bedtime as needed for moderate pain.   0  ? trimethoprim (TRIMPEX) 100 MG tablet Take 100 mg by mouth at bedtime.     ? venlafaxine XR (EFFEXOR XR) 37.5 MG 24 hr capsule Take 1 capsule (37.5 mg total) by mouth daily. 90 capsule 1  ? venlafaxine XR (EFFEXOR-XR) 150 MG 24 hr capsule Take 150 mg by mouth daily with breakfast.    ? Vilazodone HCl (VIIBRYD) 10 MG TABS Take 1 tablet (10 mg total) by mouth daily. 90 tablet 3  ? zaleplon (SONATA) 10 MG capsule TAKE 1 CAPSULE AT BEDTIME AS NEEDED 30 capsule 0  ? ?No current facility-administered medications for this visit.  ? ? ?Medication Side Effects: None ? ?Allergies:  ?Allergies  ?Allergen Reactions  ? Hydroxyzine Other (See Comments)  ?  Dizziness ? ?  Other reaction(s): Dizziness  ? Meloxicam Nausea And Vomiting and Other (See Comments)  ?  Pt. Stated, "Meloxicam causes Nausea nad vomiting." ?Other reaction(s): Other (See Comments), Vomiting ?Pt. Stated, "Meloxicam causes Nausea nad vomiting." ?  ? Oxycodone-Acetaminophen   ?  Other reaction(s): Other (See Comments)  ? Amoxicillin-Pot Clavulanate   ?  Other reaction(s): diarrhea  ? Aripiprazole   ?  Other reaction(s): ineffective, dizziness  ? Brexpiprazole   ?  Other reaction(s): ineffective  ? Bupropion Hcl Er (Xl)   ?  Other reaction(s): ineffective  ? Clonazepam Other (See Comments)  ? Darvon [Propoxyphene] Nausea And Vomiting  ?  Escitalopram Oxalate   ?  Other reaction(s): ineffective  ? Fluoxetine   ?  Other reaction(s): ineffective after 10 yrs  ? Pollen Extract   ? Scopolamine Other (See Comments)  ?  Psych. Disturbance ?  ? Sertraline Hcl   ?  Other reaction(s): ineffective after 10 yrs  ? ? ?Past Medical History:  ?Diagnosis Date  ? Anxiety   ? Arthritis   ? some degenerative signs in other places of her body   ? Complication of anesthesia 1993  ? used scopolamine patch during surgery- manic episode occurred post op , met with the anesth. dept. afterwards & they thought that the episode was related to scop patch.   ? Depression   ? chronic   ? Diabetes mellitus without complication ([place])   ? type II  ? GERD (gastroesophageal reflux disease)   ? Heart murmur   ? slight murmur   ? High cholesterol   ? Hypertension   ? Sleep apnea   ? CPAP   ? ? ?Past Medical History, Surgical history, Social history, and Family history were reviewed and updated as appropriate.  ? ?Please see review of systems for further details on the patient's review from today.  ? ?Objective:  ? ?Physical Exam:  ?There were no vitals taken for this visit. ? ?Physical Exam ?Constitutional:   ?   General: She is not in acute distress. ?Musculoskeletal:     ?   General: No deformity.  ?Neurological:  ?   Mental Status: She is alert and oriented to person, place, and time.  ?   Coordination: Coordination normal.  ?Psychiatric:     ?   Attention and Perception: Attention and perception normal. She does not perceive auditory or visual hallucinations.     ?   Mood and Affect: Mood normal. Mood is not anxious or depressed. Affect is not labile, blunt, angry or inappropriate.     ?   Speech: Speech normal.     ?   Behavior: Behavior normal.     ?   Thought Content: Thought content normal. Thought content is not paranoid or delusional. Thought content does not include homicidal or suicidal ideation. Thought content does not include homicidal or suicidal plan.     ?   Cognition  and Memory: Cognition and memory normal.     ?   Judgment: Judgment normal.  ?   Comments: Insight intact  ? ? ?Lab Review:  ?   ?Component Value Date/Time  ? NA 139 05/18/2017 1305  ? K 4.2 05/18/2017 1305  ? CL 105 05/18/2017 1305  ? CO2 23 05/18/2017 1305  ? GLUCOSE 120 (H) 05/18/2017 1305  ? BUN 12 05/18/2017 1305  ? CREATININE 0.66 05/18/2017 1305  ? CALCIUM 9.4 05/18/2017 1305  ? GFRNONAA >60 05/18/2017 1305  ? GFRAA >60 05/18/2017 1305  ? ? ?   ?  Component Value Date/Time  ? WBC 6.5 05/18/2017 1305  ? RBC 4.34 05/18/2017 1305  ? HGB 13.2 05/18/2017 1305  ? HCT 39.7 05/18/2017 1305  ? PLT 237 05/18/2017 1305  ? MCV 91.5 05/18/2017 1305  ? MCH 30.4 05/18/2017 1305  ? MCHC 33.2 05/18/2017 1305  ? RDW 13.3 05/18/2017 1305  ? ? ?No results found for: POCLITH, LITHIUM  ? ?No results found for: PHENYTOIN, PHENOBARB, VALPROATE, CBMZ  ? ?.res ?Assessment: Plan:   ? ?Plan: ? ?PDMP reviewed ? ?1. Effexor XR 60m daily  - tapering off medication. ?2. Xanax 0.248mTID - takes occasionally ?3. Viibryd 2055maily  ? ?Seeing therapist - [name] ? ?RTC 6 months ? ?Time spent with patient was 15 minutes. Greater than 50% of face to face time with patient was spent on counseling and coordination of care.  ? ?Patient advised to contact office with any questions, adverse effects, or acute worsening in signs and symptoms. ? ?Diagnoses and all orders for this visit: ? ?Insomnia, unspecified type ? ?Generalized anxiety disorder ? ?Major depressive disorder, recurrent episode, moderate ([place]-     Vilazodone HCl (VIIBRYD) 10 MG TABS; Take 1 tablet (10 mg total) by mouth daily. ? ?  ? ?Please see After Visit Summary for patient specific instructions. ? ?Future Appointments  ?Date Time Provider [place]/21/2023  4:00 PM Cottle, Bambi G, LCSW [facility] None  ?01/05/2022  1:20 PM Greenleigh Kauth, RegBerdie OgrenP [facility] None  ? ? ?No orders of the defined types were placed in this  encounter. ? ? ?------------------------------- ?

## 2021-07-12 ENCOUNTER — Other Ambulatory Visit: Payer: Self-pay

## 2021-07-12 DIAGNOSIS — M9903 Segmental and somatic dysfunction of lumbar region: Secondary | ICD-10-CM | POA: Diagnosis not present

## 2021-07-12 DIAGNOSIS — M9905 Segmental and somatic dysfunction of pelvic region: Secondary | ICD-10-CM | POA: Diagnosis not present

## 2021-07-12 DIAGNOSIS — M5137 Other intervertebral disc degeneration, lumbosacral region: Secondary | ICD-10-CM | POA: Diagnosis not present

## 2021-07-12 DIAGNOSIS — M25552 Pain in left hip: Secondary | ICD-10-CM | POA: Diagnosis not present

## 2021-07-12 DIAGNOSIS — F331 Major depressive disorder, recurrent, moderate: Secondary | ICD-10-CM

## 2021-07-12 MED ORDER — VILAZODONE HCL 20 MG PO TABS: 20.0000 mg | ORAL_TABLET | Freq: Every day | ORAL | 0 refills | Status: DC

## 2021-07-15 DIAGNOSIS — M9903 Segmental and somatic dysfunction of lumbar region: Secondary | ICD-10-CM | POA: Diagnosis not present

## 2021-07-15 DIAGNOSIS — M9905 Segmental and somatic dysfunction of pelvic region: Secondary | ICD-10-CM | POA: Diagnosis not present

## 2021-07-15 DIAGNOSIS — M25552 Pain in left hip: Secondary | ICD-10-CM | POA: Diagnosis not present

## 2021-07-15 DIAGNOSIS — M5137 Other intervertebral disc degeneration, lumbosacral region: Secondary | ICD-10-CM | POA: Diagnosis not present

## 2021-07-19 DIAGNOSIS — Z0000 Encounter for general adult medical examination without abnormal findings: Secondary | ICD-10-CM | POA: Diagnosis not present

## 2021-07-19 DIAGNOSIS — M85851 Other specified disorders of bone density and structure, right thigh: Secondary | ICD-10-CM | POA: Diagnosis not present

## 2021-07-19 DIAGNOSIS — D485 Neoplasm of uncertain behavior of skin: Secondary | ICD-10-CM | POA: Diagnosis not present

## 2021-07-19 DIAGNOSIS — M533 Sacrococcygeal disorders, not elsewhere classified: Secondary | ICD-10-CM | POA: Diagnosis not present

## 2021-07-19 DIAGNOSIS — Z23 Encounter for immunization: Secondary | ICD-10-CM | POA: Diagnosis not present

## 2021-07-19 DIAGNOSIS — K219 Gastro-esophageal reflux disease without esophagitis: Secondary | ICD-10-CM | POA: Diagnosis not present

## 2021-07-19 DIAGNOSIS — M542 Cervicalgia: Secondary | ICD-10-CM | POA: Diagnosis not present

## 2021-07-20 ENCOUNTER — Ambulatory Visit: Payer: Medicare Other | Admitting: Psychology

## 2021-07-20 ENCOUNTER — Other Ambulatory Visit: Payer: Self-pay | Admitting: Family Medicine

## 2021-07-20 DIAGNOSIS — M85851 Other specified disorders of bone density and structure, right thigh: Secondary | ICD-10-CM

## 2021-07-20 DIAGNOSIS — M9903 Segmental and somatic dysfunction of lumbar region: Secondary | ICD-10-CM | POA: Diagnosis not present

## 2021-07-20 DIAGNOSIS — M25552 Pain in left hip: Secondary | ICD-10-CM | POA: Diagnosis not present

## 2021-07-20 DIAGNOSIS — M5137 Other intervertebral disc degeneration, lumbosacral region: Secondary | ICD-10-CM | POA: Diagnosis not present

## 2021-07-20 DIAGNOSIS — M9905 Segmental and somatic dysfunction of pelvic region: Secondary | ICD-10-CM | POA: Diagnosis not present

## 2021-07-27 ENCOUNTER — Other Ambulatory Visit: Payer: Self-pay | Admitting: Family Medicine

## 2021-07-27 DIAGNOSIS — Z1231 Encounter for screening mammogram for malignant neoplasm of breast: Secondary | ICD-10-CM

## 2021-08-04 DIAGNOSIS — M542 Cervicalgia: Secondary | ICD-10-CM | POA: Diagnosis not present

## 2021-08-04 DIAGNOSIS — M533 Sacrococcygeal disorders, not elsewhere classified: Secondary | ICD-10-CM | POA: Diagnosis not present

## 2021-08-09 DIAGNOSIS — M533 Sacrococcygeal disorders, not elsewhere classified: Secondary | ICD-10-CM | POA: Diagnosis not present

## 2021-08-09 DIAGNOSIS — M542 Cervicalgia: Secondary | ICD-10-CM | POA: Diagnosis not present

## 2021-08-09 DIAGNOSIS — Z20822 Contact with and (suspected) exposure to covid-19: Secondary | ICD-10-CM | POA: Diagnosis not present

## 2021-08-12 DIAGNOSIS — M533 Sacrococcygeal disorders, not elsewhere classified: Secondary | ICD-10-CM | POA: Diagnosis not present

## 2021-08-12 DIAGNOSIS — M542 Cervicalgia: Secondary | ICD-10-CM | POA: Diagnosis not present

## 2021-08-13 DIAGNOSIS — N362 Urethral caruncle: Secondary | ICD-10-CM | POA: Diagnosis not present

## 2021-08-13 DIAGNOSIS — N952 Postmenopausal atrophic vaginitis: Secondary | ICD-10-CM | POA: Diagnosis not present

## 2021-08-13 DIAGNOSIS — N3946 Mixed incontinence: Secondary | ICD-10-CM | POA: Diagnosis not present

## 2021-08-13 DIAGNOSIS — N390 Urinary tract infection, site not specified: Secondary | ICD-10-CM | POA: Diagnosis not present

## 2021-08-16 ENCOUNTER — Other Ambulatory Visit: Payer: Self-pay | Admitting: Adult Health

## 2021-08-16 ENCOUNTER — Telehealth: Payer: Self-pay | Admitting: Adult Health

## 2021-08-16 DIAGNOSIS — F411 Generalized anxiety disorder: Secondary | ICD-10-CM

## 2021-08-16 DIAGNOSIS — G4700 Insomnia, unspecified: Secondary | ICD-10-CM

## 2021-08-16 DIAGNOSIS — M542 Cervicalgia: Secondary | ICD-10-CM | POA: Diagnosis not present

## 2021-08-16 DIAGNOSIS — M533 Sacrococcygeal disorders, not elsewhere classified: Secondary | ICD-10-CM | POA: Diagnosis not present

## 2021-08-16 MED ORDER — ALPRAZOLAM 0.25 MG PO TABS: 0.2500 mg | ORAL_TABLET | Freq: Three times a day (TID) | ORAL | 2 refills | Status: DC | PRN

## 2021-08-16 NOTE — Telephone Encounter (Signed)
Pt LVM @ 12:36p.  She would like refill of Alprazolam to [facility]. ? ?Next appt 9/6 ?

## 2021-08-16 NOTE — Telephone Encounter (Signed)
Script sent  

## 2021-08-19 DIAGNOSIS — M533 Sacrococcygeal disorders, not elsewhere classified: Secondary | ICD-10-CM | POA: Diagnosis not present

## 2021-08-19 DIAGNOSIS — M542 Cervicalgia: Secondary | ICD-10-CM | POA: Diagnosis not present

## 2021-08-23 DIAGNOSIS — M542 Cervicalgia: Secondary | ICD-10-CM | POA: Diagnosis not present

## 2021-08-23 DIAGNOSIS — M533 Sacrococcygeal disorders, not elsewhere classified: Secondary | ICD-10-CM | POA: Diagnosis not present

## 2021-08-24 ENCOUNTER — Other Ambulatory Visit: Payer: Self-pay | Admitting: Adult Health

## 2021-08-24 DIAGNOSIS — E1159 Type 2 diabetes mellitus with other circulatory complications: Secondary | ICD-10-CM | POA: Diagnosis not present

## 2021-08-27 DIAGNOSIS — M533 Sacrococcygeal disorders, not elsewhere classified: Secondary | ICD-10-CM | POA: Diagnosis not present

## 2021-08-27 DIAGNOSIS — M542 Cervicalgia: Secondary | ICD-10-CM | POA: Diagnosis not present

## 2021-08-30 DIAGNOSIS — Z20822 Contact with and (suspected) exposure to covid-19: Secondary | ICD-10-CM | POA: Diagnosis not present

## 2021-09-01 DIAGNOSIS — E1159 Type 2 diabetes mellitus with other circulatory complications: Secondary | ICD-10-CM | POA: Diagnosis not present

## 2021-09-01 DIAGNOSIS — R350 Frequency of micturition: Secondary | ICD-10-CM | POA: Diagnosis not present

## 2021-09-01 DIAGNOSIS — N3941 Urge incontinence: Secondary | ICD-10-CM | POA: Diagnosis not present

## 2021-09-01 DIAGNOSIS — G4733 Obstructive sleep apnea (adult) (pediatric): Secondary | ICD-10-CM | POA: Diagnosis not present

## 2021-09-01 DIAGNOSIS — M25552 Pain in left hip: Secondary | ICD-10-CM | POA: Diagnosis not present

## 2021-09-01 DIAGNOSIS — E559 Vitamin D deficiency, unspecified: Secondary | ICD-10-CM | POA: Diagnosis not present

## 2021-09-01 DIAGNOSIS — K219 Gastro-esophageal reflux disease without esophagitis: Secondary | ICD-10-CM | POA: Diagnosis not present

## 2021-09-01 DIAGNOSIS — I10 Essential (primary) hypertension: Secondary | ICD-10-CM | POA: Diagnosis not present

## 2021-09-01 DIAGNOSIS — M5451 Vertebrogenic low back pain: Secondary | ICD-10-CM | POA: Diagnosis not present

## 2021-09-01 DIAGNOSIS — F3342 Major depressive disorder, recurrent, in full remission: Secondary | ICD-10-CM | POA: Diagnosis not present

## 2021-09-01 DIAGNOSIS — M19019 Primary osteoarthritis, unspecified shoulder: Secondary | ICD-10-CM | POA: Diagnosis not present

## 2021-09-01 DIAGNOSIS — E785 Hyperlipidemia, unspecified: Secondary | ICD-10-CM | POA: Diagnosis not present

## 2021-09-01 DIAGNOSIS — Z79899 Other long term (current) drug therapy: Secondary | ICD-10-CM | POA: Diagnosis not present

## 2021-09-02 ENCOUNTER — Other Ambulatory Visit: Payer: Self-pay | Admitting: Family Medicine

## 2021-09-02 ENCOUNTER — Ambulatory Visit
Admission: RE | Admit: 2021-09-02 | Discharge: 2021-09-02 | Disposition: A | Discharge status: Home / Self Care | Payer: Medicare Other | Source: Ambulatory Visit | Attending: Family Medicine | Admitting: Family Medicine

## 2021-09-02 DIAGNOSIS — M545 Low back pain, unspecified: Secondary | ICD-10-CM | POA: Diagnosis not present

## 2021-09-02 DIAGNOSIS — M25552 Pain in left hip: Secondary | ICD-10-CM | POA: Diagnosis not present

## 2021-10-06 DIAGNOSIS — M87852 Other osteonecrosis, left femur: Secondary | ICD-10-CM | POA: Diagnosis not present

## 2021-10-06 DIAGNOSIS — M5136 Other intervertebral disc degeneration, lumbar region: Secondary | ICD-10-CM | POA: Diagnosis not present

## 2021-10-06 DIAGNOSIS — S39012A Strain of muscle, fascia and tendon of lower back, initial encounter: Secondary | ICD-10-CM | POA: Diagnosis not present

## 2021-10-09 IMAGING — MG DIGITAL SCREENING BILAT W/ TOMO W/ CAD
8 series · 8 of 24 positions shown · non-contrast
Comparison: Previous exam(s).

CLINICAL DATA: Screening.

EXAM:
DIGITAL SCREENING BILATERAL MAMMOGRAM WITH TOMO AND CAD

[L MLO synth-2D]
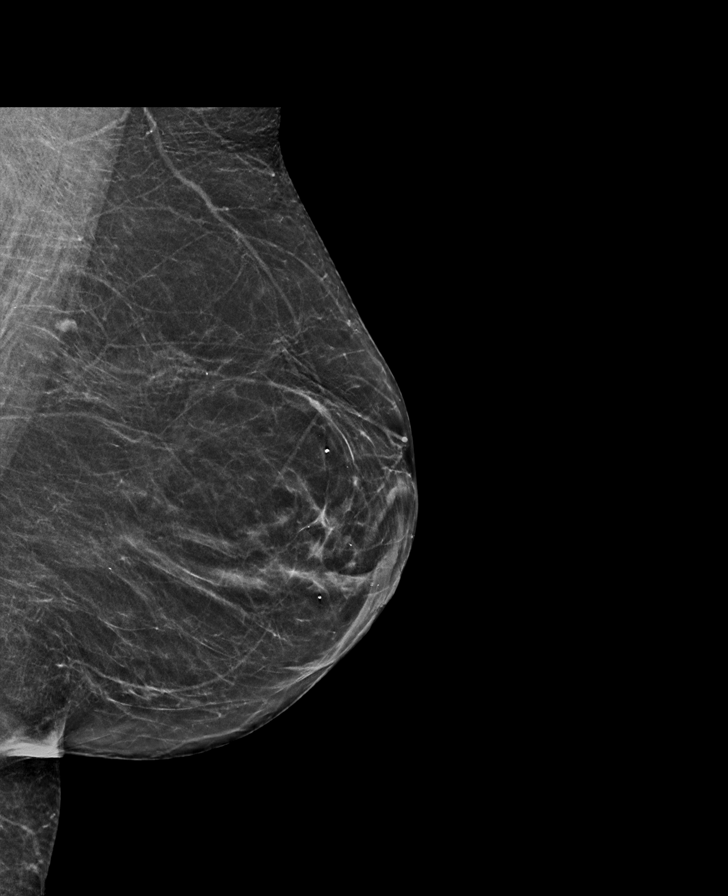

[L CC synth-2D]
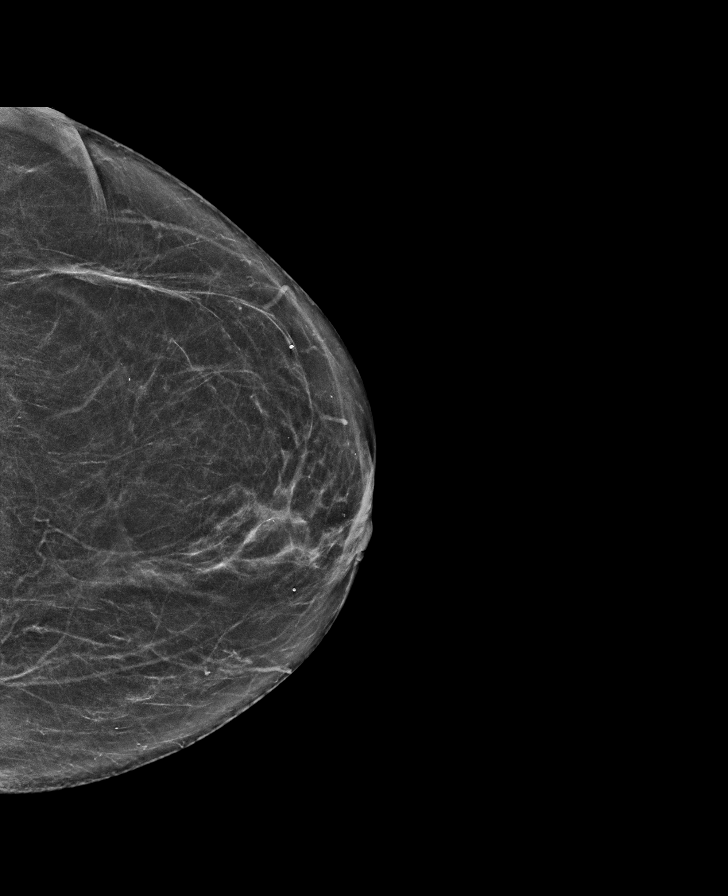

[R MLO synth-2D]
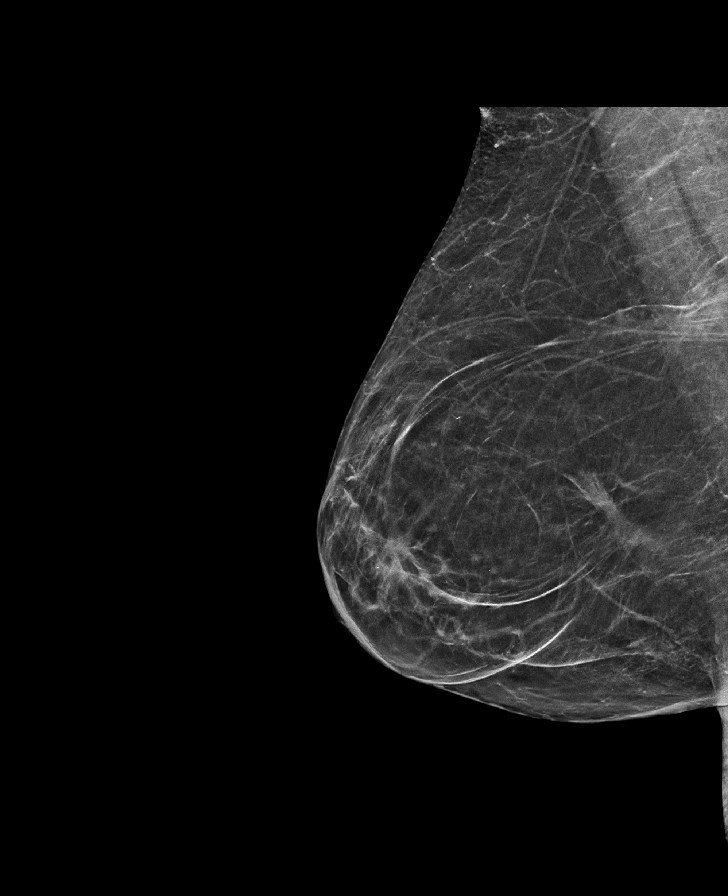

[R CC synth-2D]
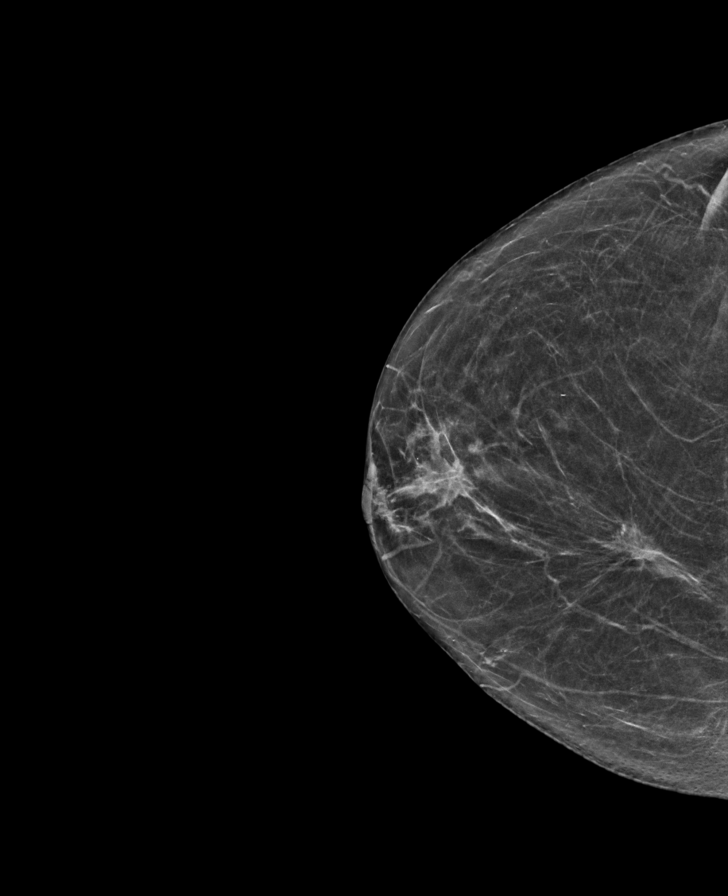

[L MLO tomo · tomo slice 30/59.0]
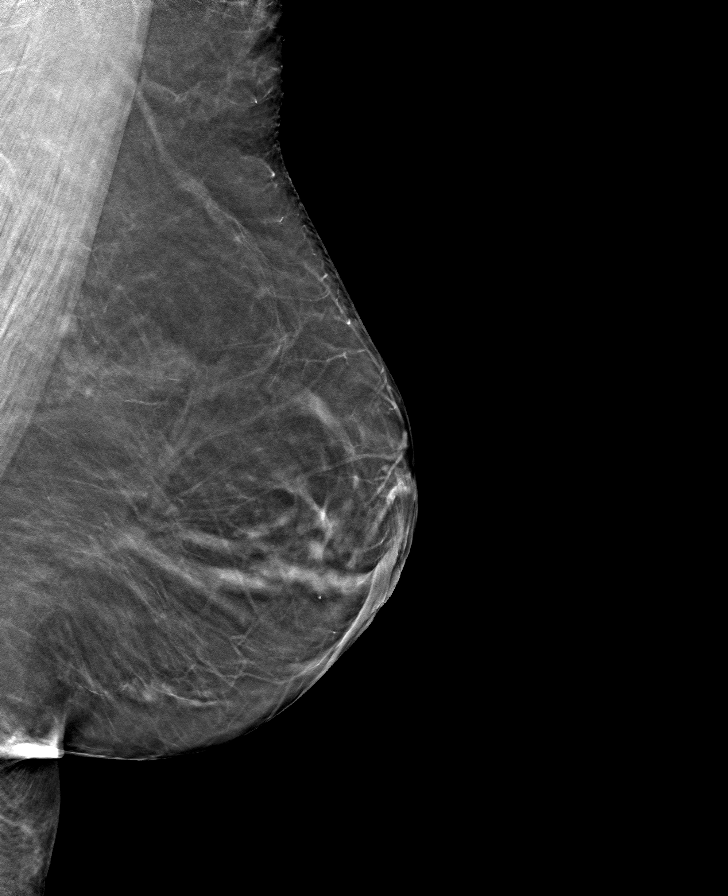

[R CC tomo · tomo slice 29/57.0]
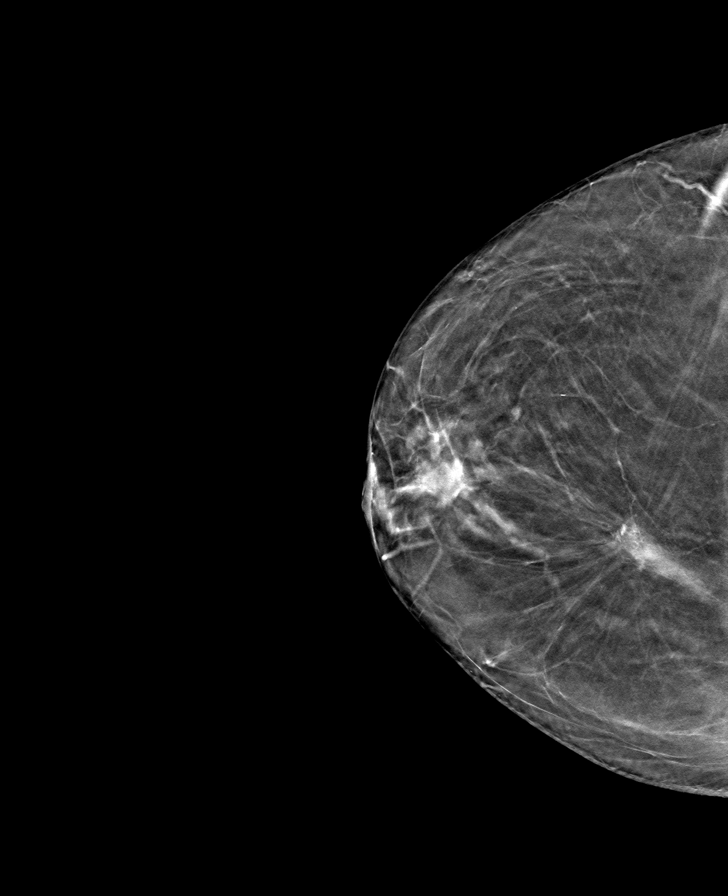

[R MLO tomo · tomo slice 31/62.0]
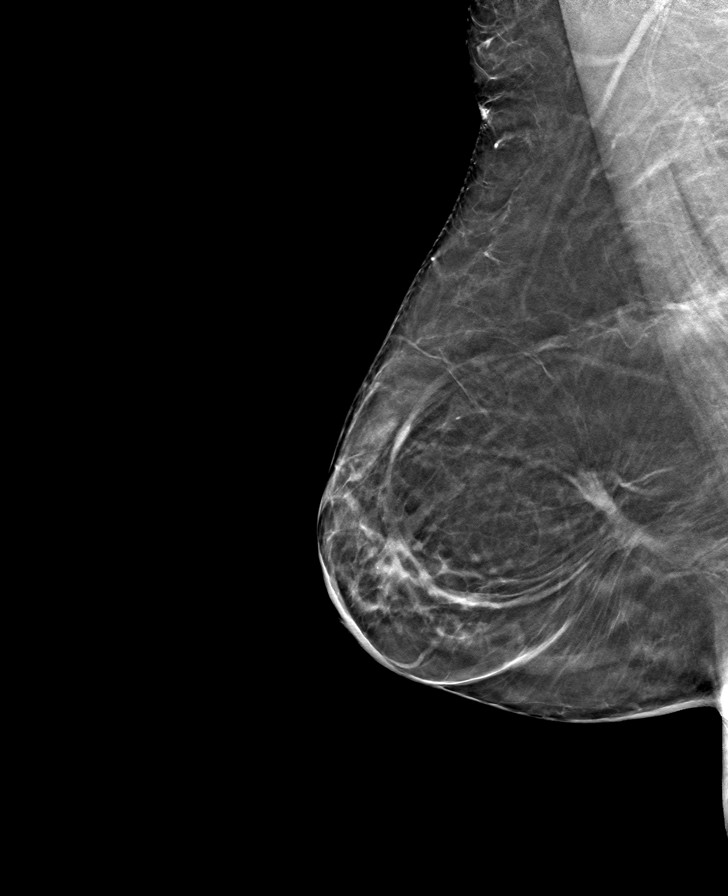

[L CC tomo · tomo slice 29/58.0]
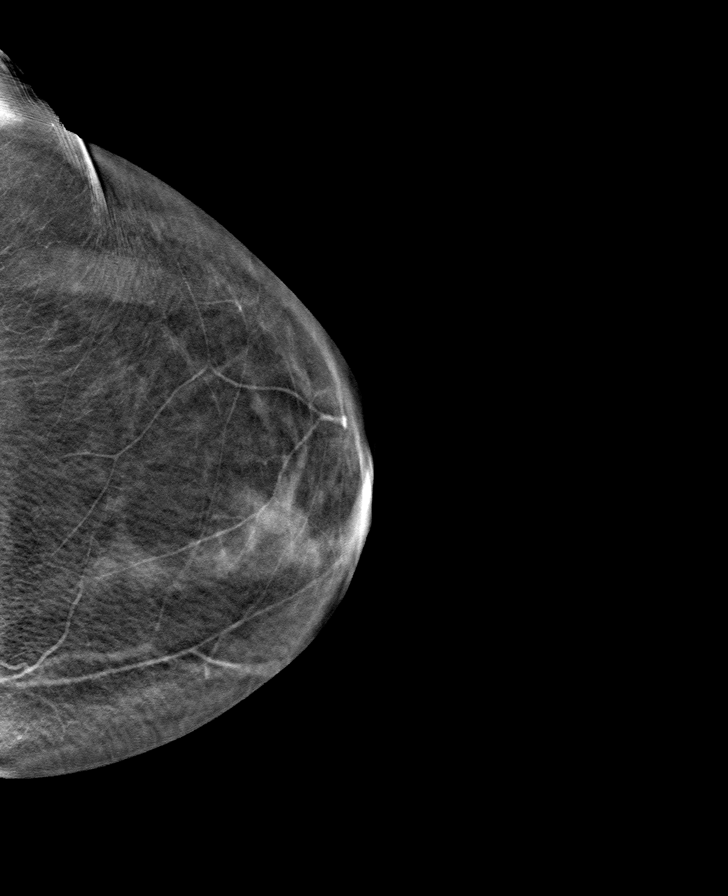

[8 of 24 positions shown; findings below may reference images not displayed]

ACR Breast Density Category b: There are scattered areas of
fibroglandular density.
FINDINGS: There are no findings suspicious for malignancy. Images were
processed with CAD.
IMPRESSION: No mammographic evidence of malignancy. A result letter of this
screening mammogram will be mailed directly to the patient.

RECOMMENDATION:
Screening mammogram in one year. (Code:[id number])

BI-RADS CATEGORY  1: Negative.

## 2021-10-11 ENCOUNTER — Telehealth: Payer: Self-pay | Admitting: Adult Health

## 2021-10-11 NOTE — Telephone Encounter (Signed)
Pt LVM @ 10:11a.  She said she is not getting any relief from the Viibryd '20mg'$  and wants to know if she can increase the dose.  Pls send script to  [facility], [address] [place], [place] [address]  Phone:  [phone]  Fax:  [phone]   Next appt 9/6

## 2021-10-11 NOTE — Telephone Encounter (Signed)
Ok to increase to '20mg'$  daily.

## 2021-10-11 NOTE — Telephone Encounter (Signed)
LVM to rtc 

## 2021-10-11 NOTE — Telephone Encounter (Signed)
Pt stated viibryd is not keeping depression under control anymore.She said it worked well for months and she thinks it's time to increase the dose

## 2021-10-11 NOTE — Telephone Encounter (Signed)
We can try '30mg'$  daily.

## 2021-10-11 NOTE — Telephone Encounter (Signed)
Pt stated she is already taking 20 mg

## 2021-10-12 ENCOUNTER — Other Ambulatory Visit: Payer: Self-pay

## 2021-10-12 DIAGNOSIS — D2272 Melanocytic nevi of left lower limb, including hip: Secondary | ICD-10-CM | POA: Diagnosis not present

## 2021-10-12 DIAGNOSIS — L821 Other seborrheic keratosis: Secondary | ICD-10-CM | POA: Diagnosis not present

## 2021-10-12 DIAGNOSIS — L814 Other melanin hyperpigmentation: Secondary | ICD-10-CM | POA: Diagnosis not present

## 2021-10-12 DIAGNOSIS — D0472 Carcinoma in situ of skin of left lower limb, including hip: Secondary | ICD-10-CM | POA: Diagnosis not present

## 2021-10-12 DIAGNOSIS — L565 Disseminated superficial actinic porokeratosis (DSAP): Secondary | ICD-10-CM | POA: Diagnosis not present

## 2021-10-12 DIAGNOSIS — D485 Neoplasm of uncertain behavior of skin: Secondary | ICD-10-CM | POA: Diagnosis not present

## 2021-10-12 DIAGNOSIS — L570 Actinic keratosis: Secondary | ICD-10-CM | POA: Diagnosis not present

## 2021-10-12 DIAGNOSIS — D1801 Hemangioma of skin and subcutaneous tissue: Secondary | ICD-10-CM | POA: Diagnosis not present

## 2021-10-12 MED ORDER — VILAZODONE HCL 20 MG PO TABS: 1.5000 | ORAL_TABLET | Freq: Every day | ORAL | 0 refills | Status: DC

## 2021-10-12 NOTE — Telephone Encounter (Signed)
Pt informed rx sent.

## 2021-11-17 ENCOUNTER — Telehealth: Payer: Self-pay | Admitting: Adult Health

## 2021-11-17 DIAGNOSIS — D0472 Carcinoma in situ of skin of left lower limb, including hip: Secondary | ICD-10-CM | POA: Diagnosis not present

## 2021-11-17 DIAGNOSIS — L565 Disseminated superficial actinic porokeratosis (DSAP): Secondary | ICD-10-CM | POA: Diagnosis not present

## 2021-11-17 NOTE — Telephone Encounter (Signed)
LVM to RC 

## 2021-11-17 NOTE — Telephone Encounter (Signed)
Pt lvm at at 2:30 pm stating that she has been taking viibryd 330 mg for 3 weeks now and doesn't feel it is working. She would like to increase to 40 mg. She said that her insurance will not cover 135 tablets and that a PA is required. Since she feels like it is not working she said we can increase to 40 and disregard the PA. Please give her a call back at [phone]

## 2021-11-18 NOTE — Telephone Encounter (Signed)
I haven't seen her since March - can we get an appointment set up for her?

## 2021-11-18 NOTE — Telephone Encounter (Signed)
Patient said insurance requiring a PA for more than one tablet a day of Viibryd. She is currently taking 1.5. She said she is sleeping too much, has no interest or energy, is crying (though not as much as previously), and has more anxiety which causes diarrhea. She is asking if she can go up to 40 mg and that would eliminate having to do a PA and may give her more relief.

## 2021-11-18 NOTE — Telephone Encounter (Signed)
Please call patient to schedule an earlier appt with Gina.  

## 2021-11-19 NOTE — Telephone Encounter (Signed)
Pt has an appt in august 4 th

## 2021-12-02 DIAGNOSIS — M7061 Trochanteric bursitis, right hip: Secondary | ICD-10-CM | POA: Diagnosis not present

## 2021-12-02 DIAGNOSIS — S81802S Unspecified open wound, left lower leg, sequela: Secondary | ICD-10-CM | POA: Diagnosis not present

## 2021-12-02 DIAGNOSIS — F331 Major depressive disorder, recurrent, moderate: Secondary | ICD-10-CM | POA: Diagnosis not present

## 2021-12-02 DIAGNOSIS — M7062 Trochanteric bursitis, left hip: Secondary | ICD-10-CM | POA: Diagnosis not present

## 2021-12-02 DIAGNOSIS — Z6836 Body mass index (BMI) 36.0-36.9, adult: Secondary | ICD-10-CM | POA: Diagnosis not present

## 2021-12-02 DIAGNOSIS — R197 Diarrhea, unspecified: Secondary | ICD-10-CM | POA: Diagnosis not present

## 2021-12-03 ENCOUNTER — Ambulatory Visit (INDEPENDENT_AMBULATORY_CARE_PROVIDER_SITE_OTHER): Payer: Medicare Other | Admitting: Adult Health

## 2021-12-03 ENCOUNTER — Encounter: Payer: Self-pay | Admitting: Adult Health

## 2021-12-03 DIAGNOSIS — F411 Generalized anxiety disorder: Secondary | ICD-10-CM

## 2021-12-03 DIAGNOSIS — G4700 Insomnia, unspecified: Secondary | ICD-10-CM | POA: Diagnosis not present

## 2021-12-03 DIAGNOSIS — F331 Major depressive disorder, recurrent, moderate: Secondary | ICD-10-CM

## 2021-12-03 MED ORDER — VILAZODONE HCL 40 MG PO TABS: 40.0000 mg | ORAL_TABLET | Freq: Every day | ORAL | 5 refills | Status: DC

## 2021-12-03 NOTE — Progress Notes (Signed)
Stacey Santana [id number] [birth year] 76 y.o.  Subjective:   Patient ID:  Stacey Santana is a 76 y.o. (DOB 1945/08/22) female.  Chief Complaint: No chief complaint on file.   HPI Stacey Santana presents to the office today for follow-up of GAD, insomnia and MDD.   Describes mood today as "ok". Pleasant. Tearful. Mood symptoms - reports increased anxiety. Reports some depression. Decreased irritability. Reports some worry and rumination. Mood has been lower over the past 2 months.  Stating "I'm not doing as well as I was". Reports increased situational stressors. Increased pain in hips - bursitis. Recently increased Viibryd to $RemoveBe'30mg'KKlDcvVUk$  daily - 2 weeks ago. Feels like medication has been helpful. Stable interest and motivation. Taking medications as prescribed.  Energy levels vary. Active, does not have a regular exercise routine. Enjoys some usual interests and activities. Lives alone. Legally separated for past 14 years. Spending time with family. Appetite adequate - up and down. Weight gain - 200 to 204 pounds. Sleeps well most nights. Averages 9 to 10 hours. Recently got a new CPAP machine. Focus and concentration improved. Completing tasks. Managing aspects of household. Retired. Denies SI or HI.  Denies AH or VH. Working with therapist - [name] - 12/2019.  Previous medication trials: Prozac, Zoloft, Depakote, Lamictal Clonazepam, Xanax, Effexor, Sonata, Wellbutrin, Rexulti, Abilify     Review of Systems:  Review of Systems  Musculoskeletal:  Negative for gait problem.  Neurological:  Negative for tremors.  Psychiatric/Behavioral:         Please refer to HPI    Medications: I have reviewed the patient's current medications.  Current Outpatient Medications  Medication Sig Dispense Refill   Vilazodone HCl (VIIBRYD) 40 MG TABS Take 1 tablet (40 mg total) by mouth daily. 30 tablet 5   ALPRAZolam (XANAX) 0.25 MG tablet Take 1 tablet (0.25 mg total) by mouth 3 (three) times  daily as needed for anxiety. 90 tablet 2   CALCIUM-MAGNESIUM-ZINC PO Take 1 tablet by mouth at bedtime.     Cholecalciferol (VITAMIN D3) 1000 units CAPS Take 1,000 Units by mouth daily.      ibuprofen (ADVIL,MOTRIN) 600 MG tablet Take 600 mg by mouth 2 (two) times daily.     losartan (COZAAR) 100 MG tablet Take 100 mg by mouth at bedtime.      metFORMIN (GLUCOPHAGE) 500 MG tablet Take 500 mg by mouth daily with breakfast.      metoprolol succinate (TOPROL-XL) 50 MG 24 hr tablet Take 25 mg by mouth daily.      Multiple Vitamins-Minerals (EYE VITAMINS & MINERALS PO) Take 1 capsule by mouth daily.     MYRBETRIQ 50 MG TB24 tablet Take 50 mg by mouth daily.      omeprazole (PRILOSEC) 20 MG capsule Take 20 mg by mouth at bedtime.      simvastatin (ZOCOR) 20 MG tablet Take 20 mg by mouth at bedtime.      traMADol (ULTRAM) 50 MG tablet Take 50 mg by mouth at bedtime as needed for moderate pain.   0   trimethoprim (TRIMPEX) 100 MG tablet Take 100 mg by mouth at bedtime.      venlafaxine XR (EFFEXOR XR) 37.5 MG 24 hr capsule Take 1 capsule (37.5 mg total) by mouth daily. 90 capsule 1   zaleplon (SONATA) 10 MG capsule TAKE 1 CAPSULE AT BEDTIME AS NEEDED 30 capsule 0   No current facility-administered medications for this visit.    Medication Side Effects: None  Allergies:  Allergies  Allergen Reactions   Hydroxyzine Other (See Comments)    Dizziness  Other reaction(s): Dizziness   Meloxicam Nausea And Vomiting and Other (See Comments)    Pt. Stated, "Meloxicam causes Nausea nad vomiting." Other reaction(s): Other (See Comments), Vomiting Pt. Stated, "Meloxicam causes Nausea nad vomiting."    Oxycodone-Acetaminophen     Other reaction(s): Other (See Comments)   Amoxicillin-Pot Clavulanate     Other reaction(s): diarrhea   Aripiprazole     Other reaction(s): ineffective, dizziness   Brexpiprazole     Other reaction(s): ineffective   Bupropion Hcl Er (Xl)     Other reaction(s):  ineffective   Clonazepam Other (See Comments)   Darvon [Propoxyphene] Nausea And Vomiting   Escitalopram Oxalate     Other reaction(s): ineffective   Fluoxetine     Other reaction(s): ineffective after 10 yrs   Pollen Extract    Scopolamine Other (See Comments)    Psych. Disturbance ?   Sertraline Hcl     Other reaction(s): ineffective after 10 yrs    Past Medical History:  Diagnosis Date   Anxiety    Arthritis    some degenerative signs in other places of her body    Complication of anesthesia 1993   used scopolamine patch during surgery- manic episode occurred post op , met with the anesth. dept. afterwards & they thought that the episode was related to scop patch.    Depression    chronic    Diabetes mellitus without complication (HCC)    type II   GERD (gastroesophageal reflux disease)    Heart murmur    slight murmur    High cholesterol    Hypertension    Sleep apnea    CPAP     Past Medical History, Surgical history, Social history, and Family history were reviewed and updated as appropriate.   Please see review of systems for further details on the patient's review from today.   Objective:   Physical Exam:  There were no vitals taken for this visit.  Physical Exam Constitutional:      General: She is not in acute distress. Musculoskeletal:        General: No deformity.  Neurological:     Mental Status: She is alert and oriented to person, place, and time.     Coordination: Coordination normal.  Psychiatric:        Attention and Perception: Attention and perception normal. She does not perceive auditory or visual hallucinations.        Mood and Affect: Mood normal. Mood is not anxious or depressed. Affect is not labile, blunt, angry or inappropriate.        Speech: Speech normal.        Behavior: Behavior normal.        Thought Content: Thought content normal. Thought content is not paranoid or delusional. Thought content does not include homicidal or  suicidal ideation. Thought content does not include homicidal or suicidal plan.        Cognition and Memory: Cognition and memory normal.        Judgment: Judgment normal.     Comments: Insight intact     Lab Review:     Component Value Date/Time   NA 139 05/18/2017 1305   K 4.2 05/18/2017 1305   CL 105 05/18/2017 1305   CO2 23 05/18/2017 1305   GLUCOSE 120 (H) 05/18/2017 1305   BUN 12 05/18/2017 1305   CREATININE 0.66 05/18/2017 1305   CALCIUM 9.4 05/18/2017  [place] >60 05/18/2017 1305   GFRAA >60 05/18/2017 1305       Component Value Date/Time   WBC 6.5 05/18/2017 1305   RBC 4.34 05/18/2017 1305   HGB 13.2 05/18/2017 1305   HCT 39.7 05/18/2017 1305   PLT 237 05/18/2017 1305   MCV 91.5 05/18/2017 1305   MCH 30.4 05/18/2017 1305   MCHC 33.2 05/18/2017 1305   RDW 13.3 05/18/2017 1305    No results found for: "POCLITH", "LITHIUM"   No results found for: "PHENYTOIN", "PHENOBARB", "VALPROATE", "CBMZ"   .res Assessment: Plan:    Plan:  PDMP reviewed  1. Effexor XR $RemoveBe'75mg'UdgQhmtbO$  daily  - now at 37.5 - currently tapering off medication. 2. Xanax 0.$RemoveBe'25mg'tJkKQpmMA$  TID - taking more often 3. Viibryd $RemoveBe'30mg'LWtKNhOlR$  to $R'40mg'LF$  daily  Consider adding Cymbalta $RemoveBeforeD'30mg'lFXnWGgRuMAzUs$  daily  Seeing therapist - [name]  RTC 6 months  Time spent with patient was 25 minutes. Greater than 50% of face to face time with patient was spent on counseling and coordination of care.   Patient advised to contact office with any questions, adverse effects, or acute worsening in signs and symptoms.  Diagnoses and all orders for this visit:  Generalized anxiety disorder  Major depressive disorder, recurrent episode, moderate (HCC) -     Vilazodone HCl (VIIBRYD) 40 MG TABS; Take 1 tablet (40 mg total) by mouth daily.  Insomnia, unspecified type     Please see After Visit Summary for patient specific instructions.  Future Appointments  Date Time Provider [place]  02/02/2022  2:00 PM Latrel Szymczak, Berdie Ogren, NP [facility] None  04/11/2022 12:30 PM [facility] MM 3 [facility]  04/11/2022  1:00 PM [facility] DX DEXA 1 [facility]    No orders of the defined types were placed in this encounter.   -------------------------------

## 2021-12-21 DIAGNOSIS — Z961 Presence of intraocular lens: Secondary | ICD-10-CM | POA: Diagnosis not present

## 2021-12-21 DIAGNOSIS — H532 Diplopia: Secondary | ICD-10-CM | POA: Diagnosis not present

## 2022-01-05 ENCOUNTER — Ambulatory Visit: Payer: Medicare Other | Admitting: Adult Health

## 2022-01-07 DIAGNOSIS — M25552 Pain in left hip: Secondary | ICD-10-CM | POA: Diagnosis not present

## 2022-01-07 DIAGNOSIS — M7062 Trochanteric bursitis, left hip: Secondary | ICD-10-CM | POA: Diagnosis not present

## 2022-01-07 DIAGNOSIS — M7061 Trochanteric bursitis, right hip: Secondary | ICD-10-CM | POA: Diagnosis not present

## 2022-01-10 DIAGNOSIS — M25552 Pain in left hip: Secondary | ICD-10-CM | POA: Diagnosis not present

## 2022-01-10 DIAGNOSIS — M7062 Trochanteric bursitis, left hip: Secondary | ICD-10-CM | POA: Diagnosis not present

## 2022-01-10 DIAGNOSIS — M7061 Trochanteric bursitis, right hip: Secondary | ICD-10-CM | POA: Diagnosis not present

## 2022-01-13 DIAGNOSIS — R221 Localized swelling, mass and lump, neck: Secondary | ICD-10-CM | POA: Diagnosis not present

## 2022-01-14 DIAGNOSIS — M7062 Trochanteric bursitis, left hip: Secondary | ICD-10-CM | POA: Diagnosis not present

## 2022-01-14 DIAGNOSIS — M7061 Trochanteric bursitis, right hip: Secondary | ICD-10-CM | POA: Diagnosis not present

## 2022-01-14 DIAGNOSIS — M25552 Pain in left hip: Secondary | ICD-10-CM | POA: Diagnosis not present

## 2022-01-16 DIAGNOSIS — Z23 Encounter for immunization: Secondary | ICD-10-CM | POA: Diagnosis not present

## 2022-01-18 DIAGNOSIS — R221 Localized swelling, mass and lump, neck: Secondary | ICD-10-CM | POA: Diagnosis not present

## 2022-01-21 DIAGNOSIS — M7062 Trochanteric bursitis, left hip: Secondary | ICD-10-CM | POA: Diagnosis not present

## 2022-01-21 DIAGNOSIS — M25552 Pain in left hip: Secondary | ICD-10-CM | POA: Diagnosis not present

## 2022-01-21 DIAGNOSIS — M7061 Trochanteric bursitis, right hip: Secondary | ICD-10-CM | POA: Diagnosis not present

## 2022-01-24 DIAGNOSIS — M25552 Pain in left hip: Secondary | ICD-10-CM | POA: Diagnosis not present

## 2022-01-24 DIAGNOSIS — M7062 Trochanteric bursitis, left hip: Secondary | ICD-10-CM | POA: Diagnosis not present

## 2022-01-24 DIAGNOSIS — M7061 Trochanteric bursitis, right hip: Secondary | ICD-10-CM | POA: Diagnosis not present

## 2022-01-25 ENCOUNTER — Telehealth: Payer: Self-pay | Admitting: Adult Health

## 2022-01-25 NOTE — Telephone Encounter (Signed)
FYI  Pt left v-mail. Continue to have issues with diarrhea. She read 30% of people taking 40 mg will have severe diarrhea. Has apt 10/4. Will discuss then. Just a heads up. Will need to see what other options are. Apt changed to 40 mins

## 2022-01-25 NOTE — Telephone Encounter (Signed)
Noted  

## 2022-01-28 DIAGNOSIS — M7062 Trochanteric bursitis, left hip: Secondary | ICD-10-CM | POA: Diagnosis not present

## 2022-01-28 DIAGNOSIS — M25552 Pain in left hip: Secondary | ICD-10-CM | POA: Diagnosis not present

## 2022-01-28 DIAGNOSIS — M7061 Trochanteric bursitis, right hip: Secondary | ICD-10-CM | POA: Diagnosis not present

## 2022-01-31 DIAGNOSIS — M7062 Trochanteric bursitis, left hip: Secondary | ICD-10-CM | POA: Diagnosis not present

## 2022-01-31 DIAGNOSIS — M25552 Pain in left hip: Secondary | ICD-10-CM | POA: Diagnosis not present

## 2022-01-31 DIAGNOSIS — M7061 Trochanteric bursitis, right hip: Secondary | ICD-10-CM | POA: Diagnosis not present

## 2022-02-02 ENCOUNTER — Ambulatory Visit: Payer: Medicare Other | Admitting: Adult Health

## 2022-02-02 ENCOUNTER — Encounter: Payer: Self-pay | Admitting: Adult Health

## 2022-02-02 DIAGNOSIS — F331 Major depressive disorder, recurrent, moderate: Secondary | ICD-10-CM

## 2022-02-02 DIAGNOSIS — G4700 Insomnia, unspecified: Secondary | ICD-10-CM

## 2022-02-02 DIAGNOSIS — M25552 Pain in left hip: Secondary | ICD-10-CM | POA: Diagnosis not present

## 2022-02-02 DIAGNOSIS — Z23 Encounter for immunization: Secondary | ICD-10-CM | POA: Diagnosis not present

## 2022-02-02 DIAGNOSIS — M1612 Unilateral primary osteoarthritis, left hip: Secondary | ICD-10-CM | POA: Diagnosis not present

## 2022-02-02 DIAGNOSIS — F411 Generalized anxiety disorder: Secondary | ICD-10-CM

## 2022-02-02 DIAGNOSIS — M87852 Other osteonecrosis, left femur: Secondary | ICD-10-CM | POA: Diagnosis not present

## 2022-02-02 MED ORDER — DULOXETINE HCL 30 MG PO CPEP: 30.0000 mg | ORAL_CAPSULE | Freq: Every day | ORAL | 5 refills | Status: DC

## 2022-02-02 NOTE — Progress Notes (Signed)
Tiffani Kadow [id number] 1945-09-14 76 y.o.  Subjective:   Patient ID:  Stacey Santana is a 76 y.o. (DOB [birth year]) female.  Chief Complaint: No chief complaint on file.   HPI Stacey Santana presents to the office today for follow-up of GAD, insomnia and MDD.   Describes mood today as "ok". Pleasant. Tearful. Mood symptoms - reports anxiety, depression and irritability - "a little bit of all of it - nothing serious". Reports decreased worry and rumination. Mood has improved. Stating "I'm doing better than I was". Feels like the addition of Viibryd has been helpful, but can not longer tolerate it due to unresolved diarrhea. Has also tapered off the Effexor. Would like to consider other medication options. Reports increased situational stressors - having hip replacement in December. Feels like medication has been helpful. Stable interest and motivation. Taking medications as prescribed.  Energy levels vary. Active, does not have a regular exercise routine. Enjoys some usual interests and activities. Lives alone. Legally separated for past 14 years. Spending time with family. Appetite adequate - up and down. Weight stable - 200 pounds. Sleeps well most nights. Averages 9 to 10 hours. Using CPAP machine. Focus and concentration "not bad". Completing tasks. Managing aspects of household. Retired. Denies SI or HI.  Denies AH or VH.  Plans to have a hip replacement in December  Previous medication trials: Prozac, Zoloft, Depakote, Lamictal Clonazepam, Xanax, Effexor, Sonata, Wellbutrin, Rexulti, Abilify  Review of Systems:  Review of Systems  Musculoskeletal:  Negative for gait problem.  Neurological:  Negative for tremors.  Psychiatric/Behavioral:         Please refer to HPI    Medications: I have reviewed the patient's current medications.  Current Outpatient Medications  Medication Sig Dispense Refill   DULoxetine (CYMBALTA) 30 MG capsule Take 1 capsule (30 mg total) by mouth  daily. 30 capsule 5   ALPRAZolam (XANAX) 0.25 MG tablet Take 1 tablet (0.25 mg total) by mouth 3 (three) times daily as needed for anxiety. 90 tablet 2   CALCIUM-MAGNESIUM-ZINC PO Take 1 tablet by mouth at bedtime.     Cholecalciferol (VITAMIN D3) 1000 units CAPS Take 1,000 Units by mouth daily.      ibuprofen (ADVIL,MOTRIN) 600 MG tablet Take 600 mg by mouth 2 (two) times daily.     losartan (COZAAR) 100 MG tablet Take 100 mg by mouth at bedtime.      metFORMIN (GLUCOPHAGE) 500 MG tablet Take 500 mg by mouth daily with breakfast.      metoprolol succinate (TOPROL-XL) 50 MG 24 hr tablet Take 25 mg by mouth daily.      Multiple Vitamins-Minerals (EYE VITAMINS & MINERALS PO) Take 1 capsule by mouth daily.     MYRBETRIQ 50 MG TB24 tablet Take 50 mg by mouth daily.      omeprazole (PRILOSEC) 20 MG capsule Take 20 mg by mouth at bedtime.      simvastatin (ZOCOR) 20 MG tablet Take 20 mg by mouth at bedtime.      traMADol (ULTRAM) 50 MG tablet Take 50 mg by mouth at bedtime as needed for moderate pain.   0   trimethoprim (TRIMPEX) 100 MG tablet Take 100 mg by mouth at bedtime.      venlafaxine XR (EFFEXOR XR) 37.5 MG 24 hr capsule Take 1 capsule (37.5 mg total) by mouth daily. 90 capsule 1   Vilazodone HCl (VIIBRYD) 40 MG TABS Take 1 tablet (40 mg total) by mouth daily. 30 tablet 5   zaleplon (SONATA)  10 MG capsule TAKE 1 CAPSULE AT BEDTIME AS NEEDED 30 capsule 0   No current facility-administered medications for this visit.    Medication Side Effects: None  Allergies:  Allergies  Allergen Reactions   Hydroxyzine Other (See Comments)    Dizziness  Other reaction(s): Dizziness   Meloxicam Nausea And Vomiting and Other (See Comments)    Pt. Stated, "Meloxicam causes Nausea nad vomiting." Other reaction(s): Other (See Comments), Vomiting Pt. Stated, "Meloxicam causes Nausea nad vomiting."    Oxycodone-Acetaminophen     Other reaction(s): Other (See Comments)   Amoxicillin-Pot Clavulanate      Other reaction(s): diarrhea   Aripiprazole     Other reaction(s): ineffective, dizziness   Brexpiprazole     Other reaction(s): ineffective   Bupropion Hcl Er (Xl)     Other reaction(s): ineffective   Clonazepam Other (See Comments)   Darvon [Propoxyphene] Nausea And Vomiting   Escitalopram Oxalate     Other reaction(s): ineffective   Fluoxetine     Other reaction(s): ineffective after 10 yrs   Pollen Extract    Scopolamine Other (See Comments)    Psych. Disturbance ?   Sertraline Hcl     Other reaction(s): ineffective after 10 yrs    Past Medical History:  Diagnosis Date   Anxiety    Arthritis    some degenerative signs in other places of her body    Complication of anesthesia 1993   used scopolamine patch during surgery- manic episode occurred post op , met with the anesth. dept. afterwards & they thought that the episode was related to scop patch.    Depression    chronic    Diabetes mellitus without complication (HCC)    type II   GERD (gastroesophageal reflux disease)    Heart murmur    slight murmur    High cholesterol    Hypertension    Sleep apnea    CPAP     Past Medical History, Surgical history, Social history, and Family history were reviewed and updated as appropriate.   Please see review of systems for further details on the patient's review from today.   Objective:   Physical Exam:  There were no vitals taken for this visit.  Physical Exam Constitutional:      General: She is not in acute distress. Musculoskeletal:        General: No deformity.  Neurological:     Mental Status: She is alert and oriented to person, place, and time.     Coordination: Coordination normal.  Psychiatric:        Attention and Perception: Attention and perception normal. She does not perceive auditory or visual hallucinations.        Mood and Affect: Mood normal. Mood is not anxious or depressed. Affect is not labile, blunt, angry or inappropriate.         Speech: Speech normal.        Behavior: Behavior normal.        Thought Content: Thought content normal. Thought content is not paranoid or delusional. Thought content does not include homicidal or suicidal ideation. Thought content does not include homicidal or suicidal plan.        Cognition and Memory: Cognition and memory normal.        Judgment: Judgment normal.     Comments: Insight intact     Lab Review:     Component Value Date/Time   NA 139 05/18/2017 1305   K 4.2 05/18/2017 1305  CL 105 05/18/2017 1305   CO2 23 05/18/2017 1305   GLUCOSE 120 (H) 05/18/2017 1305   BUN 12 05/18/2017 1305   CREATININE 0.66 05/18/2017 1305   CALCIUM 9.4 05/18/2017 1305   GFRNONAA >60 05/18/2017 1305   GFRAA >60 05/18/2017 1305       Component Value Date/Time   WBC 6.5 05/18/2017 1305   RBC 4.34 05/18/2017 1305   HGB 13.2 05/18/2017 1305   HCT 39.7 05/18/2017 1305   PLT 237 05/18/2017 1305   MCV 91.5 05/18/2017 1305   MCH 30.4 05/18/2017 1305   MCHC 33.2 05/18/2017 1305   RDW 13.3 05/18/2017 1305    No results found for: "POCLITH", "LITHIUM"   No results found for: "PHENYTOIN", "PHENOBARB", "VALPROATE", "CBMZ"   .res Assessment: Plan:    Plan:  PDMP reviewed  D/C Effexor - tapered off D/C Viibryd - diarrhea  Continue - Xanax 0.$RemoveB'25mg'JcKNmgkj$  TID - taking more often  Add Cymbalta $RemoveBefo'30mg'jlPlhhauqVd$  daily  Seeing therapist - [name]  RTC 6 weeks  Time spent with patient was 25 minutes. Greater than 50% of face to face time with patient was spent on counseling and coordination of care.   Patient advised to contact office with any questions, adverse effects, or acute worsening in signs and symptoms.  Diagnoses and all orders for this visit:  Major depressive disorder, recurrent episode, moderate (HCC) -     DULoxetine (CYMBALTA) 30 MG capsule; Take 1 capsule (30 mg total) by mouth daily.  Generalized anxiety disorder -     DULoxetine (CYMBALTA) 30 MG capsule; Take 1 capsule (30 mg  total) by mouth daily.  Insomnia, unspecified type     Please see After Visit Summary for patient specific instructions.  Future Appointments  Date Time Provider [place]  04/11/2022 12:30 PM [facility] MM 3 [facility]  04/11/2022  1:00 PM [facility] DX DEXA 1 [facility]    No orders of the defined types were placed in this encounter.   -------------------------------

## 2022-02-03 DIAGNOSIS — M47812 Spondylosis without myelopathy or radiculopathy, cervical region: Secondary | ICD-10-CM | POA: Diagnosis not present

## 2022-02-04 DIAGNOSIS — M25552 Pain in left hip: Secondary | ICD-10-CM | POA: Diagnosis not present

## 2022-02-04 DIAGNOSIS — M7062 Trochanteric bursitis, left hip: Secondary | ICD-10-CM | POA: Diagnosis not present

## 2022-02-04 DIAGNOSIS — M7061 Trochanteric bursitis, right hip: Secondary | ICD-10-CM | POA: Diagnosis not present

## 2022-02-13 ENCOUNTER — Other Ambulatory Visit: Payer: Self-pay | Admitting: Adult Health

## 2022-02-13 DIAGNOSIS — F411 Generalized anxiety disorder: Secondary | ICD-10-CM

## 2022-02-13 DIAGNOSIS — F331 Major depressive disorder, recurrent, moderate: Secondary | ICD-10-CM

## 2022-02-14 DIAGNOSIS — R800 Isolated proteinuria: Secondary | ICD-10-CM | POA: Diagnosis not present

## 2022-02-14 DIAGNOSIS — N362 Urethral caruncle: Secondary | ICD-10-CM | POA: Diagnosis not present

## 2022-02-14 DIAGNOSIS — N3946 Mixed incontinence: Secondary | ICD-10-CM | POA: Diagnosis not present

## 2022-02-14 DIAGNOSIS — N3281 Overactive bladder: Secondary | ICD-10-CM | POA: Diagnosis not present

## 2022-02-14 DIAGNOSIS — N952 Postmenopausal atrophic vaginitis: Secondary | ICD-10-CM | POA: Diagnosis not present

## 2022-02-15 DIAGNOSIS — M7062 Trochanteric bursitis, left hip: Secondary | ICD-10-CM | POA: Diagnosis not present

## 2022-02-15 DIAGNOSIS — M25552 Pain in left hip: Secondary | ICD-10-CM | POA: Diagnosis not present

## 2022-02-15 DIAGNOSIS — M7061 Trochanteric bursitis, right hip: Secondary | ICD-10-CM | POA: Diagnosis not present

## 2022-02-18 DIAGNOSIS — M7061 Trochanteric bursitis, right hip: Secondary | ICD-10-CM | POA: Diagnosis not present

## 2022-02-18 DIAGNOSIS — M25552 Pain in left hip: Secondary | ICD-10-CM | POA: Diagnosis not present

## 2022-02-18 DIAGNOSIS — M7062 Trochanteric bursitis, left hip: Secondary | ICD-10-CM | POA: Diagnosis not present

## 2022-02-22 DIAGNOSIS — M7061 Trochanteric bursitis, right hip: Secondary | ICD-10-CM | POA: Diagnosis not present

## 2022-02-22 DIAGNOSIS — M7062 Trochanteric bursitis, left hip: Secondary | ICD-10-CM | POA: Diagnosis not present

## 2022-02-22 DIAGNOSIS — M25552 Pain in left hip: Secondary | ICD-10-CM | POA: Diagnosis not present

## 2022-02-25 ENCOUNTER — Other Ambulatory Visit: Payer: Self-pay | Admitting: Adult Health

## 2022-02-25 DIAGNOSIS — F331 Major depressive disorder, recurrent, moderate: Secondary | ICD-10-CM

## 2022-02-25 DIAGNOSIS — M7062 Trochanteric bursitis, left hip: Secondary | ICD-10-CM | POA: Diagnosis not present

## 2022-02-25 DIAGNOSIS — M25552 Pain in left hip: Secondary | ICD-10-CM | POA: Diagnosis not present

## 2022-02-25 DIAGNOSIS — F411 Generalized anxiety disorder: Secondary | ICD-10-CM

## 2022-02-25 DIAGNOSIS — M7061 Trochanteric bursitis, right hip: Secondary | ICD-10-CM | POA: Diagnosis not present

## 2022-03-01 DIAGNOSIS — E538 Deficiency of other specified B group vitamins: Secondary | ICD-10-CM | POA: Diagnosis not present

## 2022-03-01 DIAGNOSIS — E1159 Type 2 diabetes mellitus with other circulatory complications: Secondary | ICD-10-CM | POA: Diagnosis not present

## 2022-03-07 DIAGNOSIS — F3341 Major depressive disorder, recurrent, in partial remission: Secondary | ICD-10-CM | POA: Diagnosis not present

## 2022-03-07 DIAGNOSIS — E785 Hyperlipidemia, unspecified: Secondary | ICD-10-CM | POA: Diagnosis not present

## 2022-03-07 DIAGNOSIS — M1612 Unilateral primary osteoarthritis, left hip: Secondary | ICD-10-CM | POA: Diagnosis not present

## 2022-03-07 DIAGNOSIS — G4733 Obstructive sleep apnea (adult) (pediatric): Secondary | ICD-10-CM | POA: Diagnosis not present

## 2022-03-07 DIAGNOSIS — N3941 Urge incontinence: Secondary | ICD-10-CM | POA: Diagnosis not present

## 2022-03-07 DIAGNOSIS — E669 Obesity, unspecified: Secondary | ICD-10-CM | POA: Diagnosis not present

## 2022-03-07 DIAGNOSIS — Z6835 Body mass index (BMI) 35.0-35.9, adult: Secondary | ICD-10-CM | POA: Diagnosis not present

## 2022-03-07 DIAGNOSIS — E1159 Type 2 diabetes mellitus with other circulatory complications: Secondary | ICD-10-CM | POA: Diagnosis not present

## 2022-03-07 DIAGNOSIS — I10 Essential (primary) hypertension: Secondary | ICD-10-CM | POA: Diagnosis not present

## 2022-03-07 DIAGNOSIS — K219 Gastro-esophageal reflux disease without esophagitis: Secondary | ICD-10-CM | POA: Diagnosis not present

## 2022-03-15 DIAGNOSIS — E1159 Type 2 diabetes mellitus with other circulatory complications: Secondary | ICD-10-CM | POA: Diagnosis not present

## 2022-03-15 DIAGNOSIS — K219 Gastro-esophageal reflux disease without esophagitis: Secondary | ICD-10-CM | POA: Diagnosis not present

## 2022-03-15 DIAGNOSIS — I10 Essential (primary) hypertension: Secondary | ICD-10-CM | POA: Diagnosis not present

## 2022-03-15 DIAGNOSIS — E785 Hyperlipidemia, unspecified: Secondary | ICD-10-CM | POA: Diagnosis not present

## 2022-03-15 DIAGNOSIS — F3342 Major depressive disorder, recurrent, in full remission: Secondary | ICD-10-CM | POA: Diagnosis not present

## 2022-03-16 ENCOUNTER — Ambulatory Visit (INDEPENDENT_AMBULATORY_CARE_PROVIDER_SITE_OTHER): Payer: Self-pay | Admitting: Adult Health

## 2022-03-16 DIAGNOSIS — F489 Nonpsychotic mental disorder, unspecified: Secondary | ICD-10-CM

## 2022-03-16 NOTE — Progress Notes (Signed)
Patient no show appointment. ? ?

## 2022-03-30 NOTE — Progress Notes (Addendum)
Anesthesia Review:  OZD:GUYQI [employer] and requested most recent ov note from office and from [facility] for [facility] and requested clearance. LOV 03/07/22 on chart  and 12/02/21 on chart and 09/01/21 on chart and 07/19/21 on chart. Clearance on chart  Cardiologist : none  Chest x-ray : EKG : 04/04/22  Echo : Stress test: Cardiac Cath :  Activity level: can do a flight of stairs without difficutly  Sleep Study/ CPAP : has cpap  Fasting Blood Sugar :      / Checks Blood Sugar -- times a day:   Blood Thinner/ Instructions /Last Dose: ASA / Instructions/ Last Dose :  DM- type 2 - checks glucose 1-2 times per week in am  Hgba1c- 04/04/22- 6.6    AT preop appt on 04/04/22 initial blood pressure was 160/111 in left arm.  Upon recheck blood pressure was 173/90.  PT denies any chest pain, shortness of breath, dizziness , headache or blurred vision.  Blood pressure check in roght arm was 179/93.  PT states is is nervous.  .  REceived ov notes from PCP and noted blood pressure runs 120-70-80 at PCP office.  Called pt and [facility] and informed pt to keep a check on blood pressure readings since elevated at preop appt even though pt stated she was nervous  and to bein touch with PCP if blood pressure does not decrease.  Asked pt for a return call.   PT called back on 04/05/22 at 1200 and [facility] and stated her blood pressure today was 138/80 with a pulse of 63.

## 2022-03-31 NOTE — Patient Instructions (Signed)
SURGICAL WAITING ROOM VISITATION Patients having surgery or a procedure may have no more than 2 support people in the waiting area - these visitors may rotate.   Children under the age of 65 must have an adult with them who is not the patient. If the patient needs to stay at the hospital during part of their recovery, the visitor guidelines for inpatient rooms apply. Pre-op nurse will coordinate an appropriate time for 1 support person to accompany patient in pre-op.  This support person may not rotate.    Please refer to the [facility] website for the visitor guidelines for Inpatients (after your surgery is over and you are in a regular room).       Your procedure is scheduled on:  04/12/2022    Report to [facility] Main Entrance    Report to admitting at   [phone]   Call this number if you have problems the morning of surgery [phone]   Do not eat food :After Midnight.   After Midnight you may have the following liquids until ___ 0530___ AM DAY OF SURGERY  Water Non-Citrus Juices (without pulp, NO RED) Carbonated Beverages Black Coffee (NO MILK/CREAM OR CREAMERS, sugar ok)  Clear Tea (NO MILK/CREAM OR CREAMERS, sugar ok) regular and decaf                             Plain Jell-O (NO RED)                                           Fruit ices (not with fruit pulp, NO RED)                                     Popsicles (NO RED)                                                               Sports drinks like Gatorade (NO RED)                    The day of surgery:  Drink ONE (1) Pre-Surgery Clear Ensure or G2   0530 AM ( have completed by )  the morning of surgery. Drink in one sitting. Do not sip.  This drink was given to you during your hospital  pre-op appointment visit. Nothing else to drink after completing the  Pre-Surgery Clear Ensure or G2.          If you have questions, please contact your surgeon's office.   FOLLOW BOWEL PREP AND ANY ADDITIONAL PRE OP  INSTRUCTIONS YOU RECEIVED     Oral Hygiene is also important to reduce your risk of infection.                                    Remember - BRUSH YOUR TEETH THE MORNING OF SURGERY WITH YOUR REGULAR TOOTHPASTE   Do NOT smoke after Midnight   Take these medicines the morning of surgery with  A SIP OF WATER:  cymbalta, toprol, myrbetriq, effexor   DO NOT TAKE ANY ORAL DIABETIC MEDICATIONS DAY OF YOUR SURGERY  Bring CPAP mask and tubing day of surgery.                              You may not have any metal on your body including hair pins, jewelry, and body piercing             Do not wear make-up, lotions, powders, perfumes/cologne, or deodorant  Do not wear nail polish including gel and S&S, artificial/acrylic nails, or any other type of covering on natural nails including finger and toenails. If you have artificial nails, gel coating, etc. that needs to be removed by a nail salon please have this removed prior to surgery or surgery may need to be canceled/ delayed if the surgeon/ anesthesia feels like they are unable to be safely monitored.   Do not shave  48 hours prior to surgery.               Men may shave face and neck.   Do not bring valuables to the hospital. [place].   Contacts, dentures or bridgework may not be worn into surgery.   Bring small overnight bag day of surgery.   DO NOT [place]. PHARMACY WILL DISPENSE MEDICATIONS LISTED ON YOUR MEDICATION LIST TO YOU DURING YOUR ADMISSION [place]!    Patients discharged on the day of surgery will not be allowed to drive home.  Someone NEEDS to stay with you for the first 24 hours after anesthesia.   Special Instructions: Bring a copy of your healthcare power of attorney and living will documents the day of surgery if you haven't scanned them before.              Please read over the following fact sheets you were given: IF [place] [phone]   If you received a COVID test during your pre-op visit  it is requested that you wear a mask when out in public, stay away from anyone that may not be feeling well and notify your surgeon if you develop symptoms. If you test positive for Covid or have been in contact with anyone that has tested positive in the last 10 days please notify you surgeon.    [place] - Preparing for Surgery Before surgery, you can play an important role.  Because skin is not sterile, your skin needs to be as free of germs as possible.  You can reduce the number of germs on your skin by washing with CHG (chlorahexidine gluconate) soap before surgery.  CHG is an antiseptic cleaner which kills germs and bonds with the skin to continue killing germs even after washing. Please DO NOT use if you have an allergy to CHG or antibacterial soaps.  If your skin becomes reddened/irritated stop using the CHG and inform your nurse when you arrive at Short Stay. Do not shave (including legs and underarms) for at least 48 hours prior to the first CHG shower.  You may shave your face/neck. Please follow these instructions carefully:  1.  Shower with CHG Soap the night before surgery and the  morning of Surgery.  2.  If you choose  to wash your hair, wash your hair first as usual with your  normal  shampoo.  3.  After you shampoo, rinse your hair and body thoroughly to remove the  shampoo.                           4.  Use CHG as you would any other liquid soap.  You can apply chg directly  to the skin and wash                       Gently with a scrungie or clean washcloth.  5.  Apply the CHG Soap to your body ONLY FROM THE NECK DOWN.   Do not use on face/ open                           Wound or open sores. Avoid contact with eyes, ears mouth and genitals (private parts).                       Wash face,  Genitals (private parts) with your normal soap.             6.   Wash thoroughly, paying special attention to the area where your surgery  will be performed.  7.  Thoroughly rinse your body with warm water from the neck down.  8.  DO NOT shower/wash with your normal soap after using and rinsing off  the CHG Soap.                9.  Pat yourself dry with a clean towel.            10.  Wear clean pajamas.            11.  Place clean sheets on your bed the night of your first shower and do not  sleep with pets. Day of Surgery : Do not apply any lotions/deodorants the morning of surgery.  Please wear clean clothes to the hospital/surgery center.  FAILURE TO FOLLOW THESE INSTRUCTIONS MAY RESULT IN THE CANCELLATION OF YOUR SURGERY PATIENT SIGNATURE_________________________________  NURSE SIGNATURE__________________________________  ________________________________________________________________________

## 2022-04-01 ENCOUNTER — Other Ambulatory Visit: Payer: Self-pay | Admitting: Adult Health

## 2022-04-01 DIAGNOSIS — F411 Generalized anxiety disorder: Secondary | ICD-10-CM

## 2022-04-01 DIAGNOSIS — G4700 Insomnia, unspecified: Secondary | ICD-10-CM

## 2022-04-01 NOTE — Telephone Encounter (Signed)
Please schedule appt

## 2022-04-04 ENCOUNTER — Encounter (HOSPITAL_COMMUNITY): Payer: Self-pay

## 2022-04-04 ENCOUNTER — Other Ambulatory Visit: Payer: Self-pay

## 2022-04-04 ENCOUNTER — Encounter (HOSPITAL_COMMUNITY)
Admission: RE | Admit: 2022-04-04 | Discharge: 2022-04-04 | Disposition: A | Discharge status: Home / Self Care | Payer: Medicare Other | Source: Ambulatory Visit | Attending: Orthopedic Surgery | Admitting: Orthopedic Surgery

## 2022-04-04 VITALS — BP 173/90 | HR 68 | Temp 98.3°F | Resp 16 | Ht 63.75 in | Wt 194.0 lb

## 2022-04-04 DIAGNOSIS — M1612 Unilateral primary osteoarthritis, left hip: Secondary | ICD-10-CM

## 2022-04-04 DIAGNOSIS — E139 Other specified diabetes mellitus without complications: Secondary | ICD-10-CM | POA: Diagnosis not present

## 2022-04-04 DIAGNOSIS — Z01818 Encounter for other preprocedural examination: Secondary | ICD-10-CM | POA: Diagnosis not present

## 2022-04-04 HISTORY — DX: Peripheral vascular disease, unspecified: I73.9

## 2022-04-04 LAB — SURGICAL PCR SCREEN
MRSA, PCR: NEGATIVE
Staphylococcus aureus: NEGATIVE

## 2022-04-04 LAB — BASIC METABOLIC PANEL
Anion gap: 7 (ref 5–15)
BUN: 20 mg/dL (ref 8–23)
CO2: 25 mmol/L (ref 22–32)
Calcium: 9.5 mg/dL (ref 8.9–10.3)
Chloride: 108 mmol/L (ref 98–111)
Creatinine, Ser: 0.58 mg/dL (ref 0.44–1.00)
GFR, Estimated: >60 mL/min (ref >60)
Glucose, Bld: 109 mg/dL — ABNORMAL HIGH (ref 70–99)
Potassium: 4.1 mmol/L (ref 3.5–5.1)
Sodium: 140 mmol/L (ref 135–145)

## 2022-04-04 LAB — CBC
HCT: 39.4 % (ref 36.0–46.0)
Hemoglobin: 12.6 g/dL (ref 12.0–15.0)
MCH: 29.6 pg (ref 26.0–34.0)
MCHC: 32 g/dL (ref 30.0–36.0)
MCV: 92.7 fL (ref 80.0–100.0)
Platelets: 267 10*3/uL (ref 150–400)
RBC: 4.25 MIL/uL (ref 3.87–5.11)
RDW: 13 % (ref 11.5–15.5)
WBC: 8 10*3/uL (ref 4.0–10.5)
nRBC: 0 % (ref 0.0–0.2)

## 2022-04-04 LAB — GLUCOSE, CAPILLARY: Glucose-Capillary: 105 mg/dL — ABNORMAL HIGH (ref 70–99)

## 2022-04-05 ENCOUNTER — Encounter: Payer: Self-pay | Admitting: Adult Health

## 2022-04-05 ENCOUNTER — Ambulatory Visit (INDEPENDENT_AMBULATORY_CARE_PROVIDER_SITE_OTHER): Payer: Medicare Other | Admitting: Adult Health

## 2022-04-05 DIAGNOSIS — F411 Generalized anxiety disorder: Secondary | ICD-10-CM

## 2022-04-05 DIAGNOSIS — F331 Major depressive disorder, recurrent, moderate: Secondary | ICD-10-CM

## 2022-04-05 DIAGNOSIS — G4700 Insomnia, unspecified: Secondary | ICD-10-CM

## 2022-04-05 LAB — HEMOGLOBIN A1C
Hgb A1c MFr Bld: 6.6 % — ABNORMAL HIGH (ref 4.8–5.6)
Mean Plasma Glucose: 143 mg/dL

## 2022-04-05 MED ORDER — DULOXETINE HCL 30 MG PO CPEP: 30.0000 mg | ORAL_CAPSULE | Freq: Two times a day (BID) | ORAL | 1 refills | Status: DC

## 2022-04-05 NOTE — Progress Notes (Signed)
Stacey Santana [id number] 07-11-1945 76 y.o.  Subjective:   Patient ID:  Stacey Santana is a 76 y.o. (DOB 03-Aug-1945) female.  Chief Complaint: No chief complaint on file.   HPI [name] presents to the office today for follow-up of GAD, insomnia and MDD.   Describes mood today as "ok". Pleasant. Tearful. Mood symptoms - reports anxiety - "surgery related". Denies anxiety. Reports irritability at times. Reports some worry and rumination - mostly surrounding upcoming surgery". Mood is stable. Stating "I'm doing alright". Feels like the Cymbalta at 64m is helpful to manage mood symptoms.Preparing to have hip replacement - December 12. Feels like medication has been helpful. Stable interest and motivation. Taking medications as prescribed.  Energy levels better in the mornings. Active, does not have a regular exercise routine. Enjoys some usual interests and activities. Lives alone. Legally separated for past 14 years. Spending time with family. Appetite adequate - up and down. Weight stable - 196 pounds. Sleeps well most nights. Averages 9 to 10 hours. Using CPAP machine. Focus and concentration " a little wishy-washy". Completing tasks. Managing aspects of household. Retired. Denies SI or HI.  Denies AH or VH.  Previous medication trials: Prozac, Zoloft, Depakote, Lamictal Clonazepam, Xanax, Effexor, Sonata, Wellbutrin, Rexulti, Abilify    Review of Systems:  Review of Systems  Musculoskeletal:  Negative for gait problem.  Neurological:  Negative for tremors.  Psychiatric/Behavioral:         Please refer to HPI    Medications: I have reviewed the patient's current medications.  Current Outpatient Medications  Medication Sig Dispense Refill   ALPRAZolam (XANAX) 0.25 MG tablet TAKE 1 TABLET BY MOUTH 3 TIMES DAILY AS NEEDED FOR ANXIETY. 90 tablet 0   CALCIUM-MAGNESIUM-ZINC PO Take 1 tablet by mouth at bedtime.     Cholecalciferol (VITAMIN D3) 1000 units CAPS Take 1,000 Units  by mouth daily.      diclofenac (VOLTAREN) 75 MG EC tablet Take 75 mg by mouth daily as needed for mild pain.     DULoxetine (CYMBALTA) 30 MG capsule TAKE 1 CAPSULE BY MOUTH EVERY DAY 90 capsule 0   ibuprofen (ADVIL) 200 MG tablet Take 800 mg by mouth daily.     losartan (COZAAR) 100 MG tablet Take 100 mg by mouth at bedtime.      Melatonin 10 MG TABS Take 10 mg by mouth at bedtime.     metFORMIN (GLUCOPHAGE-XR) 500 MG 24 hr tablet Take 500 mg by mouth 2 (two) times daily with a meal.     Methylcobalamin 1000 MCG LOZG Take 1,000 mcg by mouth every Monday, Wednesday, and Friday.     metoprolol succinate (TOPROL-XL) 25 MG 24 hr tablet Take 25 mg by mouth daily.     Multiple Vitamins-Minerals (PRESERVISION AREDS 2) CAPS Take 1 capsule by mouth 2 (two) times daily.     MYRBETRIQ 50 MG TB24 tablet Take 50 mg by mouth daily.      omeprazole (PRILOSEC) 20 MG capsule Take 20 mg by mouth at bedtime.      OVER THE COUNTER MEDICATION Apply 1 Application topically daily as needed (pain). CBD cream     Probiotic Product (PROBIOTIC PO) Take 1 capsule by mouth daily.     simvastatin (ZOCOR) 20 MG tablet Take 20 mg by mouth at bedtime.      traMADol (ULTRAM) 50 MG tablet Take 50 mg by mouth at bedtime as needed for moderate pain.   0   venlafaxine XR (EFFEXOR-XR) 37.5 MG 24 hr  capsule TAKE 1 CAPSULE BY MOUTH EVERY DAY (Patient not taking: Reported on 04/01/2022) 90 capsule 0   Vilazodone HCl (VIIBRYD) 40 MG TABS Take 1 tablet (40 mg total) by mouth daily. (Patient not taking: Reported on 04/01/2022) 30 tablet 5   zaleplon (SONATA) 10 MG capsule TAKE 1 CAPSULE AT BEDTIME AS NEEDED (Patient not taking: Reported on 04/01/2022) 30 capsule 0   No current facility-administered medications for this visit.    Medication Side Effects: None  Allergies:  Allergies  Allergen Reactions   Hydroxyzine Other (See Comments)    Dizziness    Meloxicam Nausea And Vomiting        Oxycodone-Acetaminophen     Didn't work    Amoxicillin-Pot Clavulanate Diarrhea   Aripiprazole     ineffective, dizziness   Brexpiprazole     Ineffective   Bupropion Hcl Er (Xl)     ineffective   Clonazepam Other (See Comments)    dizziness   Darvon [Propoxyphene] Nausea And Vomiting   Escitalopram Oxalate     ineffective   Fluoxetine     ineffective after 10 yrs   Pollen Extract    Scopolamine Other (See Comments)    Psych. Disturbance ?   Sertraline Hcl     ineffective after 10 yrs   Viibryd [Vilazodone Hcl] Diarrhea    Past Medical History:  Diagnosis Date   Anxiety    Arthritis    some degenerative signs in other places of her body    Complication of anesthesia 1993   used scopolamine patch during surgery- manic episode occurred post op , met with the anesth. dept. afterwards & they thought that the episode was related to scop patch.    Depression    chronic    Diabetes mellitus without complication (HCC)    type II   GERD (gastroesophageal reflux disease)    High cholesterol    Hypertension    Peripheral vascular disease (HCC)    Sleep apnea    CPAP     Past Medical History, Surgical history, Social history, and Family history were reviewed and updated as appropriate.   Please see review of systems for further details on the patient's review from today.   Objective:   Physical Exam:  There were no vitals taken for this visit.  Physical Exam Constitutional:      General: She is not in acute distress. Musculoskeletal:        General: No deformity.  Neurological:     Mental Status: She is alert and oriented to person, place, and time.     Coordination: Coordination normal.  Psychiatric:        Attention and Perception: Attention and perception normal. She does not perceive auditory or visual hallucinations.        Mood and Affect: Mood normal. Mood is not anxious or depressed. Affect is not labile, blunt, angry or inappropriate.        Speech: Speech normal.        Behavior: Behavior normal.         Thought Content: Thought content normal. Thought content is not paranoid or delusional. Thought content does not include homicidal or suicidal ideation. Thought content does not include homicidal or suicidal plan.        Cognition and Memory: Cognition and memory normal.        Judgment: Judgment normal.     Comments: Insight intact     Lab Review:     Component Value Date/Time  NA 140 04/04/2022 1330   K 4.1 04/04/2022 1330   CL 108 04/04/2022 1330   CO2 25 04/04/2022 1330   GLUCOSE 109 (H) 04/04/2022 1330   BUN 20 04/04/2022 1330   CREATININE 0.58 04/04/2022 1330   CALCIUM 9.5 04/04/2022 1330   GFRNONAA >60 04/04/2022 1330   GFRAA >60 05/18/2017 1305       Component Value Date/Time   WBC 8.0 04/04/2022 1330   RBC 4.25 04/04/2022 1330   HGB 12.6 04/04/2022 1330   HCT 39.4 04/04/2022 1330   PLT 267 04/04/2022 1330   MCV 92.7 04/04/2022 1330   MCH 29.6 04/04/2022 1330   MCHC 32.0 04/04/2022 1330   RDW 13.0 04/04/2022 1330    No results found for: "POCLITH", "LITHIUM"   No results found for: "PHENYTOIN", "PHENOBARB", "VALPROATE", "CBMZ"   .res Assessment: Plan:    Plan:  PDMP reviewed  Xanax 0.59m TID - taking more often  Cymbalta 364mdaily to BID  Seeing therapist - [name]  RTC 6 months  Time spent with patient was 20 minutes. Greater than 50% of face to face time with patient was spent on counseling and coordination of care.   Patient advised to contact office with any questions, adverse effects, or acute worsening in signs and symptoms.  There are no diagnoses linked to this encounter.   Please see After Visit Summary for patient specific instructions.  Future Appointments  Date Time [place]  1:00 PM [facility] MM 2 [facility]  07/15/2022  2:30 PM [facility] DX DEXA 1 [facility]    No orders of the defined types were placed in this encounter.   -------------------------------

## 2022-04-05 NOTE — Telephone Encounter (Signed)
Appt today last filled 2022

## 2022-04-08 ENCOUNTER — Ambulatory Visit
Admission: RE | Admit: 2022-04-08 | Discharge: 2022-04-08 | Disposition: A | Discharge status: Home / Self Care | Payer: Medicare Other | Source: Ambulatory Visit | Attending: Family Medicine | Admitting: Family Medicine

## 2022-04-08 DIAGNOSIS — Z1231 Encounter for screening mammogram for malignant neoplasm of breast: Secondary | ICD-10-CM | POA: Diagnosis not present

## 2022-04-10 NOTE — H&P (Signed)
TOTAL HIP ADMISSION H&P  Patient is admitted for left total hip arthroplasty.  Subjective:  Chief Complaint: left hip pain  HPI: Stacey Santana, 76 y.o. female, has a history of pain and functional disability in the left hip(s) due to arthritis and patient has failed non-surgical conservative treatments for greater than 12 weeks to include NSAID's and/or analgesics and activity modification.  Onset of symptoms was gradual starting 2 years ago with gradually worsening course since that time.The patient noted no past surgery on the left hip(s).  Patient currently rates pain in the left hip at 8 out of 10 with activity. Patient has worsening of pain with activity and weight bearing, pain that interfers with activities of daily living, and pain with passive range of motion. Patient has evidence of joint space narrowing by imaging studies. This condition presents safety issues increasing the risk of falls. There is no current active infection.  Patient Active Problem List   Diagnosis Date Noted   MDD (major depressive disorder), recurrent episode, moderate (HCC) 08/25/2020   Encounter for medication adjustment 08/25/2020   Status post total shoulder arthroplasty 05/25/2017   Past Medical History:  Diagnosis Date   Anxiety    Arthritis    some degenerative signs in other places of her body    Complication of anesthesia 1993   used scopolamine patch during surgery- manic episode occurred post op , met with the anesth. dept. afterwards & they thought that the episode was related to scop patch.    Depression    chronic    Diabetes mellitus without complication (HCC)    type II   GERD (gastroesophageal reflux disease)    High cholesterol    Hypertension    Peripheral vascular disease (HCC)    Sleep apnea    CPAP     Past Surgical History:  Procedure Laterality Date   BUNIONECTOMY Bilateral    early 1990's    FOOT SURGERY     hammertoe - R foot & bunionectomy on both feet    REDUCTION  MAMMAPLASTY Bilateral 1993?   TOTAL SHOULDER ARTHROPLASTY Left 05/25/2017   TOTAL SHOULDER ARTHROPLASTY Left 05/25/2017   Procedure: LEFT TOTAL SHOULDER ARTHROPLASTY;  Surgeon: Supple, Kevin, MD;  Location: [facility] OR;  Service: Orthopedics;  Laterality: Left;   TUBAL LIGATION      No current facility-administered medications for this encounter.   Current Outpatient Medications  Medication Sig Dispense Refill Last Dose   CALCIUM-MAGNESIUM-ZINC PO Take 1 tablet by mouth at bedtime.      Cholecalciferol (VITAMIN D3) 1000 units CAPS Take 1,000 Units by mouth daily.       diclofenac (VOLTAREN) 75 MG EC tablet Take 75 mg by mouth daily as needed for mild pain.      ibuprofen (ADVIL) 200 MG tablet Take 800 mg by mouth daily.      losartan (COZAAR) 100 MG tablet Take 100 mg by mouth at bedtime.       Melatonin 10 MG TABS Take 10 mg by mouth at bedtime.      metFORMIN (GLUCOPHAGE-XR) 500 MG 24 hr tablet Take 500 mg by mouth 2 (two) times daily with a meal.      Methylcobalamin 1000 MCG LOZG Take 1,000 mcg by mouth every Monday, Wednesday, and Friday.      metoprolol succinate (TOPROL-XL) 25 MG 24 hr tablet Take 25 mg by mouth daily.      Multiple Vitamins-Minerals (PRESERVISION AREDS 2) CAPS Take 1 capsule by mouth 2 (two) times   daily.      MYRBETRIQ 50 MG TB24 tablet Take 50 mg by mouth daily.       omeprazole (PRILOSEC) 20 MG capsule Take 20 mg by mouth at bedtime.       OVER THE COUNTER MEDICATION Apply 1 Application topically daily as needed (pain). CBD cream      Probiotic Product (PROBIOTIC PO) Take 1 capsule by mouth daily.      simvastatin (ZOCOR) 20 MG tablet Take 20 mg by mouth at bedtime.       traMADol (ULTRAM) 50 MG tablet Take 50 mg by mouth at bedtime as needed for moderate pain.   0    ALPRAZolam (XANAX) 0.25 MG tablet TAKE 1 TABLET BY MOUTH 3 TIMES DAILY AS NEEDED FOR ANXIETY. 90 tablet 0    DULoxetine (CYMBALTA) 30 MG capsule Take 1 capsule (30 mg total) by mouth 2 (two) times daily.  180 capsule 1    zaleplon (SONATA) 10 MG capsule TAKE 1 CAPSULE AT BEDTIME AS NEEDED (Patient not taking: Reported on 04/01/2022) 30 capsule 0 Not Taking   Allergies  Allergen Reactions   Hydroxyzine Other (See Comments)    Dizziness    Meloxicam Nausea And Vomiting        Oxycodone-Acetaminophen     Didn't work   Amoxicillin-Pot Clavulanate Diarrhea   Aripiprazole     ineffective, dizziness   Brexpiprazole     Ineffective   Bupropion Hcl Er (Xl)     ineffective   Clonazepam Other (See Comments)    dizziness   Darvon [Propoxyphene] Nausea And Vomiting   Escitalopram Oxalate     ineffective   Fluoxetine     ineffective after 10 yrs   Pollen Extract    Scopolamine Other (See Comments)    Psych. Disturbance ?   Sertraline Hcl     ineffective after 10 yrs   Viibryd [Vilazodone Hcl] Diarrhea    Social History   Tobacco Use   Smoking status: Never   Smokeless tobacco: Never  Substance Use Topics   Alcohol use: Yes    Comment: wine 2- 3 times per week, occas. cocktail    No family history on file.   Review of Systems  Constitutional:  Negative for chills and fever.  Respiratory:  Negative for cough and shortness of breath.   Cardiovascular:  Negative for chest pain.  Gastrointestinal:  Negative for nausea and vomiting.  Musculoskeletal:  Positive for arthralgias.     Objective:  Physical Exam Well nourished and well developed. General: Alert and oriented x3, cooperative and pleasant, no acute distress. Head: normocephalic, atraumatic, neck supple. Eyes: EOMI.  Musculoskeletal: Left hip exam: Painful and limited hip flexion internal and external rotation Active hip flexion with weakness due to pain Right hip range of motion is relatively pain-free with some tightness noted  Calves soft and nontender. Motor function intact in LE. Strength 5/5 LE bilaterally. Neuro: Distal pulses 2+. Sensation to light touch intact in LE.  Vital signs in last 24 hours:     Labs:   Estimated body mass index is 33.56 kg/m as calculated from the following:   Height as of 04/04/22: 5' 3.75" (1.619 m).   Weight as of 04/04/22: 88 kg.   Imaging Review Plain radiographs demonstrate severe degenerative joint disease of the left hip(s). The bone quality appears to be adequate for age and reported activity level.      Assessment/Plan:  End stage arthritis, left hip(s)  The patient history, physical  examination, clinical judgement of the provider and imaging studies are consistent with end stage degenerative joint disease of the left hip(s) and total hip arthroplasty is deemed medically necessary. The treatment options including medical management, injection therapy, arthroscopy and arthroplasty were discussed at length. The risks and benefits of total hip arthroplasty were presented and reviewed. The risks due to aseptic loosening, infection, stiffness, dislocation/subluxation,  thromboembolic complications and other imponderables were discussed.  The patient acknowledged the explanation, agreed to proceed with the plan and consent was signed. Patient is being admitted for inpatient treatment for surgery, pain control, PT, OT, prophylactic antibiotics, VTE prophylaxis, progressive ambulation and ADL's and discharge planning.The patient is planning to be discharged  home.  Therapy Plans: HEP Disposition: Home with daughter (staying x1 week) Planned DVT Prophylaxis: aspirin 69m BID DME needed: walker PCP: Dr. MLeonides Schanz clearance received TXA: IV Allergies: augmentin - diarrhea, darvon - vomiting, hydroxyzine - dizziness, meloxicam - vomiting, percocet - didn't work, scopolamine - psychological side effects Anesthesia Concerns: none BMI: 34.4 Last HgbA1c: 6.8% - on 10/31   Other: - Patient is retired RTherapist, sports- hydrocodone, robaxin, tylenol - wants foley catheter no matter what - Minimize IVF - No hx VTE or cancer  ACostella Hatcher PA-C Orthopedic  Surgery [facility] [place] ([phone]

## 2022-04-11 ENCOUNTER — Other Ambulatory Visit: Payer: Medicare Other

## 2022-04-11 ENCOUNTER — Ambulatory Visit: Payer: Medicare Other

## 2022-04-12 ENCOUNTER — Observation Stay (HOSPITAL_COMMUNITY): Payer: Medicare Other

## 2022-04-12 ENCOUNTER — Ambulatory Visit (HOSPITAL_COMMUNITY): Payer: Medicare Other | Admitting: Physician Assistant

## 2022-04-12 ENCOUNTER — Other Ambulatory Visit: Payer: Self-pay

## 2022-04-12 ENCOUNTER — Observation Stay (HOSPITAL_COMMUNITY)
Admission: RE | Admit: 2022-04-12 | Discharge: 2022-04-13 | Disposition: A | Discharge status: Home / Self Care | Payer: Medicare Other | Attending: Orthopedic Surgery | Admitting: Orthopedic Surgery

## 2022-04-12 ENCOUNTER — Encounter (HOSPITAL_COMMUNITY): Payer: Self-pay | Admitting: Orthopedic Surgery

## 2022-04-12 ENCOUNTER — Encounter (HOSPITAL_COMMUNITY)
Admission: RE | Disposition: A | Discharge status: Home / Self Care | Payer: Self-pay | Source: Home / Self Care | Attending: Orthopedic Surgery

## 2022-04-12 ENCOUNTER — Ambulatory Visit (HOSPITAL_COMMUNITY): Payer: Medicare Other

## 2022-04-12 ENCOUNTER — Ambulatory Visit (HOSPITAL_BASED_OUTPATIENT_CLINIC_OR_DEPARTMENT_OTHER): Payer: Medicare Other | Admitting: Anesthesiology

## 2022-04-12 DIAGNOSIS — Z79899 Other long term (current) drug therapy: Secondary | ICD-10-CM | POA: Diagnosis not present

## 2022-04-12 DIAGNOSIS — M1612 Unilateral primary osteoarthritis, left hip: Principal | ICD-10-CM | POA: Insufficient documentation

## 2022-04-12 DIAGNOSIS — Z96642 Presence of left artificial hip joint: Secondary | ICD-10-CM

## 2022-04-12 DIAGNOSIS — Z01818 Encounter for other preprocedural examination: Secondary | ICD-10-CM

## 2022-04-12 DIAGNOSIS — I10 Essential (primary) hypertension: Secondary | ICD-10-CM | POA: Insufficient documentation

## 2022-04-12 DIAGNOSIS — Z7982 Long term (current) use of aspirin: Secondary | ICD-10-CM | POA: Insufficient documentation

## 2022-04-12 DIAGNOSIS — E119 Type 2 diabetes mellitus without complications: Secondary | ICD-10-CM | POA: Diagnosis not present

## 2022-04-12 DIAGNOSIS — Z471 Aftercare following joint replacement surgery: Secondary | ICD-10-CM | POA: Diagnosis not present

## 2022-04-12 DIAGNOSIS — Z7984 Long term (current) use of oral hypoglycemic drugs: Secondary | ICD-10-CM | POA: Insufficient documentation

## 2022-04-12 DIAGNOSIS — Z96612 Presence of left artificial shoulder joint: Secondary | ICD-10-CM | POA: Insufficient documentation

## 2022-04-12 HISTORY — PX: TOTAL HIP ARTHROPLASTY: SHX124

## 2022-04-12 LAB — TYPE AND SCREEN
ABO/RH(D): B POS
Antibody Screen: NEGATIVE

## 2022-04-12 LAB — GLUCOSE, CAPILLARY
Glucose-Capillary: 186 mg/dL — ABNORMAL HIGH (ref 70–99)
Glucose-Capillary: 122 mg/dL — ABNORMAL HIGH (ref 70–99)
Glucose-Capillary: 336 mg/dL — ABNORMAL HIGH (ref 70–99)
Glucose-Capillary: 236 mg/dL — ABNORMAL HIGH (ref 70–99)

## 2022-04-12 SURGERY — ARTHROPLASTY, HIP, TOTAL, ANTERIOR APPROACH
Anesthesia: Spinal | Site: Hip | Laterality: Left

## 2022-04-12 MED ORDER — MENTHOL 3 MG MT LOZG: 1.0000 | LOZENGE | OROMUCOSAL | Status: DC | PRN

## 2022-04-12 MED ORDER — HYDROCODONE-ACETAMINOPHEN 7.5-325 MG PO TABS
1.0000 | ORAL_TABLET | ORAL | Status: DC | PRN
  Administered 2022-04-12 – 2022-04-13 (×3): 2 via ORAL
  Filled 2022-04-12 (×3): qty 2

## 2022-04-12 MED ORDER — ONDANSETRON HCL 4 MG/2ML IJ SOLN
INTRAMUSCULAR | Status: AC
  Filled 2022-04-12: qty 2

## 2022-04-12 MED ORDER — POVIDONE-IODINE 10 % EX SWAB
2.0000 | Freq: Once | CUTANEOUS | Status: AC
Start: 2022-04-12 — End: 2022-04-12
  Administered 2022-04-12: 2 via TOPICAL

## 2022-04-12 MED ORDER — FENTANYL CITRATE (PF) 100 MCG/2ML IJ SOLN
INTRAMUSCULAR | Status: AC
  Filled 2022-04-12: qty 2

## 2022-04-12 MED ORDER — STERILE WATER FOR IRRIGATION IR SOLN
Status: DC | PRN
  Administered 2022-04-12: 2000 mL

## 2022-04-12 MED ORDER — SIMVASTATIN 20 MG PO TABS
20.0000 mg | ORAL_TABLET | Freq: Every day | ORAL | Status: DC
  Administered 2022-04-12: 20 mg via ORAL
  Filled 2022-04-12: qty 1

## 2022-04-12 MED ORDER — DEXAMETHASONE SODIUM PHOSPHATE 10 MG/ML IJ SOLN
INTRAMUSCULAR | Status: AC
  Filled 2022-04-12: qty 1

## 2022-04-12 MED ORDER — METFORMIN HCL ER 500 MG PO TB24
500.0000 mg | ORAL_TABLET | Freq: Two times a day (BID) | ORAL | Status: DC
  Administered 2022-04-13: 500 mg via ORAL
  Filled 2022-04-12: qty 1

## 2022-04-12 MED ORDER — MELATONIN 5 MG PO TABS
10.0000 mg | ORAL_TABLET | Freq: Every day | ORAL | Status: DC
  Administered 2022-04-12: 10 mg via ORAL
  Filled 2022-04-12: qty 2

## 2022-04-12 MED ORDER — FENTANYL CITRATE (PF) 100 MCG/2ML IJ SOLN
INTRAMUSCULAR | Status: DC | PRN
  Administered 2022-04-12: 25 ug via INTRAVENOUS

## 2022-04-12 MED ORDER — DOCUSATE SODIUM 100 MG PO CAPS
100.0000 mg | ORAL_CAPSULE | Freq: Two times a day (BID) | ORAL | Status: DC
  Filled 2022-04-12 (×2): qty 1

## 2022-04-12 MED ORDER — PROPOFOL 1000 MG/100ML IV EMUL
INTRAVENOUS | Status: AC
  Filled 2022-04-12: qty 100

## 2022-04-12 MED ORDER — ALPRAZOLAM 0.25 MG PO TABS
0.2500 mg | ORAL_TABLET | Freq: Three times a day (TID) | ORAL | Status: DC | PRN
  Administered 2022-04-12: 0.25 mg via ORAL
  Filled 2022-04-12: qty 1

## 2022-04-12 MED ORDER — METOPROLOL SUCCINATE ER 25 MG PO TB24
25.0000 mg | ORAL_TABLET | Freq: Every day | ORAL | Status: DC
  Administered 2022-04-13: 25 mg via ORAL
  Filled 2022-04-12: qty 1

## 2022-04-12 MED ORDER — ONDANSETRON HCL 4 MG/2ML IJ SOLN: 4.0000 mg | Freq: Once | INTRAMUSCULAR | Status: DC | PRN

## 2022-04-12 MED ORDER — BUPIVACAINE-EPINEPHRINE (PF) 0.5% -1:200000 IJ SOLN
INTRAMUSCULAR | Status: AC
  Filled 2022-04-12: qty 30

## 2022-04-12 MED ORDER — ORAL CARE MOUTH RINSE: 15.0000 mL | Freq: Once | OROMUCOSAL | Status: AC

## 2022-04-12 MED ORDER — CEFAZOLIN SODIUM-DEXTROSE 2-4 GM/100ML-% IV SOLN
2.0000 g | Freq: Four times a day (QID) | INTRAVENOUS | Status: AC
  Administered 2022-04-12 (×2): 2 g via INTRAVENOUS
  Filled 2022-04-12 (×2): qty 100

## 2022-04-12 MED ORDER — ASPIRIN 81 MG PO CHEW
81.0000 mg | CHEWABLE_TABLET | Freq: Two times a day (BID) | ORAL | Status: DC
  Administered 2022-04-12 – 2022-04-13 (×2): 81 mg via ORAL
  Filled 2022-04-12 (×2): qty 1

## 2022-04-12 MED ORDER — DEXAMETHASONE SODIUM PHOSPHATE 10 MG/ML IJ SOLN
10.0000 mg | Freq: Once | INTRAMUSCULAR | Status: AC
  Administered 2022-04-13: 10 mg via INTRAVENOUS
  Filled 2022-04-12: qty 1

## 2022-04-12 MED ORDER — PROPOFOL 10 MG/ML IV BOLUS
INTRAVENOUS | Status: DC | PRN
  Administered 2022-04-12: 30 mg via INTRAVENOUS
  Administered 2022-04-12: 55 ug/kg/min via INTRAVENOUS

## 2022-04-12 MED ORDER — MORPHINE SULFATE (PF) 2 MG/ML IV SOLN: 0.5000 mg | INTRAVENOUS | Status: DC | PRN

## 2022-04-12 MED ORDER — METHOCARBAMOL 500 MG IVPB - SIMPLE MED: 500.0000 mg | Freq: Four times a day (QID) | INTRAVENOUS | Status: DC | PRN

## 2022-04-12 MED ORDER — KETOROLAC TROMETHAMINE 30 MG/ML IJ SOLN
INTRAMUSCULAR | Status: AC
  Filled 2022-04-12: qty 1

## 2022-04-12 MED ORDER — BUPIVACAINE IN DEXTROSE 0.75-8.25 % IT SOLN
INTRATHECAL | Status: DC | PRN
  Administered 2022-04-12: 1.6 mL via INTRATHECAL

## 2022-04-12 MED ORDER — CHLORHEXIDINE GLUCONATE 0.12 % MT SOLN
15.0000 mL | Freq: Once | OROMUCOSAL | Status: AC
  Administered 2022-04-12: 15 mL via OROMUCOSAL

## 2022-04-12 MED ORDER — LOSARTAN POTASSIUM 50 MG PO TABS: 100.0000 mg | ORAL_TABLET | Freq: Every day | ORAL | Status: DC

## 2022-04-12 MED ORDER — SODIUM CHLORIDE 0.9 % IV SOLN: INTRAVENOUS | Status: DC

## 2022-04-12 MED ORDER — ONDANSETRON HCL 4 MG/2ML IJ SOLN
INTRAMUSCULAR | Status: DC | PRN
  Administered 2022-04-12: 4 mg via INTRAVENOUS

## 2022-04-12 MED ORDER — BUPIVACAINE-EPINEPHRINE (PF) 0.5% -1:200000 IJ SOLN
INTRAMUSCULAR | Status: DC | PRN
  Administered 2022-04-12: 30 mL

## 2022-04-12 MED ORDER — METOCLOPRAMIDE HCL 5 MG PO TABS: 5.0000 mg | ORAL_TABLET | Freq: Three times a day (TID) | ORAL | Status: DC | PRN

## 2022-04-12 MED ORDER — LACTATED RINGERS IV SOLN: INTRAVENOUS | Status: DC

## 2022-04-12 MED ORDER — KETOROLAC TROMETHAMINE 30 MG/ML IJ SOLN
INTRAMUSCULAR | Status: DC | PRN
  Administered 2022-04-12: 30 mg via INTRAMUSCULAR

## 2022-04-12 MED ORDER — HYDROCODONE-ACETAMINOPHEN 5-325 MG PO TABS
1.0000 | ORAL_TABLET | ORAL | Status: DC | PRN
  Administered 2022-04-12: 2 via ORAL
  Administered 2022-04-13: 1 via ORAL
  Filled 2022-04-12: qty 2
  Filled 2022-04-12: qty 1

## 2022-04-12 MED ORDER — TRANEXAMIC ACID-NACL 1000-0.7 MG/100ML-% IV SOLN
1000.0000 mg | INTRAVENOUS | Status: AC
  Administered 2022-04-12: 1000 mg via INTRAVENOUS
  Filled 2022-04-12: qty 100

## 2022-04-12 MED ORDER — ONDANSETRON HCL 4 MG PO TABS: 4.0000 mg | ORAL_TABLET | Freq: Four times a day (QID) | ORAL | Status: DC | PRN

## 2022-04-12 MED ORDER — METHOCARBAMOL 500 MG PO TABS
500.0000 mg | ORAL_TABLET | Freq: Four times a day (QID) | ORAL | Status: DC | PRN
  Administered 2022-04-12 – 2022-04-13 (×3): 500 mg via ORAL
  Filled 2022-04-12 (×3): qty 1

## 2022-04-12 MED ORDER — DIPHENHYDRAMINE HCL 12.5 MG/5ML PO ELIX: 12.5000 mg | ORAL_SOLUTION | ORAL | Status: DC | PRN

## 2022-04-12 MED ORDER — BISACODYL 10 MG RE SUPP: 10.0000 mg | Freq: Every day | RECTAL | Status: DC | PRN

## 2022-04-12 MED ORDER — SODIUM CHLORIDE (PF) 0.9 % IJ SOLN
INTRAMUSCULAR | Status: DC | PRN
  Administered 2022-04-12: 30 mL

## 2022-04-12 MED ORDER — ACETAMINOPHEN 325 MG PO TABS: 325.0000 mg | ORAL_TABLET | Freq: Four times a day (QID) | ORAL | Status: DC | PRN

## 2022-04-12 MED ORDER — MIRABEGRON ER 25 MG PO TB24
50.0000 mg | ORAL_TABLET | Freq: Every day | ORAL | Status: DC
  Administered 2022-04-13: 50 mg via ORAL
  Filled 2022-04-12: qty 2

## 2022-04-12 MED ORDER — PANTOPRAZOLE SODIUM 40 MG PO TBEC
40.0000 mg | DELAYED_RELEASE_TABLET | Freq: Every day | ORAL | Status: DC
  Administered 2022-04-12 – 2022-04-13 (×2): 40 mg via ORAL
  Filled 2022-04-12 (×2): qty 1

## 2022-04-12 MED ORDER — DULOXETINE HCL 30 MG PO CPEP
30.0000 mg | ORAL_CAPSULE | Freq: Two times a day (BID) | ORAL | Status: DC
  Administered 2022-04-13: 30 mg via ORAL
  Filled 2022-04-12 (×2): qty 1

## 2022-04-12 MED ORDER — PHENOL 1.4 % MT LIQD: 1.0000 | OROMUCOSAL | Status: DC | PRN

## 2022-04-12 MED ORDER — INSULIN ASPART 100 UNIT/ML IJ SOLN
0.0000 [IU] | Freq: Three times a day (TID) | INTRAMUSCULAR | Status: DC
  Administered 2022-04-12: 11 [IU] via SUBCUTANEOUS
  Administered 2022-04-13: 5 [IU] via SUBCUTANEOUS
  Administered 2022-04-13: 2 [IU] via SUBCUTANEOUS

## 2022-04-12 MED ORDER — MIDAZOLAM HCL 2 MG/2ML IJ SOLN
INTRAMUSCULAR | Status: AC
  Filled 2022-04-12: qty 2

## 2022-04-12 MED ORDER — METOCLOPRAMIDE HCL 5 MG/ML IJ SOLN: 5.0000 mg | Freq: Three times a day (TID) | INTRAMUSCULAR | Status: DC | PRN

## 2022-04-12 MED ORDER — SODIUM CHLORIDE (PF) 0.9 % IJ SOLN
INTRAMUSCULAR | Status: AC
  Filled 2022-04-12: qty 50

## 2022-04-12 MED ORDER — MIDAZOLAM HCL 5 MG/5ML IJ SOLN
INTRAMUSCULAR | Status: DC | PRN
  Administered 2022-04-12: 2 mg via INTRAVENOUS

## 2022-04-12 MED ORDER — DEXAMETHASONE SODIUM PHOSPHATE 10 MG/ML IJ SOLN
8.0000 mg | Freq: Once | INTRAMUSCULAR | Status: AC
  Administered 2022-04-12: 8 mg via INTRAVENOUS

## 2022-04-12 MED ORDER — BUPIVACAINE-EPINEPHRINE (PF) 0.25% -1:200000 IJ SOLN: INTRAMUSCULAR | Status: DC | PRN

## 2022-04-12 MED ORDER — FENTANYL CITRATE PF 50 MCG/ML IJ SOSY: 25.0000 ug | PREFILLED_SYRINGE | INTRAMUSCULAR | Status: DC | PRN

## 2022-04-12 MED ORDER — ACETAMINOPHEN 500 MG PO TABS
1000.0000 mg | ORAL_TABLET | Freq: Once | ORAL | Status: AC
  Administered 2022-04-12: 1000 mg via ORAL
  Filled 2022-04-12: qty 2

## 2022-04-12 MED ORDER — ONDANSETRON HCL 4 MG/2ML IJ SOLN: 4.0000 mg | Freq: Four times a day (QID) | INTRAMUSCULAR | Status: DC | PRN

## 2022-04-12 MED ORDER — CEFAZOLIN SODIUM-DEXTROSE 2-4 GM/100ML-% IV SOLN
2.0000 g | INTRAVENOUS | Status: AC
  Administered 2022-04-12: 2 g via INTRAVENOUS
  Filled 2022-04-12: qty 100

## 2022-04-12 MED ORDER — TRANEXAMIC ACID-NACL 1000-0.7 MG/100ML-% IV SOLN
1000.0000 mg | Freq: Once | INTRAVENOUS | Status: AC
  Administered 2022-04-12: 1000 mg via INTRAVENOUS
  Filled 2022-04-12: qty 100

## 2022-04-12 MED ORDER — POLYETHYLENE GLYCOL 3350 17 G PO PACK
17.0000 g | PACK | Freq: Two times a day (BID) | ORAL | Status: DC
  Filled 2022-04-12 (×2): qty 1

## 2022-04-12 MED ORDER — 0.9 % SODIUM CHLORIDE (POUR BTL) OPTIME
TOPICAL | Status: DC | PRN
  Administered 2022-04-12: 1000 mL

## 2022-04-12 MED ORDER — PHENYLEPHRINE HCL-NACL 20-0.9 MG/250ML-% IV SOLN
INTRAVENOUS | Status: DC | PRN
  Administered 2022-04-12: 20 ug/min via INTRAVENOUS

## 2022-04-12 SURGICAL SUPPLY — 34 items
BAG COUNTER SPONGE SURGICOUNT (BAG) IMPLANT
BLADE SAG 18X100X1.27 (BLADE) ×1 IMPLANT
COVER PERINEAL POST (MISCELLANEOUS) ×1 IMPLANT
COVER SURGICAL LIGHT HANDLE (MISCELLANEOUS) ×1 IMPLANT
CUP ACETBLR 52 OD PINNACLE (Hips) IMPLANT
DERMABOND ADVANCED .7 DNX12 (GAUZE/BANDAGES/DRESSINGS) ×1 IMPLANT
DRAPE FOOT SWITCH (DRAPES) ×1 IMPLANT
DRAPE STERI IOBAN 125X83 (DRAPES) ×1 IMPLANT
DRAPE U-SHAPE 47X51 STRL (DRAPES) ×2 IMPLANT
DRSG AQUACEL AG ADV 3.5X10 (GAUZE/BANDAGES/DRESSINGS) IMPLANT
DURAPREP 26ML APPLICATOR (WOUND CARE) ×1 IMPLANT
ELECT REM PT RETURN 15FT ADLT (MISCELLANEOUS) ×1 IMPLANT
GLOVE BIO SURGEON STRL SZ 6 (GLOVE) ×1 IMPLANT
GLOVE BIOGEL PI IND STRL 6.5 (GLOVE) ×1 IMPLANT
GLOVE BIOGEL PI IND STRL 7.5 (GLOVE) ×1 IMPLANT
GLOVE ORTHO TXT STRL SZ7.5 (GLOVE) ×2 IMPLANT
GOWN STRL REUS W/ TWL LRG LVL3 (GOWN DISPOSABLE) ×2 IMPLANT
GOWN STRL REUS W/TWL LRG LVL3 (GOWN DISPOSABLE) ×2
HEAD CERAMIC 36 PLUS5 (Hips) IMPLANT
HOLDER FOLEY CATH W/STRAP (MISCELLANEOUS) ×1 IMPLANT
KIT TURNOVER KIT A (KITS) IMPLANT
LINER NEUTRAL 52X36MM PLUS 4 (Liner) IMPLANT
PACK ANTERIOR HIP CUSTOM (KITS) ×1 IMPLANT
SCREW 6.5MMX30MM (Screw) IMPLANT
STEM FEM ACTIS HIGH SZ3 (Stem) IMPLANT
SUT MNCRL AB 4-0 PS2 18 (SUTURE) ×1 IMPLANT
SUT STRATAFIX 0 PDS 27 VIOLET (SUTURE) ×1
SUT VIC AB 1 CT1 36 (SUTURE) ×3 IMPLANT
SUT VIC AB 2-0 CT1 27 (SUTURE) ×2
SUT VIC AB 2-0 CT1 TAPERPNT 27 (SUTURE) ×2 IMPLANT
SUTURE STRATFX 0 PDS 27 VIOLET (SUTURE) ×1 IMPLANT
TRAY FOLEY MTR SLVR 14FR STAT (SET/KITS/TRAYS/PACK) IMPLANT
TUBE SUCTION HIGH CAP CLEAR NV (SUCTIONS) ×1 IMPLANT
WATER STERILE IRR 1000ML POUR (IV SOLUTION) ×1 IMPLANT

## 2022-04-12 NOTE — Discharge Instructions (Signed)

## 2022-04-12 NOTE — Anesthesia Procedure Notes (Signed)
Spinal  Patient location during procedure: OR Start time: 04/12/2022 9:30 AM End time: 04/12/2022 9:38 AM Reason for block: surgical anesthesia Staffing Performed: anesthesiologist  Anesthesiologist: Santa Lighter, MD Performed by: Santa Lighter, MD Authorized by: Santa Lighter, MD   Preanesthetic Checklist Completed: patient identified, IV checked, risks and benefits discussed, surgical consent, monitors and equipment checked, pre-op evaluation and timeout performed Spinal Block Patient position: sitting Prep: DuraPrep and site prepped and draped Patient monitoring: continuous pulse ox and blood pressure Approach: midline Location: L3-4 Injection technique: single-shot Needle Needle type: Pencan  Needle gauge: 24 G Assessment Events: CSF return and second provider Additional Notes Functioning IV was confirmed and monitors were applied. Sterile prep and drape, including hand hygiene, mask and sterile gloves were used. The patient was positioned and the spine was prepped. The skin was anesthetized with lidocaine.  Free flow of clear CSF was obtained prior to injecting local anesthetic into the CSF.  The spinal needle aspirated freely following injection.  The needle was carefully withdrawn.  The patient tolerated the procedure well. Consent was obtained prior to procedure with all questions answered and concerns addressed. Risks including but not limited to bleeding, infection, nerve damage, paralysis, failed block, inadequate analgesia, allergic reaction, high spinal, itching and headache were discussed and the patient wished to proceed.   Hoy Morn, MD

## 2022-04-12 NOTE — Anesthesia Preprocedure Evaluation (Addendum)
Anesthesia Evaluation  Patient identified by MRN, date of birth, ID band Patient awake    Reviewed: Allergy & Precautions, NPO status , Patient's Chart, lab work & pertinent test results, reviewed documented beta blocker date and time   Airway Mallampati: II  TM Distance: >3 FB Neck ROM: Full    Dental  (+) Teeth Intact, Dental Advisory Given Upper, permanent bridge :   Pulmonary sleep apnea and Continuous Positive Airway Pressure Ventilation    Pulmonary exam normal breath sounds clear to auscultation       Cardiovascular hypertension, Pt. on medications and Pt. on home beta blockers + Peripheral Vascular Disease  Normal cardiovascular exam Rhythm:Regular Rate:Normal     Neuro/Psych  PSYCHIATRIC DISORDERS Anxiety Depression    negative neurological ROS     GI/Hepatic Neg liver ROS,GERD  Medicated,,  Endo/Other  diabetes, Type 2, Oral Hypoglycemic Agents    Renal/GU negative Renal ROS     Musculoskeletal  (+) Arthritis  (Left hip AVN),    Abdominal   Peds  Hematology negative hematology ROS (+) Plt 267k   Anesthesia Other Findings Day of surgery medications reviewed with the patient.  Reproductive/Obstetrics                             Anesthesia Physical Anesthesia Plan  ASA: 2  Anesthesia Plan: Spinal   Post-op Pain Management: Tylenol PO (pre-op)*   Induction: Intravenous  PONV Risk Score and Plan: 2 and TIVA, Dexamethasone and Ondansetron  Airway Management Planned: Natural Airway and Simple Face Mask  Additional Equipment:   Intra-op Plan:   Post-operative Plan:   Informed Consent: I have reviewed the patients History and Physical, chart, labs and discussed the procedure including the risks, benefits and alternatives for the proposed anesthesia with the patient or authorized representative who has indicated his/her understanding and acceptance.     Dental advisory  given  Plan Discussed with: CRNA, Anesthesiologist and Surgeon  Anesthesia Plan Comments: (Discussed risks and benefits of and differences between spinal and general. Discussed risks of spinal including headache, backache, failure, bleeding, infection, and nerve damage. Patient consents to spinal. Questions answered. Coagulation studies and platelet count acceptable.)        Anesthesia Quick Evaluation

## 2022-04-12 NOTE — Transfer of Care (Signed)
Immediate Anesthesia Transfer of Care Note  Patient: Stacey Santana  Procedure(s) Performed: TOTAL HIP ARTHROPLASTY ANTERIOR APPROACH (Left: Hip)  Patient Location: PACU  Anesthesia Type:MAC and Spinal  Level of Consciousness: awake, alert , oriented, and patient cooperative  Airway & Oxygen Therapy: Patient Spontanous Breathing and Patient connected to face mask oxygen  Post-op Assessment: Report given to RN and Post -op Vital signs reviewed and stable  Post vital signs: Reviewed and stable  Last Vitals:  Vitals Value Taken Time  BP 114/58 04/12/22 1115  Temp    Pulse 72 04/12/22 1118  Resp 18 04/12/22 1118  SpO2 100 % 04/12/22 1118  Vitals shown include unvalidated device data.  Last Pain:  Vitals:   04/12/22 0812  TempSrc:   PainSc: 0-No pain         Complications: No notable events documented.

## 2022-04-12 NOTE — Evaluation (Signed)
Physical Therapy Evaluation Patient Details Name: Stacey Santana MRN: [id number] DOB: Oct 06, 1945 Today's Date: 04/12/2022  History of Present Illness  Pt is a 76yo female presenting s/p L-THA, AA on 04/12/22. PMH: DM, GERD, HTN, PVD, OSA on CPAP, L-TSA 2019, depression.   Clinical Impression  Stacey Santana is a 76 y.o. female POD 0 s/p L-THA, AA. Patient reports modified independence using SPC with mobility at baseline. Patient is now limited by functional impairments (see PT problem list below) and requires min assist for bed mobility and min guard for transfers. Patient was able to ambulate 32 feet with RW and min guard level of assist. Patient instructed in exercise to facilitate ROM and circulation to manage edema. Provided incentive spirometer and with Vcs pt able to achieve 1234m. Patient will benefit from continued skilled PT interventions to address impairments and progress towards PLOF. Acute PT will follow to progress mobility and stair training in preparation for safe discharge home.      Recommendations for follow up therapy are one component of a multi-disciplinary discharge planning process, led by the attending physician.  Recommendations may be updated based on patient status, additional functional criteria and insurance authorization.  Follow Up Recommendations Follow physician's recommendations for discharge plan and follow up therapies      Assistance Recommended at Discharge Frequent or constant Supervision/Assistance  Patient can return home with the following  A little help with walking and/or transfers;A little help with bathing/dressing/bathroom;Assistance with cooking/housework;Help with stairs or ramp for entrance;Assist for transportation    Equipment Recommendations Rolling walker (2 wheels)  Recommendations for Other Services       Functional Status Assessment Patient has had a recent decline in their functional status and demonstrates the ability to make  significant improvements in function in a reasonable and predictable amount of time.     Precautions / Restrictions Precautions Precautions: Fall Restrictions Weight Bearing Restrictions: No Other Position/Activity Restrictions: wbat      Mobility  Bed Mobility Overal bed mobility: Needs Assistance Bed Mobility: Supine to Sit     Supine to sit: Min assist     General bed mobility comments: Min A to bring LLE off bed, otherwise min guard    Transfers Overall transfer level: Needs assistance Equipment used: Rolling walker (2 wheels) Transfers: Sit to/from Stand Sit to Stand: Min guard           General transfer comment: For safety only, VCs for sequencing    Ambulation/Gait Ambulation/Gait assistance: Min guard Gait Distance (Feet): 32 Feet Assistive device: Rolling walker (2 wheels) Gait Pattern/deviations: Step-to pattern Gait velocity: decreased     General Gait Details: Pt ambulated with RW and min guard, no physical assist required or overt LOB noted.  Stairs            Wheelchair Mobility    Modified Rankin (Stroke Patients Only)       Balance Overall balance assessment: Needs assistance Sitting-balance support: Feet supported, No upper extremity supported Sitting balance-Leahy Scale: Good     Standing balance support: Reliant on assistive device for balance, During functional activity, Bilateral upper extremity supported Standing balance-Leahy Scale: Poor                               Pertinent Vitals/Pain Pain Assessment Pain Assessment: 0-10 Pain Score: 5  Pain Location: left hip Pain Descriptors / Indicators: Operative site guarding Pain Intervention(s): Repositioned, Monitored during session, Limited activity within patient's  tolerance, Ice applied    Home Living Family/patient expects to be discharged to:: Private residence Living Arrangements: Alone Available Help at Discharge: Family;Available 24  hours/day;Neighbor Type of Home: House Home Access: Stairs to enter Entrance Stairs-Rails: Right (left railing on second step, otherwise no railing) Entrance Stairs-Number of Steps: 1+1+2 Alternate Level Stairs-Number of Steps: 8, landing, 8 Home Layout: Two level;Able to live on main level with bedroom/bathroom Home Equipment: Cane - single point;Rollator (4 wheels);Hospital bed;Toilet riser;BSC/3in1      Prior Function Prior Level of Function : Independent/Modified Independent             Mobility Comments: SPC ADLs Comments: IND, sponge bathing the previous week prior to surgery     Hand Dominance   Dominant Hand: Right    Extremity/Trunk Assessment   Upper Extremity Assessment Upper Extremity Assessment: Overall WFL for tasks assessed    Lower Extremity Assessment Lower Extremity Assessment: RLE deficits/detail;LLE deficits/detail RLE Deficits / Details: MMT ank DF/PF 5/5 RLE Sensation: WNL LLE Deficits / Details: MMT ank DF/PF 5/5 LLE Sensation: WNL    Cervical / Trunk Assessment Cervical / Trunk Assessment: Kyphotic  Communication   Communication: No difficulties  Cognition Arousal/Alertness: Awake/alert Behavior During Therapy: WFL for tasks assessed/performed Overall Cognitive Status: Within Functional Limits for tasks assessed                                          General Comments General comments (skin integrity, edema, etc.): daughter Larene Beach present    Exercises Total Joint Exercises Ankle Circles/Pumps: AROM, Both, 20 reps   Assessment/Plan    PT Assessment Patient needs continued PT services  PT Problem List Decreased strength;Decreased range of motion;Decreased activity tolerance;Decreased balance;Decreased mobility;Decreased coordination;Pain       PT Treatment Interventions DME instruction;Gait training;Stair training;Functional mobility training;Therapeutic activities;Therapeutic exercise;Balance  training;Neuromuscular re-education;Patient/family education    PT Goals (Current goals can be found in the Care Plan section)  Acute Rehab PT Goals Patient Stated Goal: Walk around the block PT Goal Formulation: With patient Time For Goal Achievement: 04/19/22 Potential to Achieve Goals: Good    Frequency 7X/week     Co-evaluation               AM-PAC PT "6 Clicks" Mobility  Outcome Measure Help needed turning from your back to your side while in a flat bed without using bedrails?: None Help needed moving from lying on your back to sitting on the side of a flat bed without using bedrails?: A Little Help needed moving to and from a bed to a chair (including a wheelchair)?: A Little Help needed standing up from a chair using your arms (e.g., wheelchair or bedside chair)?: A Little Help needed to walk in hospital room?: A Little Help needed climbing 3-5 steps with a railing? : A Little 6 Click Score: 19    End of Session Equipment Utilized During Treatment: Gait belt Activity Tolerance: Patient tolerated treatment well;No increased pain Patient left: in chair;with call bell/phone within reach;with chair alarm set;with family/visitor present;with SCD's reapplied Nurse Communication: Mobility status PT Visit Diagnosis: Pain;Difficulty in walking, not elsewhere classified (R26.2) Pain - Right/Left: Left Pain - part of body: Hip    Time: 7858-8502 PT Time Calculation (min) (ACUTE ONLY): 28 min   Charges:   PT Evaluation $PT Eval Low Complexity: 1 Low PT Treatments $Gait Training: 8-22 mins  Coolidge Breeze, PT, DPT [facility] Office: [phone] Weekend pager: [phone]  Coolidge Breeze 04/12/2022, 4:11 PM

## 2022-04-12 NOTE — Op Note (Signed)
NAME:  Stacey Santana                ACCOUNT NO.: [id number]      MEDICAL RECORD NO.: [id number]      FACILITY:  [facility]      PHYSICIAN:  Mauri Pole  DATE OF BIRTH:  Oct 04, 1945     DATE OF PROCEDURE:  04/12/2022                                 OPERATIVE REPORT         PREOPERATIVE DIAGNOSIS: Left  hip osteoarthritis.      POSTOPERATIVE DIAGNOSIS:  Left hip osteoarthritis.      PROCEDURE:  Left total hip replacement through an anterior approach   utilizing DePuy THR system, component size 65m pinnacle cup, a size 36+4 neutral   Altrex liner, a size 3 Hi Actis stem with a 36+5 delta ceramic   ball.      SURGEON:  MPietro Cassis OAlvan Dame M.D.      ASSISTANT:  ACostella Hatcher PA-C     ANESTHESIA:  Spinal.      SPECIMENS:  None.      COMPLICATIONS:  None.      BLOOD LOSS:  400 cc     DRAINS:  None.      INDICATION OF THE PROCEDURE:  [name] a 76y.o. female who had   presented to office for evaluation of left hip pain.  Radiographs revealed   progressive degenerative changes with bone-on-bone   articulation of the  hip joint, including subchondral cystic changes and osteophytes.  The patient had painful limited range of   motion significantly affecting their overall quality of life and function.  The patient was failing to    respond to conservative measures including medications and/or injections and activity modification and at this point was ready   to proceed with more definitive measures.  Consent was obtained for   benefit of pain relief.  Specific risks of infection, DVT, component   failure, dislocation, neurovascular injury, and need for revision surgery were reviewed in the office.     PROCEDURE IN DETAIL:  The patient was brought to operative theater.   Once adequate anesthesia, preoperative antibiotics, 2 gm of Ancef, 1 gm of Tranexamic Acid, and 10 mg of Decadron were administered, the patient was positioned supine on the [employer] table.  Once the patient was safely positioned with adequate padding of boney prominences we predraped out the hip, and used fluoroscopy to confirm orientation of the pelvis.      The left hip was then prepped and draped from proximal iliac crest to   mid thigh with a shower curtain technique.      Time-out was performed identifying the patient, planned procedure, and the appropriate extremity.     An incision was then made 2 cm lateral to the   anterior superior iliac spine extending over the orientation of the   tensor fascia lata muscle and sharp dissection was carried down to the   fascia of the muscle.      The fascia was then incised.  The muscle belly was identified and swept   laterally and retractor placed along the superior neck.  Following   cauterization of the circumflex vessels and removing some pericapsular   fat, a second cobra retractor was placed on the inferior neck.  A  T-capsulotomy was made along the line of the   superior neck to the trochanteric fossa, then extended proximally and   distally.  Tag sutures were placed and the retractors were then placed   intracapsular.  We then identified the trochanteric fossa and   orientation of my neck cut and then made a neck osteotomy with the femur on traction.  The femoral   head was removed without difficulty or complication.  Traction was let   off and retractors were placed posterior and anterior around the   acetabulum.      The labrum and foveal tissue were debrided.  I began reaming with a 45 mm   reamer and reamed up to 51 mm reamer with good bony bed preparation and a 52 mm  cup was chosen.  The final 52 mm Pinnacle cup was then impacted under fluoroscopy to confirm the depth of penetration and orientation with respect to   Abduction and forward flexion.  A screw was placed into the ilium followed by the hole eliminator.  The final   36+4 neutral Altrex liner was impacted with good visualized rim fit.  The cup was  positioned anatomically within the acetabular portion of the pelvis.      At this point, the femur was rolled to 100 degrees.  Further capsule was   released off the inferior aspect of the femoral neck.  I then   released the superior capsule proximally.  With the leg in a neutral position the hook was placed laterally   along the femur under the vastus lateralis origin and elevated manually and then held in position using the hook attachment on the bed.  The leg was then extended and adducted with the leg rolled to 100   degrees of external rotation.  Retractors were placed along the medial calcar and posteriorly over the greater trochanter.  Once the proximal femur was fully   exposed, I used a box osteotome to set orientation.  I then began   broaching with the starting chili pepper broach and passed this by hand and then broached up to 3.  With the 3 broach in place I chose a high offset neck and did several trial reductions.  The offset was appropriate, leg lengths   appeared to be closest best matched with the +5 head ball trial confirmed radiographically.  The +5 provided the best in stability though probably at least a few mm of length but stability very important to me.  Given these findings, I went ahead and dislocated the hip, repositioned all   retractors and positioned the right hip in the extended and abducted position.  The final 3Hi Actis stem was   chosen and it was impacted down to the level of neck cut.  Based on this   and the trial reductions, a final 36+5 delta ceramic ball was chosen and   impacted onto a clean and dry trunnion, and the hip was reduced.  The   hip had been irrigated throughout the case again at this point.  I did   reapproximate the superior capsular leaflet to the anterior leaflet   using #1 Vicryl.  The fascia of the   tensor fascia lata muscle was then reapproximated using #1 Vicryl and #0 Stratafix sutures.  The   remaining wound was closed with 2-0  Vicryl and running 4-0 Monocryl.   The hip was cleaned, dried, and dressed sterilely using Dermabond and   Aquacel dressing.  The patient was  then brought   to recovery room in stable condition tolerating the procedure well.    Costella Hatcher, PA-C was present for the entirety of the case involved from   preoperative positioning, perioperative retractor management, general   facilitation of the case, as well as primary wound closure as assistant.            Pietro Cassis Alvan Dame, M.D.        04/12/2022 9:36 AM

## 2022-04-12 NOTE — Interval H&P Note (Signed)
History and Physical Interval Note:  04/12/2022 8:22 AM  Stacey Santana  has presented today for surgery, with the diagnosis of Left hip avascular necrosis.  The various methods of treatment have been discussed with the patient and family. After consideration of risks, benefits and other options for treatment, the patient has consented to  Procedure(s): TOTAL HIP ARTHROPLASTY ANTERIOR APPROACH (Left) as a surgical intervention.  The patient's history has been reviewed, patient examined, no change in status, stable for surgery.  I have reviewed the patient's chart and labs.  Questions were answered to the patient's satisfaction.     Mauri Pole

## 2022-04-12 NOTE — Anesthesia Postprocedure Evaluation (Signed)
Anesthesia Post Note  Patient: Stacey Santana  Procedure(s) Performed: TOTAL HIP ARTHROPLASTY ANTERIOR APPROACH (Left: Hip)     Patient location during evaluation: PACU Anesthesia Type: Spinal Level of consciousness: awake, awake and alert and oriented Pain management: pain level controlled Vital Signs Assessment: post-procedure vital signs reviewed and stable Respiratory status: spontaneous breathing, nonlabored ventilation and respiratory function stable Cardiovascular status: blood pressure returned to baseline and stable Postop Assessment: no headache, no backache, spinal receding and no apparent nausea or vomiting Anesthetic complications: no   No notable events documented.  Last Vitals:  Vitals:   04/12/22 1422 04/12/22 1621  BP: 125/72 136/67  Pulse: 73 96  Resp: 20 18  Temp: 36.6 C 36.8 C  SpO2: 99% 94%    Last Pain:  Vitals:   04/12/22 1642  TempSrc:   PainSc: [name]

## 2022-04-12 NOTE — Care Plan (Signed)
Ortho Bundle Case Management Note  Patient Details  Name: Stacey Santana MRN: [id number] Date of Birth: 1945-11-01                  L THA on 04/12/22.  DCP: Home with daughter.  DME: RW ordered through [place].  PT: HEP   DME Arranged:  Walker rolling DME Agency:  [employer]    Additional Comments: Please contact me with any questions of if this plan should need to change.  Dario Ave, Case Manager  [facility] [phone] 04/12/2022, 11:10 AM

## 2022-04-13 DIAGNOSIS — E119 Type 2 diabetes mellitus without complications: Secondary | ICD-10-CM | POA: Diagnosis not present

## 2022-04-13 DIAGNOSIS — Z79899 Other long term (current) drug therapy: Secondary | ICD-10-CM | POA: Diagnosis not present

## 2022-04-13 DIAGNOSIS — Z96612 Presence of left artificial shoulder joint: Secondary | ICD-10-CM | POA: Diagnosis not present

## 2022-04-13 DIAGNOSIS — I10 Essential (primary) hypertension: Secondary | ICD-10-CM | POA: Diagnosis not present

## 2022-04-13 DIAGNOSIS — Z7982 Long term (current) use of aspirin: Secondary | ICD-10-CM | POA: Diagnosis not present

## 2022-04-13 DIAGNOSIS — M1612 Unilateral primary osteoarthritis, left hip: Secondary | ICD-10-CM | POA: Diagnosis not present

## 2022-04-13 LAB — CBC
HCT: 32.8 % — ABNORMAL LOW (ref 36.0–46.0)
Hemoglobin: 10.5 g/dL — ABNORMAL LOW (ref 12.0–15.0)
MCH: 29.8 pg (ref 26.0–34.0)
MCHC: 32 g/dL (ref 30.0–36.0)
MCV: 93.2 fL (ref 80.0–100.0)
Platelets: 221 10*3/uL (ref 150–400)
RBC: 3.52 MIL/uL — ABNORMAL LOW (ref 3.87–5.11)
RDW: 13.1 % (ref 11.5–15.5)
WBC: 12.1 10*3/uL — ABNORMAL HIGH (ref 4.0–10.5)
nRBC: 0 % (ref 0.0–0.2)

## 2022-04-13 LAB — GLUCOSE, CAPILLARY
Glucose-Capillary: 154 mg/dL — ABNORMAL HIGH (ref 70–99)
Glucose-Capillary: 240 mg/dL — ABNORMAL HIGH (ref 70–99)

## 2022-04-13 LAB — BASIC METABOLIC PANEL
Anion gap: 6 (ref 5–15)
BUN: 15 mg/dL (ref 8–23)
CO2: 25 mmol/L (ref 22–32)
Calcium: 8.7 mg/dL — ABNORMAL LOW (ref 8.9–10.3)
Chloride: 108 mmol/L (ref 98–111)
Creatinine, Ser: 0.61 mg/dL (ref 0.44–1.00)
GFR, Estimated: >60 mL/min (ref >60)
Glucose, Bld: 166 mg/dL — ABNORMAL HIGH (ref 70–99)
Potassium: 4 mmol/L (ref 3.5–5.1)
Sodium: 139 mmol/L (ref 135–145)

## 2022-04-13 MED ORDER — ASPIRIN 81 MG PO CHEW: 81.0000 mg | CHEWABLE_TABLET | Freq: Two times a day (BID) | ORAL | 0 refills | Status: AC

## 2022-04-13 MED ORDER — SENNA 8.6 MG PO TABS: 1.0000 | ORAL_TABLET | Freq: Every day | ORAL | 0 refills | Status: AC

## 2022-04-13 MED ORDER — HYDROCODONE-ACETAMINOPHEN 5-325 MG PO TABS: 1.0000 | ORAL_TABLET | ORAL | 0 refills | Status: AC | PRN

## 2022-04-13 MED ORDER — POLYETHYLENE GLYCOL 3350 17 G PO PACK: 17.0000 g | PACK | Freq: Two times a day (BID) | ORAL | 0 refills | Status: AC

## 2022-04-13 MED ORDER — METHOCARBAMOL 500 MG PO TABS: 500.0000 mg | ORAL_TABLET | Freq: Four times a day (QID) | ORAL | 2 refills | Status: AC | PRN

## 2022-04-13 NOTE — Progress Notes (Signed)
Physical Therapy Treatment Patient Details Name: Stacey Santana MRN: [id number] DOB: February 23, 1946 Today's Date: 04/13/2022   History of Present Illness Pt is a 76 yo female presenting s/p L-THA, AA on 04/12/22. PMH: DM, GERD, HTN, PVD, OSA on CPAP, L-TSA 2019, depression.    PT Comments    Pt ambulated in hallway and practiced safe stair techniques.  Pt provided with HEP and stair handouts.  Daughter present and observed stairs.  Verbally reviewed exercises on handout pt performed this morning and then demonstrated and explained standing exercises for pt to add in a couple days.  Pt and family questions answered within scope of practice, and pt feels ready for d/c home today.     Recommendations for follow up therapy are one component of a multi-disciplinary discharge planning process, led by the attending physician.  Recommendations may be updated based on patient status, additional functional criteria and insurance authorization.  Follow Up Recommendations  Follow physician's recommendations for discharge plan and follow up therapies     Assistance Recommended at Discharge Frequent or constant Supervision/Assistance  Patient can return home with the following A little help with walking and/or transfers;A little help with bathing/dressing/bathroom;Assistance with cooking/housework;Help with stairs or ramp for entrance;Assist for transportation   Equipment Recommendations  Rolling walker (2 wheels)    Recommendations for Other Services       Precautions / Restrictions Precautions Precautions: Fall Restrictions Weight Bearing Restrictions: No LLE Weight Bearing: Weight bearing as tolerated     Mobility  Bed Mobility               General bed mobility comments: pt in recliner    Transfers Overall transfer level: Needs assistance Equipment used: Rolling walker (2 wheels) Transfers: Sit to/from Stand Sit to Stand: Min guard, Supervision           General transfer  comment: verbal cues for UE and LE positioning    Ambulation/Gait Ambulation/Gait assistance: Min guard, Supervision Gait Distance (Feet): 120 Feet Assistive device: Rolling walker (2 wheels) Gait Pattern/deviations: Step-to pattern, Decreased stance time - left, Antalgic, Step-through pattern Gait velocity: decreased     General Gait Details: verbal cues for sequence, step length, posture   Stairs Stairs: Yes Stairs assistance: Min guard Stair Management: Step to pattern, With walker, Backwards Number of Stairs: 3 General stair comments: verbal cues for safety, sequence and positioning; daughter held onto RW; pt also performed forwards with left rail and quad cane once   Wheelchair Mobility    Modified Rankin (Stroke Patients Only)       Balance                                            Cognition Arousal/Alertness: Awake/alert Behavior During Therapy: WFL for tasks assessed/performed Overall Cognitive Status: Within Functional Limits for tasks assessed                                          Exercises     General Comments        Pertinent Vitals/Pain Pain Assessment Pain Assessment: 0-10 Pain Score: 5  Pain Location: left hip Pain Descriptors / Indicators: Aching, Sore Pain Intervention(s): Repositioned, Monitored during session    Home Living  Prior Function            PT Goals (current goals can now be found in the care plan section) Progress towards PT goals: Progressing toward goals    Frequency    7X/week      PT Plan Current plan remains appropriate    Co-evaluation              AM-PAC PT "6 Clicks" Mobility   Outcome Measure  Help needed turning from your back to your side while in a flat bed without using bedrails?: None Help needed moving from lying on your back to sitting on the side of a flat bed without using bedrails?: A Little Help needed moving  to and from a bed to a chair (including a wheelchair)?: A Little Help needed standing up from a chair using your arms (e.g., wheelchair or bedside chair)?: A Little Help needed to walk in hospital room?: A Little Help needed climbing 3-5 steps with a railing? : A Little 6 Click Score: 19    End of Session Equipment Utilized During Treatment: Gait belt Activity Tolerance: Patient tolerated treatment well Patient left: in chair;with call bell/phone within reach;with family/visitor present   PT Visit Diagnosis: Pain;Difficulty in walking, not elsewhere classified (R26.2) Pain - Right/Left: Left Pain - part of body: Hip     Time: 1354-1416 PT Time Calculation (min) (ACUTE ONLY): 22 min  Charges:  $Gait Training: 8-22 mins                    Stacey Santana, Stacey Santana Physical Therapist Acute Rehabilitation Services Preferred contact method: Secure Chat Weekend Pager Only: [phone] Office: [phone]    Stacey Santana 04/13/2022, 3:46 PM

## 2022-04-13 NOTE — Progress Notes (Signed)
Physical Therapy Treatment Patient Details Name: Stacey Santana MRN: [id number] DOB: 02/24/1946 Today's Date: 04/13/2022   History of Present Illness Pt is a 77yo female presenting s/p L-THA, AA on 04/12/22. PMH: DM, GERD, HTN, PVD, OSA on CPAP, L-TSA 2019, depression.    PT Comments    Pt performed LE exercises and then ambulated short distance in hallway.  Pt to practice steps this afternoon and anticipates d/c home later today.   Recommendations for follow up therapy are one component of a multi-disciplinary discharge planning process, led by the attending physician.  Recommendations may be updated based on patient status, additional functional criteria and insurance authorization.  Follow Up Recommendations  Follow physician's recommendations for discharge plan and follow up therapies     Assistance Recommended at Discharge Frequent or constant Supervision/Assistance  Patient can return home with the following A little help with walking and/or transfers;A little help with bathing/dressing/bathroom;Assistance with cooking/housework;Help with stairs or ramp for entrance;Assist for transportation   Equipment Recommendations  Rolling walker (2 wheels)    Recommendations for Other Services       Precautions / Restrictions Precautions Precautions: Fall Restrictions Weight Bearing Restrictions: No LLE Weight Bearing: Weight bearing as tolerated     Mobility  Bed Mobility               General bed mobility comments: pt in recliner    Transfers Overall transfer level: Needs assistance Equipment used: Rolling walker (2 wheels) Transfers: Sit to/from Stand Sit to Stand: Min guard           General transfer comment: verbal cues for UE and LE positioning    Ambulation/Gait Ambulation/Gait assistance: Min guard Gait Distance (Feet): 40 Feet Assistive device: Rolling walker (2 wheels) Gait Pattern/deviations: Step-to pattern, Decreased stance time - left,  Antalgic Gait velocity: decreased     General Gait Details: verbal cues for sequence, step length, posture; distance to tolerance   Stairs             Wheelchair Mobility    Modified Rankin (Stroke Patients Only)       Balance                                            Cognition Arousal/Alertness: Awake/alert Behavior During Therapy: WFL for tasks assessed/performed Overall Cognitive Status: Within Functional Limits for tasks assessed                                          Exercises Total Joint Exercises Ankle Circles/Pumps: AROM, Both, 10 reps Quad Sets: AROM, Both, 10 reps Heel Slides: AAROM, Left, 10 reps Hip ABduction/ADduction: AAROM, Left, 10 reps Long Arc Quad: AROM, Left, 10 reps, Seated    General Comments        Pertinent Vitals/Pain Pain Assessment Pain Assessment: 0-10 Pain Score: 5  Pain Location: left hip Pain Descriptors / Indicators: Aching, Sore Pain Intervention(s): Repositioned, Monitored during session, Premedicated before session    Home Living                          Prior Function            PT Goals (current goals can now be found in the care plan section) Progress  towards PT goals: Progressing toward goals    Frequency    7X/week      PT Plan Current plan remains appropriate    Co-evaluation              AM-PAC PT "6 Clicks" Mobility   Outcome Measure  Help needed turning from your back to your side while in a flat bed without using bedrails?: None Help needed moving from lying on your back to sitting on the side of a flat bed without using bedrails?: A Little Help needed moving to and from a bed to a chair (including a wheelchair)?: A Little Help needed standing up from a chair using your arms (e.g., wheelchair or bedside chair)?: A Little Help needed to walk in hospital room?: A Little Help needed climbing 3-5 steps with a railing? : A Little 6 Click  Score: 19    End of Session Equipment Utilized During Treatment: Gait belt Activity Tolerance: Patient tolerated treatment well Patient left: in chair;with call bell/phone within reach;with family/visitor present;with chair alarm set   PT Visit Diagnosis: Pain;Difficulty in walking, not elsewhere classified (R26.2) Pain - Right/Left: Left Pain - part of body: Hip     Time: 0092-3300 PT Time Calculation (min) (ACUTE ONLY): 22 min  Charges:  $Therapeutic Exercise: 8-22 mins                    Stacey Santana PT, DPT Physical Therapist Acute Rehabilitation Services Preferred contact method: Secure Chat Weekend Pager Only: [phone] Office: [phone]    Stacey Santana Stacey Santana 04/13/2022, 12:13 PM

## 2022-04-13 NOTE — Progress Notes (Signed)
   Subjective: 1 Day Post-Op Procedure(s) (LRB): TOTAL HIP ARTHROPLASTY ANTERIOR APPROACH (Left) Patient reports pain as mild.   Patient seen in rounds by Dr. Alvan Dame. Patient is resting in bed on exam this morning. No acute events overnight. Ambulated 32 feet with PT yesterday. We will continue therapy today.   Objective: Vital signs in last 24 hours: Temp:  [96.6 F (35.9 C)-98.4 F (36.9 C)] 98.2 F (36.8 C) (12/13 0613) Pulse Rate:  [50-100] 86 (12/13 0613) Resp:  [14-21] 14 (12/13 0613) BP: (114-184)/(47-83) 149/47 (12/13 0613) SpO2:  [93 %-100 %] 97 % (12/13 0613) Weight:  [88 kg-88.9 kg] 88.9 kg (12/12 1400)  Intake/Output from previous day:  Intake/Output Summary (Last 24 hours) at 04/13/2022 0741 Last data filed at 04/13/2022 0533 Gross per 24 hour  Intake 2896.3 ml  Output 3375 ml  Net -478.7 ml     Intake/Output this shift: No intake/output data recorded.  Labs: Recent Labs    04/13/22 0335  HGB 10.5*   Recent Labs    04/13/22 0335  WBC 12.1*  RBC 3.52*  HCT 32.8*  PLT 221   Recent Labs    04/13/22 0335  NA 139  K 4.0  CL 108  CO2 25  BUN 15  CREATININE 0.61  GLUCOSE 166*  CALCIUM 8.7*   No results for input(s): "LABPT", "INR" in the last 72 hours.  Exam: General - Patient is Alert and Oriented Extremity - Neurologically intact Sensation intact distally Intact pulses distally Dorsiflexion/Plantar flexion intact Dressing - dressing C/D/I Motor Function - intact, moving foot and toes well on exam.   Past Medical History:  Diagnosis Date   Anxiety    Arthritis    some degenerative signs in other places of her body    Complication of anesthesia 1993   used scopolamine patch during surgery- manic episode occurred post op , met with the anesth. dept. afterwards & they thought that the episode was related to scop patch.    Depression    chronic    Diabetes mellitus without complication (HCC)    type II   GERD (gastroesophageal reflux  disease)    High cholesterol    Hypertension    Peripheral vascular disease (HCC)    Sleep apnea    CPAP     Assessment/Plan: 1 Day Post-Op Procedure(s) (LRB): TOTAL HIP ARTHROPLASTY ANTERIOR APPROACH (Left) Principal Problem:   S/P total left hip arthroplasty  Estimated body mass index is 33.91 kg/m as calculated from the following:   Height as of this encounter: 5' 3.75" (1.619 m).   Weight as of this encounter: 88.9 kg. Advance diet Up with therapy D/C IV fluids  DVT Prophylaxis - Aspirin Weight bearing as tolerated.  Hgb stable at 10.5 this AM.  Plan is to go Home after hospital stay. Plan for discharge today after meeting goals with therapy. Follow up in the office in 2 weeks.   Griffith Citron, PA-C Orthopedic Surgery [phone] 04/13/2022, 7:41 AM

## 2022-04-13 NOTE — TOC Transition Note (Signed)
Transition of Care [facility]) - CM/SW Discharge Note  Patient Details  Name: Stacey Santana MRN: [id number] Date of Birth: Sep 19, 1945  [facility]) CM/SW Contact:  Sherie Don, LCSW Phone Number: 04/13/2022, 10:27 AM  Clinical Narrative: Patient is expected to discharge home after working with PT. CSW met with patient to confirm discharge plan and needs. Patient will go home with a home exercise program (HEP). Patient will need a rolling walker, which was delivered to patient's room by [employer]. TOC signing off.  Final next level of care: Home/Self Care Barriers to Discharge: No Barriers Identified  Patient Goals and CMS Choice Patient states their goals for this hospitalization and ongoing recovery are:: Discharge home with HEP CMS Medicare.gov Compare Post Acute Care list provided to:: Patient Choice offered to / list presented to : Patient  Discharge Plan and Services        DME Arranged: Walker rolling DME Agency: [employer]  Social Determinants of Health (SDOH) Interventions    Readmission Risk Interventions     No data to display

## 2022-04-14 ENCOUNTER — Encounter (HOSPITAL_COMMUNITY): Payer: Self-pay | Admitting: Orthopedic Surgery

## 2022-04-19 NOTE — Discharge Summary (Signed)
Patient ID: Stacey Santana MRN: [id number] DOB/AGE: 1945-09-22 76 y.o.  Admit date: 04/12/2022 Discharge date: 04/13/2022  Admission Diagnoses:  Left hip osteoarthritis  Discharge Diagnoses:  Principal Problem:   S/P total left hip arthroplasty   Past Medical History:  Diagnosis Date   Anxiety    Arthritis    some degenerative signs in other places of her body    Complication of anesthesia 1993   used scopolamine patch during surgery- manic episode occurred post op , met with the anesth. dept. afterwards & they thought that the episode was related to scop patch.    Depression    chronic    Diabetes mellitus without complication ([place])    type II   GERD (gastroesophageal reflux disease)    High cholesterol    Hypertension    Peripheral vascular disease ([place])    Sleep apnea    CPAP     Surgeries: Procedure(s): TOTAL HIP ARTHROPLASTY ANTERIOR APPROACH on 04/12/2022   Consultants:   Discharged Condition: Improved  Hospital Course: Stacey Santana is an 76 y.o. female who was admitted 04/12/2022 for operative treatment ofS/P total left hip arthroplasty. Patient has severe unremitting pain that affects sleep, daily activities, and work/hobbies. After pre-op clearance the patient was taken to the operating room on 04/12/2022 and underwent  Procedure(s): TOTAL HIP ARTHROPLASTY ANTERIOR APPROACH.    Patient was given perioperative antibiotics:  Anti-infectives (From admission, onward)    Start     Dose/Rate Route Frequency Ordered Stop   04/12/22 1500  ceFAZolin (ANCEF) IVPB 2g/100 mL premix        2 g 200 mL/hr over 30 Minutes Intravenous Every 6 hours 04/12/22 1406 04/12/22 2234   04/12/22 0745  ceFAZolin (ANCEF) IVPB 2g/100 mL premix        2 g 200 mL/hr over 30 Minutes Intravenous On call to O.R. 04/12/22 0737 04/12/22 1009        Patient was given sequential compression devices, early ambulation, and chemoprophylaxis to prevent DVT. Patient worked with PT and was  meeting their goals regarding safe ambulation and transfers.  Patient benefited maximally from hospital stay and there were no complications.    Recent vital signs: No data found.   Recent laboratory studies: No results for input(s): "WBC", "HGB", "HCT", "PLT", "NA", "K", "CL", "CO2", "BUN", "CREATININE", "GLUCOSE", "INR", "CALCIUM" in the last 72 hours.  Invalid input(s): "PT", "2"   Discharge Medications:   Allergies as of 04/13/2022       Reactions   Hydroxyzine Other (See Comments)   Dizziness   Meloxicam Nausea And Vomiting      Oxycodone-acetaminophen    Didn't work   Amoxicillin-pot Clavulanate Diarrhea   Aripiprazole    ineffective, dizziness   Brexpiprazole    Ineffective   Bupropion Hcl Er (xl)    ineffective   Clonazepam Other (See Comments)   dizziness   Darvon [propoxyphene] Nausea And Vomiting   Escitalopram Oxalate    ineffective   Fluoxetine    ineffective after 10 yrs   Pollen Extract    Scopolamine Other (See Comments)   Psych. Disturbance ?   Sertraline Hcl    ineffective after 10 yrs   Viibryd [vilazodone Hcl] Diarrhea        Medication List     STOP taking these medications    diclofenac 75 MG EC tablet Commonly known as: VOLTAREN   ibuprofen 200 MG tablet Commonly known as: ADVIL   traMADol 50 MG tablet Commonly known as: Veatrice Bourbon  zaleplon 10 MG capsule Commonly known as: SONATA       TAKE these medications    ALPRAZolam 0.25 MG tablet Commonly known as: XANAX TAKE 1 TABLET BY MOUTH 3 TIMES DAILY AS NEEDED FOR ANXIETY.   aspirin 81 MG chewable tablet Chew 1 tablet (81 mg total) by mouth 2 (two) times daily for 28 days.   CALCIUM-MAGNESIUM-ZINC PO Take 1 tablet by mouth at bedtime.   DULoxetine 30 MG capsule Commonly known as: CYMBALTA Take 1 capsule (30 mg total) by mouth 2 (two) times daily.   HYDROcodone-acetaminophen 5-325 MG tablet Commonly known as: NORCO/VICODIN Take 1 tablet by mouth every 4 (four) hours  as needed for severe pain.   losartan 100 MG tablet Commonly known as: COZAAR Take 100 mg by mouth at bedtime.   Melatonin 10 MG Tabs Take 10 mg by mouth at bedtime.   metFORMIN 500 MG 24 hr tablet Commonly known as: GLUCOPHAGE-XR Take 500 mg by mouth 2 (two) times daily with a meal.   methocarbamol 500 MG tablet Commonly known as: ROBAXIN Take 1 tablet (500 mg total) by mouth every 6 (six) hours as needed for muscle spasms (muscle pain).   Methylcobalamin 1000 MCG Lozg Take 1,000 mcg by mouth every Monday, Wednesday, and Friday.   metoprolol succinate 25 MG 24 hr tablet Commonly known as: TOPROL-XL Take 25 mg by mouth daily.   Myrbetriq 50 MG Tb24 tablet Generic drug: mirabegron ER Take 50 mg by mouth daily.   omeprazole 20 MG capsule Commonly known as: PRILOSEC Take 20 mg by mouth at bedtime.   OVER THE COUNTER MEDICATION Apply 1 Application topically daily as needed (pain). CBD cream   polyethylene glycol 17 g packet Commonly known as: MIRALAX / GLYCOLAX Take 17 g by mouth 2 (two) times daily.   PreserVision AREDS 2 Caps Take 1 capsule by mouth 2 (two) times daily.   PROBIOTIC PO Take 1 capsule by mouth daily.   senna 8.6 MG Tabs tablet Commonly known as: SENOKOT Take 1 tablet (8.6 mg total) by mouth at bedtime for 14 days.   simvastatin 20 MG tablet Commonly known as: ZOCOR Take 20 mg by mouth at bedtime.   Vitamin D3 25 MCG (1000 UT) Caps Take 1,000 Units by mouth daily.               Discharge Care Instructions  (From admission, onward)           Start     Ordered   04/13/22 0000  Change dressing       Comments: Maintain surgical dressing until follow up in the clinic. If the edges start to pull up, may reinforce with tape. If the dressing is no longer working, may remove and cover with gauze and tape, but must keep the area dry and clean.  Call with any questions or concerns.   04/13/22 0744            Diagnostic Studies: DG  Pelvis Portable  Result Date: 04/12/2022 CLINICAL DATA:  Total left hip arthroplasty. EXAM: PORTABLE PELVIS 1-2 VIEWS COMPARISON:  None Available. FINDINGS: Total left hip arthroplasty with well seated components. No complicating features. IMPRESSION: Total left hip arthroplasty with well seated components. Electronically Signed   By: Marijo Sanes M.D.   On: 04/12/2022 11:53   DG HIP UNILAT WITH PELVIS 2-3 VIEWS LEFT  Result Date: 04/12/2022 CLINICAL DATA:  Total left hip arthroplasty. Intraoperative fluoroscopy. EXAM: DG HIP (WITH OR WITHOUT PELVIS) 2-3V LEFT  COMPARISON:  Pelvis and left hip radiographs 09/02/2021 FINDINGS: Images were performed intraoperatively without the presence of a radiologist. Severe superior left femoroacetabular joint space narrowing. The patient is undergoing total left hip arthroplasty. No hardware complication is seen. Total fluoroscopy images: 5 Total fluoroscopy time: 14 seconds Total dose: Radiation Exposure Index (as provided by the fluoroscopic device): 1.588 mGy air Kerma Please see intraoperative findings for further detail. IMPRESSION: Intraoperative fluoroscopy for total left hip arthroplasty. Electronically Signed   By: Yvonne Kendall M.D.   On: 04/12/2022 11:01   DG C-Arm 1-60 Min-No Report  Result Date: 04/12/2022 Fluoroscopy was utilized by the requesting physician.  No radiographic interpretation.   DG C-Arm 1-60 Min-No Report  Result Date: 04/12/2022 Fluoroscopy was utilized by the requesting physician.  No radiographic interpretation.   MM 3D SCREEN BREAST BILATERAL  Result Date: 04/11/2022 CLINICAL DATA:  Screening. EXAM: DIGITAL SCREENING BILATERAL MAMMOGRAM WITH TOMOSYNTHESIS AND CAD TECHNIQUE: Bilateral screening digital craniocaudal and mediolateral oblique mammograms were obtained. Bilateral screening digital breast tomosynthesis was performed. The images were evaluated with computer-aided detection. COMPARISON:  Previous exam(s). ACR Breast  Density Category b: There are scattered areas of fibroglandular density. FINDINGS: There are no findings suspicious for malignancy. IMPRESSION: No mammographic evidence of malignancy. A result letter of this screening mammogram will be mailed directly to the patient. RECOMMENDATION: Screening mammogram in one year. (Code:SM-B-01Y) BI-RADS CATEGORY  1: Negative. Electronically Signed   By: Franki Cabot M.D.   On: 04/11/2022 09:36    Disposition: Discharge disposition: 01-Home or Self Care       Discharge Instructions     Call MD / Call 911   Complete by: As directed    If you experience chest pain or shortness of breath, CALL 911 and be transported to the hospital emergency room.  If you develope a fever above 101 F, pus (white drainage) or increased drainage or redness at the wound, or calf pain, call your surgeon's office.   Change dressing   Complete by: As directed    Maintain surgical dressing until follow up in the clinic. If the edges start to pull up, may reinforce with tape. If the dressing is no longer working, may remove and cover with gauze and tape, but must keep the area dry and clean.  Call with any questions or concerns.   Constipation Prevention   Complete by: As directed    Drink plenty of fluids.  Prune juice may be helpful.  You may use a stool softener, such as Colace (over the counter) 100 mg twice a day.  Use MiraLax (over the counter) for constipation as needed.   Diet - low sodium heart healthy   Complete by: As directed    Increase activity slowly as tolerated   Complete by: As directed    Weight bearing as tolerated with assist device (walker, cane, etc) as directed, use it as long as suggested by your surgeon or therapist, typically at least 4-6 weeks.   Post-operative opioid taper instructions:   Complete by: As directed    POST-OPERATIVE OPIOID TAPER INSTRUCTIONS: It is important to wean off of your opioid medication as soon as possible. If you do not need  pain medication after your surgery it is ok to stop day one. Opioids include: Codeine, Hydrocodone(Norco, Vicodin), Oxycodone(Percocet, oxycontin) and hydromorphone amongst others.  Long term and even short term use of opiods can cause: Increased pain response Dependence Constipation Depression Respiratory depression And more.  Withdrawal symptoms can include  Flu like symptoms Nausea, vomiting And more Techniques to manage these symptoms Hydrate well Eat regular healthy meals Stay active Use relaxation techniques(deep breathing, meditating, yoga) Do Not substitute Alcohol to help with tapering If you have been on opioids for less than two weeks and do not have pain than it is ok to stop all together.  Plan to wean off of opioids This plan should start within one week post op of your joint replacement. Maintain the same interval or time between taking each dose and first decrease the dose.  Cut the total daily intake of opioids by one tablet each day Next start to increase the time between doses. The last dose that should be eliminated is the evening dose.      TED hose   Complete by: As directed    Use stockings (TED hose) for 2 weeks on both leg(s).  You may remove them at night for sleeping.        Follow-up Information     Paralee Cancel, MD. Go on 04/27/2022.   Specialty: Orthopedic Surgery Why: You are scheduled for first post op appt on Wednesday Dec 27 at 3:00pm. Contact information: [address] [place] [address] [phone]                  Signed: Irving Copas 04/19/2022, 7:15 AM

## 2022-05-23 DIAGNOSIS — G4733 Obstructive sleep apnea (adult) (pediatric): Secondary | ICD-10-CM | POA: Diagnosis not present

## 2022-06-09 DIAGNOSIS — Z471 Aftercare following joint replacement surgery: Secondary | ICD-10-CM | POA: Diagnosis not present

## 2022-06-09 DIAGNOSIS — Z96642 Presence of left artificial hip joint: Secondary | ICD-10-CM | POA: Diagnosis not present

## 2022-06-29 DIAGNOSIS — E1151 Type 2 diabetes mellitus with diabetic peripheral angiopathy without gangrene: Secondary | ICD-10-CM | POA: Diagnosis not present

## 2022-06-29 DIAGNOSIS — M19072 Primary osteoarthritis, left ankle and foot: Secondary | ICD-10-CM | POA: Diagnosis not present

## 2022-06-29 DIAGNOSIS — I739 Peripheral vascular disease, unspecified: Secondary | ICD-10-CM | POA: Diagnosis not present

## 2022-06-29 DIAGNOSIS — M19071 Primary osteoarthritis, right ankle and foot: Secondary | ICD-10-CM | POA: Diagnosis not present

## 2022-07-03 ENCOUNTER — Other Ambulatory Visit: Payer: Self-pay | Admitting: Adult Health

## 2022-07-03 DIAGNOSIS — F411 Generalized anxiety disorder: Secondary | ICD-10-CM

## 2022-07-03 DIAGNOSIS — G4700 Insomnia, unspecified: Secondary | ICD-10-CM

## 2022-07-06 MED ORDER — ALPRAZOLAM 0.25 MG PO TABS: ORAL_TABLET | ORAL | 0 refills | Status: DC

## 2022-07-15 ENCOUNTER — Ambulatory Visit
Admission: RE | Admit: 2022-07-15 | Discharge: 2022-07-15 | Disposition: A | Discharge status: Home / Self Care | Payer: Medicare Other | Source: Ambulatory Visit | Attending: Family Medicine | Admitting: Family Medicine

## 2022-07-15 DIAGNOSIS — Z780 Asymptomatic menopausal state: Secondary | ICD-10-CM | POA: Diagnosis not present

## 2022-07-15 DIAGNOSIS — M8589 Other specified disorders of bone density and structure, multiple sites: Secondary | ICD-10-CM | POA: Diagnosis not present

## 2022-07-15 DIAGNOSIS — M85851 Other specified disorders of bone density and structure, right thigh: Secondary | ICD-10-CM

## 2022-07-26 DIAGNOSIS — Z96642 Presence of left artificial hip joint: Secondary | ICD-10-CM | POA: Diagnosis not present

## 2022-07-26 DIAGNOSIS — M25552 Pain in left hip: Secondary | ICD-10-CM | POA: Diagnosis not present

## 2022-07-26 DIAGNOSIS — R531 Weakness: Secondary | ICD-10-CM | POA: Diagnosis not present

## 2022-07-29 DIAGNOSIS — R531 Weakness: Secondary | ICD-10-CM | POA: Diagnosis not present

## 2022-07-29 DIAGNOSIS — Z96642 Presence of left artificial hip joint: Secondary | ICD-10-CM | POA: Diagnosis not present

## 2022-07-29 DIAGNOSIS — M25552 Pain in left hip: Secondary | ICD-10-CM | POA: Diagnosis not present

## 2022-08-02 DIAGNOSIS — Z96642 Presence of left artificial hip joint: Secondary | ICD-10-CM | POA: Diagnosis not present

## 2022-08-02 DIAGNOSIS — R531 Weakness: Secondary | ICD-10-CM | POA: Diagnosis not present

## 2022-08-02 DIAGNOSIS — M25552 Pain in left hip: Secondary | ICD-10-CM | POA: Diagnosis not present

## 2022-08-05 DIAGNOSIS — Z96642 Presence of left artificial hip joint: Secondary | ICD-10-CM | POA: Diagnosis not present

## 2022-08-05 DIAGNOSIS — M25552 Pain in left hip: Secondary | ICD-10-CM | POA: Diagnosis not present

## 2022-08-05 DIAGNOSIS — R531 Weakness: Secondary | ICD-10-CM | POA: Diagnosis not present

## 2022-08-09 DIAGNOSIS — Z96642 Presence of left artificial hip joint: Secondary | ICD-10-CM | POA: Diagnosis not present

## 2022-08-09 DIAGNOSIS — M25552 Pain in left hip: Secondary | ICD-10-CM | POA: Diagnosis not present

## 2022-08-09 DIAGNOSIS — R531 Weakness: Secondary | ICD-10-CM | POA: Diagnosis not present

## 2022-08-12 DIAGNOSIS — R531 Weakness: Secondary | ICD-10-CM | POA: Diagnosis not present

## 2022-08-12 DIAGNOSIS — M25552 Pain in left hip: Secondary | ICD-10-CM | POA: Diagnosis not present

## 2022-08-12 DIAGNOSIS — Z96642 Presence of left artificial hip joint: Secondary | ICD-10-CM | POA: Diagnosis not present

## 2022-08-15 DIAGNOSIS — I700 Atherosclerosis of aorta: Secondary | ICD-10-CM | POA: Diagnosis not present

## 2022-08-15 DIAGNOSIS — R531 Weakness: Secondary | ICD-10-CM | POA: Diagnosis not present

## 2022-08-15 DIAGNOSIS — Z96642 Presence of left artificial hip joint: Secondary | ICD-10-CM | POA: Diagnosis not present

## 2022-08-15 DIAGNOSIS — M5459 Other low back pain: Secondary | ICD-10-CM | POA: Diagnosis not present

## 2022-08-15 DIAGNOSIS — E785 Hyperlipidemia, unspecified: Secondary | ICD-10-CM | POA: Diagnosis not present

## 2022-08-15 DIAGNOSIS — M25552 Pain in left hip: Secondary | ICD-10-CM | POA: Diagnosis not present

## 2022-08-19 DIAGNOSIS — M25552 Pain in left hip: Secondary | ICD-10-CM | POA: Diagnosis not present

## 2022-08-19 DIAGNOSIS — Z96642 Presence of left artificial hip joint: Secondary | ICD-10-CM | POA: Diagnosis not present

## 2022-08-19 DIAGNOSIS — R531 Weakness: Secondary | ICD-10-CM | POA: Diagnosis not present

## 2022-08-25 DIAGNOSIS — M5459 Other low back pain: Secondary | ICD-10-CM | POA: Diagnosis not present

## 2022-08-25 DIAGNOSIS — M545 Low back pain, unspecified: Secondary | ICD-10-CM | POA: Diagnosis not present

## 2022-08-29 DIAGNOSIS — M25552 Pain in left hip: Secondary | ICD-10-CM | POA: Diagnosis not present

## 2022-08-29 DIAGNOSIS — Z96642 Presence of left artificial hip joint: Secondary | ICD-10-CM | POA: Diagnosis not present

## 2022-08-29 DIAGNOSIS — R531 Weakness: Secondary | ICD-10-CM | POA: Diagnosis not present

## 2022-08-30 DIAGNOSIS — E785 Hyperlipidemia, unspecified: Secondary | ICD-10-CM | POA: Diagnosis not present

## 2022-08-30 DIAGNOSIS — E1159 Type 2 diabetes mellitus with other circulatory complications: Secondary | ICD-10-CM | POA: Diagnosis not present

## 2022-09-02 DIAGNOSIS — Z96642 Presence of left artificial hip joint: Secondary | ICD-10-CM | POA: Diagnosis not present

## 2022-09-02 DIAGNOSIS — R531 Weakness: Secondary | ICD-10-CM | POA: Diagnosis not present

## 2022-09-02 DIAGNOSIS — M25552 Pain in left hip: Secondary | ICD-10-CM | POA: Diagnosis not present

## 2022-09-05 DIAGNOSIS — M25511 Pain in right shoulder: Secondary | ICD-10-CM | POA: Diagnosis not present

## 2022-09-05 DIAGNOSIS — M5136 Other intervertebral disc degeneration, lumbar region: Secondary | ICD-10-CM | POA: Diagnosis not present

## 2022-09-06 ENCOUNTER — Telehealth: Payer: Self-pay | Admitting: Adult Health

## 2022-09-06 DIAGNOSIS — Z96642 Presence of left artificial hip joint: Secondary | ICD-10-CM | POA: Diagnosis not present

## 2022-09-06 DIAGNOSIS — R531 Weakness: Secondary | ICD-10-CM | POA: Diagnosis not present

## 2022-09-06 DIAGNOSIS — M25552 Pain in left hip: Secondary | ICD-10-CM | POA: Diagnosis not present

## 2022-09-06 NOTE — Telephone Encounter (Signed)
Will discuss options at her appt. Last appt doing well on Cymbalta.

## 2022-09-06 NOTE — Telephone Encounter (Signed)
Pt LVM @ 11:25a.  She said she has an appt coming up this Friday, May 10, but she wants to talk to someone about some concerns she has before the appt so Almira Coaster can think about it.  Next appt 5/10

## 2022-09-06 NOTE — Telephone Encounter (Signed)
Patient has an appt Friday. She is currently taking Cymbalta 60 mg and said it isn't working. She rates her depression as a 10/10. She said it hasn't been this bad since April 2022. She reports passive SI but has no way to accomplish it. She is anxious over medications. She asked that you discuss with your cohorts/Dr. Jennelle Human to see if there was something you haven't tried that they can recommend.   Previous medication trials: Prozac, Zoloft, Depakote, Lamictal Clonazepam, Xanax, Effexor, Sonata, Wellbutrin, Rexulti, Abilify

## 2022-09-07 DIAGNOSIS — K219 Gastro-esophageal reflux disease without esophagitis: Secondary | ICD-10-CM | POA: Diagnosis not present

## 2022-09-07 DIAGNOSIS — F3341 Major depressive disorder, recurrent, in partial remission: Secondary | ICD-10-CM | POA: Diagnosis not present

## 2022-09-07 DIAGNOSIS — E538 Deficiency of other specified B group vitamins: Secondary | ICD-10-CM | POA: Diagnosis not present

## 2022-09-07 DIAGNOSIS — I10 Essential (primary) hypertension: Secondary | ICD-10-CM | POA: Diagnosis not present

## 2022-09-07 DIAGNOSIS — I700 Atherosclerosis of aorta: Secondary | ICD-10-CM | POA: Diagnosis not present

## 2022-09-07 DIAGNOSIS — M158 Other polyosteoarthritis: Secondary | ICD-10-CM | POA: Diagnosis not present

## 2022-09-07 DIAGNOSIS — M85851 Other specified disorders of bone density and structure, right thigh: Secondary | ICD-10-CM | POA: Diagnosis not present

## 2022-09-07 DIAGNOSIS — G4733 Obstructive sleep apnea (adult) (pediatric): Secondary | ICD-10-CM | POA: Diagnosis not present

## 2022-09-07 DIAGNOSIS — E1159 Type 2 diabetes mellitus with other circulatory complications: Secondary | ICD-10-CM | POA: Diagnosis not present

## 2022-09-07 DIAGNOSIS — N3941 Urge incontinence: Secondary | ICD-10-CM | POA: Diagnosis not present

## 2022-09-07 DIAGNOSIS — E785 Hyperlipidemia, unspecified: Secondary | ICD-10-CM | POA: Diagnosis not present

## 2022-09-08 DIAGNOSIS — R531 Weakness: Secondary | ICD-10-CM | POA: Diagnosis not present

## 2022-09-08 DIAGNOSIS — Z96642 Presence of left artificial hip joint: Secondary | ICD-10-CM | POA: Diagnosis not present

## 2022-09-08 DIAGNOSIS — M25552 Pain in left hip: Secondary | ICD-10-CM | POA: Diagnosis not present

## 2022-09-09 ENCOUNTER — Ambulatory Visit (INDEPENDENT_AMBULATORY_CARE_PROVIDER_SITE_OTHER): Payer: Medicare Other | Admitting: Adult Health

## 2022-09-09 ENCOUNTER — Encounter: Payer: Self-pay | Admitting: Adult Health

## 2022-09-09 DIAGNOSIS — F331 Major depressive disorder, recurrent, moderate: Secondary | ICD-10-CM | POA: Diagnosis not present

## 2022-09-09 DIAGNOSIS — G4700 Insomnia, unspecified: Secondary | ICD-10-CM

## 2022-09-09 DIAGNOSIS — F411 Generalized anxiety disorder: Secondary | ICD-10-CM | POA: Diagnosis not present

## 2022-09-09 NOTE — Progress Notes (Signed)
Stacey Santana [id number] 1945/09/01 77 y.o.  Subjective:   Patient ID:  Stacey Santana is a 77 y.o. (DOB 1945-05-17) female.  Chief Complaint: No chief complaint on file.   HPI Phylecia Kubilus presents to the office today for follow-up of GAD, insomnia and MDD.   Describes mood today as "not too good". Pleasant. Reports tearfulness. Mood symptoms - reports depression, anxiety, and irritability. Reports some worry, rumination, and over thinking. Reports feeling cold - having hot sweats. Mood is lower - unable to identify a precipitant. Reports current symptoms started at [place] with a sudden onset. Stating "I feel kind of numb - no reactions to things". Reports taking the Cymbalta 30mg  twice daily and no longer feels it is helping to manage mood symptoms. Willing to consider other options. Planning to stay with a friend for a while until she starts feeling better. Decreased interest and motivation. Taking medications as prescribed.  Energy levels lower. Working with P/T. Active, does not have a regular exercise routine. Enjoys some usual interests and activities. Lives alone. Legally separated for past 14 years. Spending time with family. Appetite adequate - "forcing herself to eat". Weight loss - 185 from 196 pounds. Sleeps well most nights - taking Sonata. Averages 9 to 10 hours. Using CPAP machine. Focus and concentration "not good". Completing tasks. Managing some aspects of household. Retired. Denies SI or HI.  Denies AH or VH. Denies substance use. Denies self harm.   Previous medication trials: Prozac - 10 years+, Zoloft - 10 years+, Depakote, Lamictal Clonazepam, Xanax, Effexor 15 years, Sonata, Wellbutrin, Rexulti, Abilify   Flowsheet Row Admission (Discharged) from 04/12/2022 in [facility] [facility]  C-SSRS RISK CATEGORY No Risk       Review of Systems:  Review of Systems  Musculoskeletal:  Negative for gait problem.  Neurological:  Negative for tremors.   Psychiatric/Behavioral:         Please refer to HPI    Medications: I have reviewed the patient's current medications.  Current Outpatient Medications  Medication Sig Dispense Refill   ALPRAZolam (XANAX) 0.25 MG tablet TAKE 1 TABLET BY MOUTH THREE TIMES A DAY AS NEEDED FOR ANXIETY 90 tablet 0   CALCIUM-MAGNESIUM-ZINC PO Take 1 tablet by mouth at bedtime.     Cholecalciferol (VITAMIN D3) 1000 units CAPS Take 1,000 Units by mouth daily.      DULoxetine (CYMBALTA) 30 MG capsule Take 1 capsule (30 mg total) by mouth 2 (two) times daily. 180 capsule 1   HYDROcodone-acetaminophen (NORCO/VICODIN) 5-325 MG tablet Take 1 tablet by mouth every 4 (four) hours as needed for severe pain. 42 tablet 0   losartan (COZAAR) 100 MG tablet Take 100 mg by mouth at bedtime.      Melatonin 10 MG TABS Take 10 mg by mouth at bedtime.     metFORMIN (GLUCOPHAGE-XR) 500 MG 24 hr tablet Take 500 mg by mouth 2 (two) times daily with a meal.     methocarbamol (ROBAXIN) 500 MG tablet Take 1 tablet (500 mg total) by mouth every 6 (six) hours as needed for muscle spasms (muscle pain). 40 tablet 2   Methylcobalamin 1000 MCG LOZG Take 1,000 mcg by mouth every Monday, Wednesday, and Friday.     metoprolol succinate (TOPROL-XL) 25 MG 24 hr tablet Take 25 mg by mouth daily.     Multiple Vitamins-Minerals (PRESERVISION AREDS 2) CAPS Take 1 capsule by mouth 2 (two) times daily.     MYRBETRIQ 50 MG TB24 tablet Take 50 mg by mouth  daily.      omeprazole (PRILOSEC) 20 MG capsule Take 20 mg by mouth at bedtime.      OVER THE COUNTER MEDICATION Apply 1 Application topically daily as needed (pain). CBD cream     polyethylene glycol (MIRALAX / GLYCOLAX) 17 g packet Take 17 g by mouth 2 (two) times daily. 14 each 0   Probiotic Product (PROBIOTIC PO) Take 1 capsule by mouth daily.     simvastatin (ZOCOR) 20 MG tablet Take 20 mg by mouth at bedtime.      zaleplon (SONATA) 10 MG capsule TAKE 1 CAPSULE BY MOUTH AT BEDTIME AS NEEDED 30  capsule 0   No current facility-administered medications for this visit.    Medication Side Effects: None  Allergies:  Allergies  Allergen Reactions   Hydroxyzine Other (See Comments)    Dizziness    Meloxicam Nausea And Vomiting        Oxycodone-Acetaminophen     Didn't work   Amoxicillin-Pot Clavulanate Diarrhea   Aripiprazole     ineffective, dizziness   Brexpiprazole     Ineffective   Bupropion Hcl Er (Xl)     ineffective   Clonazepam Other (See Comments)    dizziness   Darvon [Propoxyphene] Nausea And Vomiting   Escitalopram Oxalate     ineffective   Fluoxetine     ineffective after 10 yrs   Pollen Extract    Scopolamine Other (See Comments)    Psych. Disturbance ?   Sertraline Hcl     ineffective after 10 yrs   Viibryd [Vilazodone Hcl] Diarrhea    Past Medical History:  Diagnosis Date   Anxiety    Arthritis    some degenerative signs in other places of her body    Complication of anesthesia 1993   used scopolamine patch during surgery- manic episode occurred post op , met with the anesth. dept. afterwards & they thought that the episode was related to scop patch.    Depression    chronic    Diabetes mellitus without complication (HCC)    type II   GERD (gastroesophageal reflux disease)    High cholesterol    Hypertension    Peripheral vascular disease (HCC)    Sleep apnea    CPAP     Past Medical History, Surgical history, Social history, and Family history were reviewed and updated as appropriate.   Please see review of systems for further details on the patient's review from today.   Objective:   Physical Exam:  There were no vitals taken for this visit.  Physical Exam Constitutional:      General: She is not in acute distress. Musculoskeletal:        General: No deformity.  Neurological:     Mental Status: She is alert and oriented to person, place, and time.     Coordination: Coordination normal.  Psychiatric:        Attention and  Perception: Attention and perception normal. She does not perceive auditory or visual hallucinations.        Mood and Affect: Mood normal. Mood is not anxious or depressed. Affect is not labile, blunt, angry or inappropriate.        Speech: Speech normal.        Behavior: Behavior normal.        Thought Content: Thought content normal. Thought content is not paranoid or delusional. Thought content does not include homicidal or suicidal ideation. Thought content does not include homicidal or suicidal plan.  Cognition and Memory: Cognition and memory normal.        Judgment: Judgment normal.     Comments: Insight intact     Lab Review:     Component Value Date/Time   NA 139 04/13/2022 0335   K 4.0 04/13/2022 0335   CL 108 04/13/2022 0335   CO2 25 04/13/2022 0335   GLUCOSE 166 (H) 04/13/2022 0335   BUN 15 04/13/2022 0335   CREATININE 0.61 04/13/2022 0335   CALCIUM 8.7 (L) 04/13/2022 0335   GFRNONAA >60 04/13/2022 0335   GFRAA >60 05/18/2017 1305       Component Value Date/Time   WBC 12.1 (H) 04/13/2022 0335   RBC 3.52 (L) 04/13/2022 0335   HGB 10.5 (L) 04/13/2022 0335   HCT 32.8 (L) 04/13/2022 0335   PLT 221 04/13/2022 0335   MCV 93.2 04/13/2022 0335   MCH 29.8 04/13/2022 0335   MCHC 32.0 04/13/2022 0335   RDW 13.1 04/13/2022 0335    No results found for: "POCLITH", "LITHIUM"   No results found for: "PHENYTOIN", "PHENOBARB", "VALPROATE", "CBMZ"   .res Assessment: Plan:    Plan:  PDMP reviewed  Xanax 0.25mg  TID - taking more often  Cymbalta 30mg  BID  Add Vraylar 1.5mg  daily - samples given.   Seeing therapist - Merry Lofty - last seen over a year ago.  RTC 6 months  Time spent with patient was 20 minutes. Greater than 50% of face to face time with patient was spent on counseling and coordination of care.   Patient advised to contact office with any questions, adverse effects, or acute worsening in signs and symptoms.  Diagnoses and all orders for  this visit:  Major depressive disorder, recurrent episode, moderate (HCC)  Generalized anxiety disorder  Insomnia, unspecified type     Please see After Visit Summary for patient specific instructions.  Future Appointments  Date Time Provider Department Center  09/30/2022 12:00 PM Saki Legore, Thereasa Solo, NP [facility] None    No orders of the defined types were placed in this encounter.   -------------------------------

## 2022-09-16 ENCOUNTER — Telehealth: Payer: Self-pay | Admitting: Adult Health

## 2022-09-16 DIAGNOSIS — M25552 Pain in left hip: Secondary | ICD-10-CM | POA: Diagnosis not present

## 2022-09-16 DIAGNOSIS — Z96642 Presence of left artificial hip joint: Secondary | ICD-10-CM | POA: Diagnosis not present

## 2022-09-16 DIAGNOSIS — R531 Weakness: Secondary | ICD-10-CM | POA: Diagnosis not present

## 2022-09-16 NOTE — Telephone Encounter (Signed)
Stacey Santana called to give an update on the Vraylar she was prescribed. She stated there is a slight improvement. She has less anxiety and negativity, but still no energy. No side effects experienced at this time.

## 2022-09-16 NOTE — Telephone Encounter (Signed)
Noted. Ty!

## 2022-09-16 NOTE — Telephone Encounter (Signed)
See message. Just an FYI.

## 2022-09-19 ENCOUNTER — Other Ambulatory Visit: Payer: Self-pay | Admitting: Adult Health

## 2022-09-19 DIAGNOSIS — G4700 Insomnia, unspecified: Secondary | ICD-10-CM

## 2022-09-19 DIAGNOSIS — R531 Weakness: Secondary | ICD-10-CM | POA: Diagnosis not present

## 2022-09-19 DIAGNOSIS — M25552 Pain in left hip: Secondary | ICD-10-CM | POA: Diagnosis not present

## 2022-09-19 DIAGNOSIS — M25511 Pain in right shoulder: Secondary | ICD-10-CM | POA: Diagnosis not present

## 2022-09-19 DIAGNOSIS — Z96612 Presence of left artificial shoulder joint: Secondary | ICD-10-CM | POA: Diagnosis not present

## 2022-09-19 DIAGNOSIS — Z96642 Presence of left artificial hip joint: Secondary | ICD-10-CM | POA: Diagnosis not present

## 2022-09-20 NOTE — Telephone Encounter (Signed)
LVM to [facility]. Not mentioned on last note, ? If she is still taking.

## 2022-09-23 DIAGNOSIS — M25552 Pain in left hip: Secondary | ICD-10-CM | POA: Diagnosis not present

## 2022-09-23 DIAGNOSIS — R531 Weakness: Secondary | ICD-10-CM | POA: Diagnosis not present

## 2022-09-23 DIAGNOSIS — Z96642 Presence of left artificial hip joint: Secondary | ICD-10-CM | POA: Diagnosis not present

## 2022-09-30 ENCOUNTER — Ambulatory Visit (INDEPENDENT_AMBULATORY_CARE_PROVIDER_SITE_OTHER): Payer: Medicare Other | Admitting: Adult Health

## 2022-09-30 ENCOUNTER — Encounter: Payer: Self-pay | Admitting: Adult Health

## 2022-09-30 DIAGNOSIS — F411 Generalized anxiety disorder: Secondary | ICD-10-CM

## 2022-09-30 DIAGNOSIS — G4700 Insomnia, unspecified: Secondary | ICD-10-CM

## 2022-09-30 DIAGNOSIS — F331 Major depressive disorder, recurrent, moderate: Secondary | ICD-10-CM | POA: Diagnosis not present

## 2022-09-30 MED ORDER — ZALEPLON 5 MG PO CAPS: ORAL_CAPSULE | ORAL | 2 refills | Status: DC

## 2022-09-30 MED ORDER — SERTRALINE HCL 50 MG PO TABS: 50.0000 mg | ORAL_TABLET | Freq: Every day | ORAL | 2 refills | Status: DC

## 2022-09-30 MED ORDER — CARIPRAZINE HCL 1.5 MG PO CAPS: 1.5000 mg | ORAL_CAPSULE | Freq: Every day | ORAL | 2 refills | Status: DC

## 2022-09-30 NOTE — Progress Notes (Signed)
Stacey Santana [id number] 1945-09-15 77 y.o.  Subjective:   Patient ID:  Stacey Santana is a 77 y.o. (DOB 01-04-1946) female.  Chief Complaint: No chief complaint on file.   HPI Stacey Santana presents to the office today for follow-up of GAD, insomnia and MDD.   Describes mood today as "improved". Pleasant. Reports tearfulness. Mood symptoms - reports depression, anxiety, and irritability - not feeling as heavy with addition of Vraylar. Reports decreased worry, rumination, and over thinking. Mood is a little bit better. Stating "I could tell a difference with the Vraylar on the first day". Would like to consider other medication changes. Decreased interest and motivation. Taking medications as prescribed.  Energy levels lower. Active, does not have a regular exercise routine. Enjoys some usual interests and activities. Lives alone. Legally separated for past 14 years. Spending time with family. Appetite adequate.. Weight stable - 187 pounds. Sleeps well most nights - taking Sonata 10mg  at bedtime. Averages 9 to 10 hours. Using CPAP machine. Focus and concentration difficulties. Completing tasks. Managing some aspects of household. Retired. Denies SI or HI.  Denies AH or VH. Denies substance use. Denies self harm.   Previous medication trials: Prozac - 10 years+, Zoloft - 10 years+, Depakote, Lamictal Clonazepam, Xanax, Effexor 15 years, Sonata, Wellbutrin, Rexulti, Abilify    Flowsheet Row Admission (Discharged) from 04/12/2022 in [place] [facility]  C-SSRS RISK CATEGORY No Risk        Review of Systems:  Review of Systems  Musculoskeletal:  Negative for gait problem.  Neurological:  Negative for tremors.  Psychiatric/Behavioral:         Please refer to HPI    Medications: I have reviewed the patient's current medications.  Current Outpatient Medications  Medication Sig Dispense Refill   ALPRAZolam (XANAX) 0.25 MG tablet TAKE 1 TABLET BY MOUTH THREE TIMES A  DAY AS NEEDED FOR ANXIETY 90 tablet 0   CALCIUM-MAGNESIUM-ZINC PO Take 1 tablet by mouth at bedtime.     Cholecalciferol (VITAMIN D3) 1000 units CAPS Take 1,000 Units by mouth daily.      DULoxetine (CYMBALTA) 30 MG capsule Take 1 capsule (30 mg total) by mouth 2 (two) times daily. 180 capsule 1   HYDROcodone-acetaminophen (NORCO/VICODIN) 5-325 MG tablet Take 1 tablet by mouth every 4 (four) hours as needed for severe pain. 42 tablet 0   losartan (COZAAR) 100 MG tablet Take 100 mg by mouth at bedtime.      Melatonin 10 MG TABS Take 10 mg by mouth at bedtime.     metFORMIN (GLUCOPHAGE-XR) 500 MG 24 hr tablet Take 500 mg by mouth 2 (two) times daily with a meal.     methocarbamol (ROBAXIN) 500 MG tablet Take 1 tablet (500 mg total) by mouth every 6 (six) hours as needed for muscle spasms (muscle pain). 40 tablet 2   Methylcobalamin 1000 MCG LOZG Take 1,000 mcg by mouth every Monday, Wednesday, and Friday.     metoprolol succinate (TOPROL-XL) 25 MG 24 hr tablet Take 25 mg by mouth daily.     Multiple Vitamins-Minerals (PRESERVISION AREDS 2) CAPS Take 1 capsule by mouth 2 (two) times daily.     MYRBETRIQ 50 MG TB24 tablet Take 50 mg by mouth daily.      omeprazole (PRILOSEC) 20 MG capsule Take 20 mg by mouth at bedtime.      OVER THE COUNTER MEDICATION Apply 1 Application topically daily as needed (pain). CBD cream     polyethylene glycol (MIRALAX / GLYCOLAX) 17  g packet Take 17 g by mouth 2 (two) times daily. 14 each 0   Probiotic Product (PROBIOTIC PO) Take 1 capsule by mouth daily.     simvastatin (ZOCOR) 20 MG tablet Take 20 mg by mouth at bedtime.      zaleplon (SONATA) 10 MG capsule TAKE 1 CAPSULE BY MOUTH EVERY DAY AT BEDTIME AS NEEDED 30 capsule 0   No current facility-administered medications for this visit.    Medication Side Effects: None  Allergies:  Allergies  Allergen Reactions   Hydroxyzine Other (See Comments)    Dizziness    Meloxicam Nausea And Vomiting         Oxycodone-Acetaminophen     Didn't work   Amoxicillin-Pot Clavulanate Diarrhea   Aripiprazole     ineffective, dizziness   Brexpiprazole     Ineffective   Bupropion Hcl Er (Xl)     ineffective   Clonazepam Other (See Comments)    dizziness   Darvon [Propoxyphene] Nausea And Vomiting   Escitalopram Oxalate     ineffective   Fluoxetine     ineffective after 10 yrs   Pollen Extract    Scopolamine Other (See Comments)    Psych. Disturbance ?   Sertraline Hcl     ineffective after 10 yrs   Viibryd [Vilazodone Hcl] Diarrhea    Past Medical History:  Diagnosis Date   Anxiety    Arthritis    some degenerative signs in other places of her body    Complication of anesthesia 1993   used scopolamine patch during surgery- manic episode occurred post op , met with the anesth. dept. afterwards & they thought that the episode was related to scop patch.    Depression    chronic    Diabetes mellitus without complication (HCC)    type II   GERD (gastroesophageal reflux disease)    High cholesterol    Hypertension    Peripheral vascular disease (HCC)    Sleep apnea    CPAP     Past Medical History, Surgical history, Social history, and Family history were reviewed and updated as appropriate.   Please see review of systems for further details on the patient's review from today.   Objective:   Physical Exam:  There were no vitals taken for this visit.  Physical Exam Constitutional:      General: She is not in acute distress. Musculoskeletal:        General: No deformity.  Neurological:     Mental Status: She is alert and oriented to person, place, and time.     Coordination: Coordination normal.  Psychiatric:        Attention and Perception: Attention and perception normal. She does not perceive auditory or visual hallucinations.        Mood and Affect: Mood normal. Mood is not anxious or depressed. Affect is not labile, blunt, angry or inappropriate.        Speech: Speech  normal.        Behavior: Behavior normal.        Thought Content: Thought content normal. Thought content is not paranoid or delusional. Thought content does not include homicidal or suicidal ideation. Thought content does not include homicidal or suicidal plan.        Cognition and Memory: Cognition and memory normal.        Judgment: Judgment normal.     Comments: Insight intact     Lab Review:     Component Value Date/Time  NA 139 04/13/2022 0335   K 4.0 04/13/2022 0335   CL 108 04/13/2022 0335   CO2 25 04/13/2022 0335   GLUCOSE 166 (H) 04/13/2022 0335   BUN 15 04/13/2022 0335   CREATININE 0.61 04/13/2022 0335   CALCIUM 8.7 (L) 04/13/2022 0335   GFRNONAA >60 04/13/2022 0335   GFRAA >60 05/18/2017 1305       Component Value Date/Time   WBC 12.1 (H) 04/13/2022 0335   RBC 3.52 (L) 04/13/2022 0335   HGB 10.5 (L) 04/13/2022 0335   HCT 32.8 (L) 04/13/2022 0335   PLT 221 04/13/2022 0335   MCV 93.2 04/13/2022 0335   MCH 29.8 04/13/2022 0335   MCHC 32.0 04/13/2022 0335   RDW 13.1 04/13/2022 0335    No results found for: "POCLITH", "LITHIUM"   No results found for: "PHENYTOIN", "PHENOBARB", "VALPROATE", "CBMZ"   .res Assessment: Plan:    Plan:  PDMP reviewed  Xanax 0.25mg  TID as needed Cymbalta 30mg  BID   Add Zoloft 50mg  daily -would like to try again - helpful for 10 years  Decrease Sonata 10mg  to 5mg  at hs.  Vraylar 1.5mg  daily script sent - samples given x 4 weeks.   Seeing therapist - Merry Lofty - last seen over a year ago.  RTC 4 weeks  Time spent with patient was 20 minutes. Greater than 50% of face to face time with patient was spent on counseling and coordination of care.   Patient advised to contact office with any questions, adverse effects, or acute worsening in signs and symptoms. There are no diagnoses linked to this encounter.   Please see After Visit Summary for patient specific instructions.  No future appointments.  No orders of the  defined types were placed in this encounter.   -------------------------------

## 2022-10-04 DIAGNOSIS — Z961 Presence of intraocular lens: Secondary | ICD-10-CM | POA: Diagnosis not present

## 2022-10-04 DIAGNOSIS — H04123 Dry eye syndrome of bilateral lacrimal glands: Secondary | ICD-10-CM | POA: Diagnosis not present

## 2022-10-05 ENCOUNTER — Ambulatory Visit: Payer: Medicare Other | Admitting: Adult Health

## 2022-10-17 DIAGNOSIS — C44719 Basal cell carcinoma of skin of left lower limb, including hip: Secondary | ICD-10-CM | POA: Diagnosis not present

## 2022-10-17 DIAGNOSIS — L814 Other melanin hyperpigmentation: Secondary | ICD-10-CM | POA: Diagnosis not present

## 2022-10-17 DIAGNOSIS — D1801 Hemangioma of skin and subcutaneous tissue: Secondary | ICD-10-CM | POA: Diagnosis not present

## 2022-10-17 DIAGNOSIS — Z85828 Personal history of other malignant neoplasm of skin: Secondary | ICD-10-CM | POA: Diagnosis not present

## 2022-10-17 DIAGNOSIS — L570 Actinic keratosis: Secondary | ICD-10-CM | POA: Diagnosis not present

## 2022-10-17 DIAGNOSIS — L565 Disseminated superficial actinic porokeratosis (DSAP): Secondary | ICD-10-CM | POA: Diagnosis not present

## 2022-10-17 DIAGNOSIS — L821 Other seborrheic keratosis: Secondary | ICD-10-CM | POA: Diagnosis not present

## 2022-10-18 ENCOUNTER — Telehealth: Payer: Self-pay | Admitting: Adult Health

## 2022-10-18 NOTE — Telephone Encounter (Signed)
She would just try leaving it off and see how she feels.

## 2022-10-18 NOTE — Telephone Encounter (Signed)
Pt lvm that she is weaning off of the cymbalta. She needs a new script sent in. She doesn't want 180 pills sent in. Please call her at [phone]

## 2022-10-18 NOTE — Telephone Encounter (Signed)
Patient has an appt with you on Friday. She has been weaning the Cymbalta 30 mg because she said it wasn't working. You started her on Zoloft at last visit. Cymbalta was prescribed at 30 mg BID. She said for the past 2 weeks she has been taking 30 mg QAM only. She is asking how to wean from here.

## 2022-10-19 NOTE — Telephone Encounter (Signed)
Patient notified

## 2022-10-21 ENCOUNTER — Encounter: Payer: Self-pay | Admitting: Adult Health

## 2022-10-21 ENCOUNTER — Ambulatory Visit (INDEPENDENT_AMBULATORY_CARE_PROVIDER_SITE_OTHER): Payer: Medicare Other | Admitting: Adult Health

## 2022-10-21 DIAGNOSIS — F331 Major depressive disorder, recurrent, moderate: Secondary | ICD-10-CM | POA: Diagnosis not present

## 2022-10-21 DIAGNOSIS — G4700 Insomnia, unspecified: Secondary | ICD-10-CM | POA: Diagnosis not present

## 2022-10-21 DIAGNOSIS — F411 Generalized anxiety disorder: Secondary | ICD-10-CM | POA: Diagnosis not present

## 2022-10-21 MED ORDER — SERTRALINE HCL 100 MG PO TABS: 50.0000 mg | ORAL_TABLET | Freq: Every day | ORAL | 2 refills | Status: DC

## 2022-10-21 MED ORDER — ALPRAZOLAM 0.25 MG PO TABS: ORAL_TABLET | ORAL | 0 refills | Status: DC

## 2022-10-21 NOTE — Progress Notes (Addendum)
Stacey Santana [id number] February 03, 1946 77 y.o.  Subjective:   Patient ID:  Brent Noto is a 77 y.o. (DOB Jan 10, 1946) female.  Chief Complaint: No chief complaint on file.   HPI Rebekah Zackery presents to the office today for follow-up of GAD, insomnia and MDD.   Describes mood today as "improved". Pleasant. Reports decreased tearfulness. Mood symptoms - reports depression, anxiety, and irritability - "not as bad". Reports decreased worry, rumination, and over thinking. Mood has improved. Stating "I know my mind is doing better". Has restarted the Zoloft and is currently taking 100mg  daily. Improved interest and motivation. Taking medications as prescribed.  Energy levels lower - "no energy". Active, has a regular exercise routine. Enjoys some usual interests and activities. Lives alone. Legally separated for past 14 years. Spending time with family. Appetite adequate. Weight stable - 187 pounds. Sleeps well most nights - taking Sonata 5mg  at bedtime. Averages 9 to 10 hours. Using CPAP machine. Focus and concentration improved. Completing tasks. Managing some aspects of household. Retired. Denies SI or HI.  Denies AH or VH. Denies substance use. Denies self harm.   Previous medication trials: Prozac - 10 years+, Zoloft - 10 years+, Depakote, Lamictal Clonazepam, Xanax, Effexor 15 years, Sonata, Wellbutrin, Rexulti, Abilify    Flowsheet Row Admission (Discharged) from 04/12/2022 in [facility]  C-SSRS RISK CATEGORY No Risk        Review of Systems:  Review of Systems  Musculoskeletal:  Negative for gait problem.  Neurological:  Negative for tremors.  Psychiatric/Behavioral:         Please refer to HPI    Medications: I have reviewed the patient's current medications.  Current Outpatient Medications  Medication Sig Dispense Refill   ALPRAZolam (XANAX) 0.25 MG tablet TAKE 1 TABLET BY MOUTH THREE TIMES A DAY AS NEEDED FOR ANXIETY 90 tablet 0    CALCIUM-MAGNESIUM-ZINC PO Take 1 tablet by mouth at bedtime.     cariprazine (VRAYLAR) 1.5 MG capsule Take 1 capsule (1.5 mg total) by mouth daily. 30 capsule 2   Cholecalciferol (VITAMIN D3) 1000 units CAPS Take 1,000 Units by mouth daily.      DULoxetine (CYMBALTA) 30 MG capsule Take 1 capsule (30 mg total) by mouth 2 (two) times daily. 180 capsule 1   HYDROcodone-acetaminophen (NORCO/VICODIN) 5-325 MG tablet Take 1 tablet by mouth every 4 (four) hours as needed for severe pain. 42 tablet 0   losartan (COZAAR) 100 MG tablet Take 100 mg by mouth at bedtime.      Melatonin 10 MG TABS Take 10 mg by mouth at bedtime.     metFORMIN (GLUCOPHAGE-XR) 500 MG 24 hr tablet Take 500 mg by mouth 2 (two) times daily with a meal.     methocarbamol (ROBAXIN) 500 MG tablet Take 1 tablet (500 mg total) by mouth every 6 (six) hours as needed for muscle spasms (muscle pain). 40 tablet 2   Methylcobalamin 1000 MCG LOZG Take 1,000 mcg by mouth every Monday, Wednesday, and Friday.     metoprolol succinate (TOPROL-XL) 25 MG 24 hr tablet Take 25 mg by mouth daily.     Multiple Vitamins-Minerals (PRESERVISION AREDS 2) CAPS Take 1 capsule by mouth 2 (two) times daily.     MYRBETRIQ 50 MG TB24 tablet Take 50 mg by mouth daily.      omeprazole (PRILOSEC) 20 MG capsule Take 20 mg by mouth at bedtime.      OVER THE COUNTER MEDICATION Apply 1 Application topically daily as needed (pain). CBD  cream     polyethylene glycol (MIRALAX / GLYCOLAX) 17 g packet Take 17 g by mouth 2 (two) times daily. 14 each 0   Probiotic Product (PROBIOTIC PO) Take 1 capsule by mouth daily.     sertraline (ZOLOFT) 100 MG tablet Take 0.5 tablets (50 mg total) by mouth daily. 30 tablet 2   simvastatin (ZOCOR) 20 MG tablet Take 20 mg by mouth at bedtime.      zaleplon (SONATA) 5 MG capsule TAKE 1 CAPSULE BY MOUTH EVERY DAY AT BEDTIME AS NEEDED 30 capsule 2   No current facility-administered medications for this visit.    Medication Side Effects:  None  Allergies:  Allergies  Allergen Reactions   Hydroxyzine Other (See Comments)    Dizziness    Meloxicam Nausea And Vomiting        Oxycodone-Acetaminophen     Didn't work   Amoxicillin-Pot Clavulanate Diarrhea   Aripiprazole     ineffective, dizziness   Brexpiprazole     Ineffective   Bupropion Hcl Er (Xl)     ineffective   Clonazepam Other (See Comments)    dizziness   Darvon [Propoxyphene] Nausea And Vomiting   Escitalopram Oxalate     ineffective   Fluoxetine     ineffective after 10 yrs   Pollen Extract    Scopolamine Other (See Comments)    Psych. Disturbance ?   Sertraline Hcl     ineffective after 10 yrs   Viibryd [Vilazodone Hcl] Diarrhea    Past Medical History:  Diagnosis Date   Anxiety    Arthritis    some degenerative signs in other places of her body    Complication of anesthesia 1993   used scopolamine patch during surgery- manic episode occurred post op , met with the anesth. dept. afterwards & they thought that the episode was related to scop patch.    Depression    chronic    Diabetes mellitus without complication (HCC)    type II   GERD (gastroesophageal reflux disease)    High cholesterol    Hypertension    Peripheral vascular disease (HCC)    Sleep apnea    CPAP     Past Medical History, Surgical history, Social history, and Family history were reviewed and updated as appropriate.   Please see review of systems for further details on the patient's review from today.   Objective:   Physical Exam:  There were no vitals taken for this visit.  Physical Exam Constitutional:      General: She is not in acute distress. Musculoskeletal:        General: No deformity.  Neurological:     Mental Status: She is alert and oriented to person, place, and time.     Coordination: Coordination normal.  Psychiatric:        Attention and Perception: Attention and perception normal. She does not perceive auditory or visual hallucinations.         Mood and Affect: Mood normal. Mood is not anxious or depressed. Affect is not labile, blunt, angry or inappropriate.        Speech: Speech normal.        Behavior: Behavior normal.        Thought Content: Thought content normal. Thought content is not paranoid or delusional. Thought content does not include homicidal or suicidal ideation. Thought content does not include homicidal or suicidal plan.        Cognition and Memory: Cognition and memory normal.  Judgment: Judgment normal.     Comments: Insight intact     Lab Review:     Component Value Date/Time   NA 139 04/13/2022 0335   K 4.0 04/13/2022 0335   CL 108 04/13/2022 0335   CO2 25 04/13/2022 0335   GLUCOSE 166 (H) 04/13/2022 0335   BUN 15 04/13/2022 0335   CREATININE 0.61 04/13/2022 0335   CALCIUM 8.7 (L) 04/13/2022 0335   GFRNONAA >60 04/13/2022 0335   GFRAA >60 05/18/2017 1305       Component Value Date/Time   WBC 12.1 (H) 04/13/2022 0335   RBC 3.52 (L) 04/13/2022 0335   HGB 10.5 (L) 04/13/2022 0335   HCT 32.8 (L) 04/13/2022 0335   PLT 221 04/13/2022 0335   MCV 93.2 04/13/2022 0335   MCH 29.8 04/13/2022 0335   MCHC 32.0 04/13/2022 0335   RDW 13.1 04/13/2022 0335    No results found for: "POCLITH", "LITHIUM"   No results found for: "PHENYTOIN", "PHENOBARB", "VALPROATE", "CBMZ"   .res Assessment: Plan:   Plan:  PDMP reviewed  Xanax 0.25mg  TID as needed Cymbalta 30mg  BID   Zoloft 100mg  daily  Sonata 5mg  at hs.  Vraylar 1.5mg  daily script sent - samples given x 4 weeks.   Seeing therapist - Merry Lofty - last seen over a year ago.  RTC 4 weeks  Time spent with patient was 20 minutes. Greater than 50% of face to face time with patient was spent on counseling and coordination of care.   Patient advised to contact office with any questions, adverse effects, or acute worsening in signs and symptoms. Diagnoses and all orders for this visit:  Major depressive disorder, recurrent episode,  moderate (HCC) -     sertraline (ZOLOFT) 100 MG tablet; Take 0.5 tablets (50 mg total) by mouth daily.  Generalized anxiety disorder -     sertraline (ZOLOFT) 100 MG tablet; Take 0.5 tablets (50 mg total) by mouth daily. -     ALPRAZolam (XANAX) 0.25 MG tablet; TAKE 1 TABLET BY MOUTH THREE TIMES A DAY AS NEEDED FOR ANXIETY  Insomnia, unspecified type -     ALPRAZolam (XANAX) 0.25 MG tablet; TAKE 1 TABLET BY MOUTH THREE TIMES A DAY AS NEEDED FOR ANXIETY     Please see After Visit Summary for patient specific instructions.  No future appointments.   No orders of the defined types were placed in this encounter.   -------------------------------

## 2022-11-07 ENCOUNTER — Telehealth: Payer: Self-pay | Admitting: Adult Health

## 2022-11-07 DIAGNOSIS — F331 Major depressive disorder, recurrent, moderate: Secondary | ICD-10-CM

## 2022-11-07 DIAGNOSIS — F411 Generalized anxiety disorder: Secondary | ICD-10-CM

## 2022-11-07 NOTE — Telephone Encounter (Signed)
Pt LVM @ 4:49p requesting Sertraline script to say 100mg  pill once a day instead of 50mg  once a day.  Pls send to   [id number] [place], [facility] - [address], [place] [address] Phone: [phone]  Fax: [phone]   Next appt 8/2

## 2022-11-08 MED ORDER — SERTRALINE HCL 100 MG PO TABS: 100.0000 mg | ORAL_TABLET | Freq: Every day | ORAL | 0 refills | Status: DC

## 2022-12-02 ENCOUNTER — Encounter: Payer: Self-pay | Admitting: Adult Health

## 2022-12-02 ENCOUNTER — Ambulatory Visit (INDEPENDENT_AMBULATORY_CARE_PROVIDER_SITE_OTHER): Payer: Medicare Other | Admitting: Adult Health

## 2022-12-02 DIAGNOSIS — F331 Major depressive disorder, recurrent, moderate: Secondary | ICD-10-CM

## 2022-12-02 DIAGNOSIS — F411 Generalized anxiety disorder: Secondary | ICD-10-CM

## 2022-12-02 DIAGNOSIS — G4700 Insomnia, unspecified: Secondary | ICD-10-CM | POA: Diagnosis not present

## 2022-12-02 MED ORDER — SERTRALINE HCL 100 MG PO TABS: 100.0000 mg | ORAL_TABLET | Freq: Every day | ORAL | 3 refills | Status: DC

## 2022-12-02 MED ORDER — ZALEPLON 5 MG PO CAPS: ORAL_CAPSULE | ORAL | 2 refills | Status: DC

## 2022-12-02 NOTE — Progress Notes (Signed)
Stacey Santana [id number] Aug 13, 1945 77 y.o.  Subjective:   Patient ID:  Stacey Santana is a 77 y.o. (DOB 03-Jul-1945) female.  Chief Complaint: No chief complaint on file.   HPI Stacey Santana presents to the office today for follow-up of GAD, insomnia and MDD.   Describes mood today as "improved". Pleasant. Reports decreased tearfulness. Mood symptoms - reports decreased depression, anxiety, and irritability. Denies worry, rumination, and over thinking. Mood has improved. Denies panic attacks. Mood is consistent. Stating "I feel like I'm doing better". Feels like medications are helpful. Improved interest and motivation. Taking medications as prescribed.  Energy levels improved. Active, has a regular exercise routine. Enjoys some usual interests and activities. Lives alone. Legally separated for past 14 years. Spending time with family. Appetite adequate. Weight stable - 187 to 190's pounds. Sleeps well most nights - taking Sonata 5mg  at bedtime. Averages 9 to 10 hours. Using CPAP machine. Focus and concentration stable. Completing tasks. Managing some aspects of household. Retired. Denies SI or HI.  Denies AH or VH. Denies substance use. Denies self harm.   Previous medication trials: Prozac - 10 years+, Zoloft - 10 years+, Depakote, Lamictal Clonazepam, Xanax, Effexor 15 years, Sonata, Wellbutrin, Rexulti, Abilify    Flowsheet Row Admission (Discharged) from 04/12/2022 in [facility]  C-SSRS RISK CATEGORY No Risk        Review of Systems:  Review of Systems  Musculoskeletal:  Negative for gait problem.  Neurological:  Negative for tremors.  Psychiatric/Behavioral:         Please refer to HPI    Medications: I have reviewed the patient's current medications.  Current Outpatient Medications  Medication Sig Dispense Refill   ALPRAZolam (XANAX) 0.25 MG tablet TAKE 1 TABLET BY MOUTH THREE TIMES A DAY AS NEEDED FOR ANXIETY 90 tablet 0    CALCIUM-MAGNESIUM-ZINC PO Take 1 tablet by mouth at bedtime.     cariprazine (VRAYLAR) 1.5 MG capsule Take 1 capsule (1.5 mg total) by mouth daily. 30 capsule 2   Cholecalciferol (VITAMIN D3) 1000 units CAPS Take 1,000 Units by mouth daily.      DULoxetine (CYMBALTA) 30 MG capsule Take 1 capsule (30 mg total) by mouth 2 (two) times daily. 180 capsule 1   HYDROcodone-acetaminophen (NORCO/VICODIN) 5-325 MG tablet Take 1 tablet by mouth every 4 (four) hours as needed for severe pain. 42 tablet 0   losartan (COZAAR) 100 MG tablet Take 100 mg by mouth at bedtime.      Melatonin 10 MG TABS Take 10 mg by mouth at bedtime.     metFORMIN (GLUCOPHAGE-XR) 500 MG 24 hr tablet Take 500 mg by mouth 2 (two) times daily with a meal.     methocarbamol (ROBAXIN) 500 MG tablet Take 1 tablet (500 mg total) by mouth every 6 (six) hours as needed for muscle spasms (muscle pain). 40 tablet 2   Methylcobalamin 1000 MCG LOZG Take 1,000 mcg by mouth every Monday, Wednesday, and Friday.     metoprolol succinate (TOPROL-XL) 25 MG 24 hr tablet Take 25 mg by mouth daily.     Multiple Vitamins-Minerals (PRESERVISION AREDS 2) CAPS Take 1 capsule by mouth 2 (two) times daily.     MYRBETRIQ 50 MG TB24 tablet Take 50 mg by mouth daily.      omeprazole (PRILOSEC) 20 MG capsule Take 20 mg by mouth at bedtime.      OVER THE COUNTER MEDICATION Apply 1 Application topically daily as needed (pain). CBD cream  polyethylene glycol (MIRALAX / GLYCOLAX) 17 g packet Take 17 g by mouth 2 (two) times daily. 14 each 0   Probiotic Product (PROBIOTIC PO) Take 1 capsule by mouth daily.     sertraline (ZOLOFT) 100 MG tablet Take 1 tablet (100 mg total) by mouth daily. 30 tablet 0   simvastatin (ZOCOR) 20 MG tablet Take 20 mg by mouth at bedtime.      zaleplon (SONATA) 5 MG capsule TAKE 1 CAPSULE BY MOUTH EVERY DAY AT BEDTIME AS NEEDED 30 capsule 2   No current facility-administered medications for this visit.    Medication Side Effects:  None  Allergies:  Allergies  Allergen Reactions   Hydroxyzine Other (See Comments)    Dizziness    Meloxicam Nausea And Vomiting        Oxycodone-Acetaminophen     Didn't work   Amoxicillin-Pot Clavulanate Diarrhea   Aripiprazole     ineffective, dizziness   Brexpiprazole     Ineffective   Bupropion Hcl Er (Xl)     ineffective   Clonazepam Other (See Comments)    dizziness   Darvon [Propoxyphene] Nausea And Vomiting   Escitalopram Oxalate     ineffective   Fluoxetine     ineffective after 10 yrs   Pollen Extract    Scopolamine Other (See Comments)    Psych. Disturbance ?   Sertraline Hcl     ineffective after 10 yrs   Viibryd [Vilazodone Hcl] Diarrhea    Past Medical History:  Diagnosis Date   Anxiety    Arthritis    some degenerative signs in other places of her body    Complication of anesthesia 1993   used scopolamine patch during surgery- manic episode occurred post op , met with the anesth. dept. afterwards & they thought that the episode was related to scop patch.    Depression    chronic    Diabetes mellitus without complication (HCC)    type II   GERD (gastroesophageal reflux disease)    High cholesterol    Hypertension    Peripheral vascular disease (HCC)    Sleep apnea    CPAP     Past Medical History, Surgical history, Social history, and Family history were reviewed and updated as appropriate.   Please see review of systems for further details on the patient's review from today.   Objective:   Physical Exam:  There were no vitals taken for this visit.  Physical Exam Constitutional:      General: She is not in acute distress. Musculoskeletal:        General: No deformity.  Neurological:     Mental Status: She is alert and oriented to person, place, and time.     Coordination: Coordination normal.  Psychiatric:        Attention and Perception: Attention and perception normal. She does not perceive auditory or visual hallucinations.         Mood and Affect: Affect is not labile, blunt, angry or inappropriate.        Speech: Speech normal.        Behavior: Behavior normal.        Thought Content: Thought content normal. Thought content is not paranoid or delusional. Thought content does not include homicidal or suicidal ideation. Thought content does not include homicidal or suicidal plan.        Cognition and Memory: Cognition and memory normal.        Judgment: Judgment normal.  Comments: Insight intact     Lab Review:     Component Value Date/Time   NA 139 04/13/2022 0335   K 4.0 04/13/2022 0335   CL 108 04/13/2022 0335   CO2 25 04/13/2022 0335   GLUCOSE 166 (H) 04/13/2022 0335   BUN 15 04/13/2022 0335   CREATININE 0.61 04/13/2022 0335   CALCIUM 8.7 (L) 04/13/2022 0335   GFRNONAA >60 04/13/2022 0335   GFRAA >60 05/18/2017 1305       Component Value Date/Time   WBC 12.1 (H) 04/13/2022 0335   RBC 3.52 (L) 04/13/2022 0335   HGB 10.5 (L) 04/13/2022 0335   HCT 32.8 (L) 04/13/2022 0335   PLT 221 04/13/2022 0335   MCV 93.2 04/13/2022 0335   MCH 29.8 04/13/2022 0335   MCHC 32.0 04/13/2022 0335   RDW 13.1 04/13/2022 0335    No results found for: "POCLITH", "LITHIUM"   No results found for: "PHENYTOIN", "PHENOBARB", "VALPROATE", "CBMZ"   .res Assessment: Plan:    Plan:  PDMP reviewed  Xanax 0.25mg  TID as needed  Zoloft 100mg  daily  Sonata 5mg  at hs.  Vraylar 1.5mg  daily script sent - samples given x 4 weeks.   Seeing therapist - Merry Lofty - last seen over a year ago.  RTC 4 weeks  Time spent with patient was 20 minutes. Greater than 50% of face to face time with patient was spent on counseling and coordination of care.   Patient advised to contact office with any questions, adverse effects, or acute worsening in signs and symptoms.  There are no diagnoses linked to this encounter.   Please see After Visit Summary for patient specific instructions.  Future Appointments  Date Time  Provider Department Center  12/02/2022 11:40 AM , Thereasa Solo, NP [facility] None    No orders of the defined types were placed in this encounter.   -------------------------------

## 2023-01-03 DIAGNOSIS — M9901 Segmental and somatic dysfunction of cervical region: Secondary | ICD-10-CM | POA: Diagnosis not present

## 2023-01-03 DIAGNOSIS — M9903 Segmental and somatic dysfunction of lumbar region: Secondary | ICD-10-CM | POA: Diagnosis not present

## 2023-01-03 DIAGNOSIS — M4802 Spinal stenosis, cervical region: Secondary | ICD-10-CM | POA: Diagnosis not present

## 2023-01-03 DIAGNOSIS — M48061 Spinal stenosis, lumbar region without neurogenic claudication: Secondary | ICD-10-CM | POA: Diagnosis not present

## 2023-01-04 DIAGNOSIS — M9903 Segmental and somatic dysfunction of lumbar region: Secondary | ICD-10-CM | POA: Diagnosis not present

## 2023-01-04 DIAGNOSIS — M4802 Spinal stenosis, cervical region: Secondary | ICD-10-CM | POA: Diagnosis not present

## 2023-01-04 DIAGNOSIS — M9901 Segmental and somatic dysfunction of cervical region: Secondary | ICD-10-CM | POA: Diagnosis not present

## 2023-01-04 DIAGNOSIS — M48061 Spinal stenosis, lumbar region without neurogenic claudication: Secondary | ICD-10-CM | POA: Diagnosis not present

## 2023-01-05 DIAGNOSIS — M48061 Spinal stenosis, lumbar region without neurogenic claudication: Secondary | ICD-10-CM | POA: Diagnosis not present

## 2023-01-05 DIAGNOSIS — M9903 Segmental and somatic dysfunction of lumbar region: Secondary | ICD-10-CM | POA: Diagnosis not present

## 2023-01-05 DIAGNOSIS — M4802 Spinal stenosis, cervical region: Secondary | ICD-10-CM | POA: Diagnosis not present

## 2023-01-05 DIAGNOSIS — M9901 Segmental and somatic dysfunction of cervical region: Secondary | ICD-10-CM | POA: Diagnosis not present

## 2023-01-10 DIAGNOSIS — Z23 Encounter for immunization: Secondary | ICD-10-CM | POA: Diagnosis not present

## 2023-01-11 ENCOUNTER — Ambulatory Visit (INDEPENDENT_AMBULATORY_CARE_PROVIDER_SITE_OTHER): Payer: Medicare Other | Admitting: Adult Health

## 2023-01-11 ENCOUNTER — Encounter: Payer: Self-pay | Admitting: Adult Health

## 2023-01-11 DIAGNOSIS — F411 Generalized anxiety disorder: Secondary | ICD-10-CM

## 2023-01-11 DIAGNOSIS — G4700 Insomnia, unspecified: Secondary | ICD-10-CM | POA: Diagnosis not present

## 2023-01-11 DIAGNOSIS — F331 Major depressive disorder, recurrent, moderate: Secondary | ICD-10-CM

## 2023-01-11 MED ORDER — SERTRALINE HCL 100 MG PO TABS
100.0000 mg | ORAL_TABLET | Freq: Every day | ORAL | 3 refills | Status: DC
Start: 2023-01-11 — End: 2023-03-15

## 2023-01-11 MED ORDER — ALPRAZOLAM 0.25 MG PO TABS: ORAL_TABLET | ORAL | 2 refills | Status: DC

## 2023-01-11 NOTE — Progress Notes (Signed)
Stacey Santana [id number] [birth year] 77 y.o.  Subjective:   Patient ID:  Stacey Santana is a 77 y.o. (DOB Nov 05, 1945) female.  Chief Complaint: No chief complaint on file.   HPI Stacey Santana presents to the office today for follow-up of GAD, insomnia and MDD.   Describes mood today as "ok". Pleasant. Reports decreased tearfulness. Mood symptoms - denies   depression, anxiety, and irritability. Denies panic attacks. Denies worry, rumination, and over thinking.   Mood is consistent. Stating "I feel like I'm doing alright". Feels like medications are helpful. Improved interest and motivation. Taking medications as prescribed.  Energy levels are not the greatest. Active, has a regular exercise routine. Enjoys some usual interests and activities. Lives alone. Legally separated for past 14 years. Spending time with family. Appetite adequate. Weight gain - 195 pounds. Sleeps well most nights - some issues getting to sleep - taking Sonata 5mg  at bedtime. Averages 9 to 10 hours. Using CPAP machine. Focus and concentration stable. Completing tasks. Managing some aspects of household. Retired. Denies SI or HI.  Denies AH or VH. Denies substance use. Denies self harm.   Previous medication trials: Prozac - 10 years+, Zoloft - 10 years+, Depakote, Lamictal Clonazepam, Xanax, Effexor 15 years, Sonata, Wellbutrin, Rexulti, Abilify   Flowsheet Row Admission (Discharged) from 04/12/2022 in [facility]  C-SSRS RISK CATEGORY No Risk        Review of Systems:  Review of Systems  Musculoskeletal:  Negative for gait problem.  Neurological:  Negative for tremors.  Psychiatric/Behavioral:         Please refer to HPI    Medications: I have reviewed the patient's current medications.  Current Outpatient Medications  Medication Sig Dispense Refill   ALPRAZolam (XANAX) 0.25 MG tablet TAKE 1 TABLET BY MOUTH THREE TIMES A DAY AS NEEDED FOR ANXIETY 90 tablet 2    CALCIUM-MAGNESIUM-ZINC PO Take 1 tablet by mouth at bedtime.     cariprazine (VRAYLAR) 1.5 MG capsule Take 1 capsule (1.5 mg total) by mouth daily. 30 capsule 2   Cholecalciferol (VITAMIN D3) 1000 units CAPS Take 1,000 Units by mouth daily.      HYDROcodone-acetaminophen (NORCO/VICODIN) 5-325 MG tablet Take 1 tablet by mouth every 4 (four) hours as needed for severe pain. 42 tablet 0   losartan (COZAAR) 100 MG tablet Take 100 mg by mouth at bedtime.      Melatonin 10 MG TABS Take 10 mg by mouth at bedtime.     metFORMIN (GLUCOPHAGE-XR) 500 MG 24 hr tablet Take 500 mg by mouth 2 (two) times daily with a meal.     methocarbamol (ROBAXIN) 500 MG tablet Take 1 tablet (500 mg total) by mouth every 6 (six) hours as needed for muscle spasms (muscle pain). 40 tablet 2   Methylcobalamin 1000 MCG LOZG Take 1,000 mcg by mouth every Monday, Wednesday, and Friday.     metoprolol succinate (TOPROL-XL) 25 MG 24 hr tablet Take 25 mg by mouth daily.     Multiple Vitamins-Minerals (PRESERVISION AREDS 2) CAPS Take 1 capsule by mouth 2 (two) times daily.     MYRBETRIQ 50 MG TB24 tablet Take 50 mg by mouth daily.      omeprazole (PRILOSEC) 20 MG capsule Take 20 mg by mouth at bedtime.      OVER THE COUNTER MEDICATION Apply 1 Application topically daily as needed (pain). CBD cream     polyethylene glycol (MIRALAX / GLYCOLAX) 17 g packet Take 17 g by mouth 2 (two) times  daily. 14 each 0   Probiotic Product (PROBIOTIC PO) Take 1 capsule by mouth daily.     sertraline (ZOLOFT) 100 MG tablet Take 1 tablet (100 mg total) by mouth daily. 90 tablet 3   simvastatin (ZOCOR) 20 MG tablet Take 20 mg by mouth at bedtime.      zaleplon (SONATA) 5 MG capsule TAKE 1 CAPSULE BY MOUTH EVERY DAY AT BEDTIME AS NEEDED 30 capsule 2   No current facility-administered medications for this visit.    Medication Side Effects: None  Allergies:  Allergies  Allergen Reactions   Hydroxyzine Other (See Comments)    Dizziness    Meloxicam  Nausea And Vomiting        Oxycodone-Acetaminophen     Didn't work   Amoxicillin-Pot Clavulanate Diarrhea   Aripiprazole     ineffective, dizziness   Brexpiprazole     Ineffective   Bupropion Hcl Er (Xl)     ineffective   Clonazepam Other (See Comments)    dizziness   Darvon [Propoxyphene] Nausea And Vomiting   Escitalopram Oxalate     ineffective   Fluoxetine     ineffective after 10 yrs   Pollen Extract    Scopolamine Other (See Comments)    Psych. Disturbance ?   Sertraline Hcl     ineffective after 10 yrs   Viibryd [Vilazodone Hcl] Diarrhea    Past Medical History:  Diagnosis Date   Anxiety    Arthritis    some degenerative signs in other places of her body    Complication of anesthesia 1993   used scopolamine patch during surgery- manic episode occurred post op , met with the anesth. dept. afterwards & they thought that the episode was related to scop patch.    Depression    chronic    Diabetes mellitus without complication (HCC)    type II   GERD (gastroesophageal reflux disease)    High cholesterol    Hypertension    Peripheral vascular disease (HCC)    Sleep apnea    CPAP     Past Medical History, Surgical history, Social history, and Family history were reviewed and updated as appropriate.   Please see review of systems for further details on the patient's review from today.   Objective:   Physical Exam:  There were no vitals taken for this visit.  Physical Exam Constitutional:      General: She is not in acute distress. Musculoskeletal:        General: No deformity.  Neurological:     Mental Status: She is alert and oriented to person, place, and time.     Coordination: Coordination normal.  Psychiatric:        Attention and Perception: Attention and perception normal. She does not perceive auditory or visual hallucinations.        Mood and Affect: Affect is not labile, blunt, angry or inappropriate.        Speech: Speech normal.         Behavior: Behavior normal.        Thought Content: Thought content normal. Thought content is not paranoid or delusional. Thought content does not include homicidal or suicidal ideation. Thought content does not include homicidal or suicidal plan.        Cognition and Memory: Cognition and memory normal.        Judgment: Judgment normal.     Comments: Insight intact     Lab Review:     Component Value Date/Time  NA 139 04/13/2022 0335   K 4.0 04/13/2022 0335   CL 108 04/13/2022 0335   CO2 25 04/13/2022 0335   GLUCOSE 166 (H) 04/13/2022 0335   BUN 15 04/13/2022 0335   CREATININE 0.61 04/13/2022 0335   CALCIUM 8.7 (L) 04/13/2022 0335   GFRNONAA >60 04/13/2022 0335   GFRAA >60 05/18/2017 1305       Component Value Date/Time   WBC 12.1 (H) 04/13/2022 0335   RBC 3.52 (L) 04/13/2022 0335   HGB 10.5 (L) 04/13/2022 0335   HCT 32.8 (L) 04/13/2022 0335   PLT 221 04/13/2022 0335   MCV 93.2 04/13/2022 0335   MCH 29.8 04/13/2022 0335   MCHC 32.0 04/13/2022 0335   RDW 13.1 04/13/2022 0335    No results found for: "POCLITH", "LITHIUM"   No results found for: "PHENYTOIN", "PHENOBARB", "VALPROATE", "CBMZ"   .res Assessment: Plan:    Plan:  PDMP reviewed  Xanax 0.25mg  TID as needed  Zoloft 100mg  daily  Sonata 5mg  at hs.  Vraylar 1.5mg  daily script sent - samples given x 4 weeks.   Seeing therapist - Merry Lofty - last seen over a year ago.  RTC 4 weeks  Time spent with patient was 20 minutes. Greater than 50% of face to face time with patient was spent on counseling and coordination of care.   Patient advised to contact office with any questions, adverse effects, or acute worsening in signs and symptoms.   Diagnoses and all orders for this visit:  Generalized anxiety disorder -     ALPRAZolam (XANAX) 0.25 MG tablet; TAKE 1 TABLET BY MOUTH THREE TIMES A DAY AS NEEDED FOR ANXIETY -     sertraline (ZOLOFT) 100 MG tablet; Take 1 tablet (100 mg total) by mouth  daily.  Insomnia, unspecified type -     ALPRAZolam (XANAX) 0.25 MG tablet; TAKE 1 TABLET BY MOUTH THREE TIMES A DAY AS NEEDED FOR ANXIETY  Major depressive disorder, recurrent episode, moderate (HCC) -     sertraline (ZOLOFT) 100 MG tablet; Take 1 tablet (100 mg total) by mouth daily.     Please see After Visit Summary for patient specific instructions.  No future appointments.   No orders of the defined types were placed in this encounter.   -------------------------------

## 2023-01-12 DIAGNOSIS — M4802 Spinal stenosis, cervical region: Secondary | ICD-10-CM | POA: Diagnosis not present

## 2023-01-12 DIAGNOSIS — M9901 Segmental and somatic dysfunction of cervical region: Secondary | ICD-10-CM | POA: Diagnosis not present

## 2023-01-12 DIAGNOSIS — M9903 Segmental and somatic dysfunction of lumbar region: Secondary | ICD-10-CM | POA: Diagnosis not present

## 2023-01-12 DIAGNOSIS — M48061 Spinal stenosis, lumbar region without neurogenic claudication: Secondary | ICD-10-CM | POA: Diagnosis not present

## 2023-01-16 DIAGNOSIS — M9901 Segmental and somatic dysfunction of cervical region: Secondary | ICD-10-CM | POA: Diagnosis not present

## 2023-01-16 DIAGNOSIS — M4802 Spinal stenosis, cervical region: Secondary | ICD-10-CM | POA: Diagnosis not present

## 2023-01-16 DIAGNOSIS — M48061 Spinal stenosis, lumbar region without neurogenic claudication: Secondary | ICD-10-CM | POA: Diagnosis not present

## 2023-01-16 DIAGNOSIS — M9903 Segmental and somatic dysfunction of lumbar region: Secondary | ICD-10-CM | POA: Diagnosis not present

## 2023-01-17 DIAGNOSIS — M4802 Spinal stenosis, cervical region: Secondary | ICD-10-CM | POA: Diagnosis not present

## 2023-01-17 DIAGNOSIS — M48061 Spinal stenosis, lumbar region without neurogenic claudication: Secondary | ICD-10-CM | POA: Diagnosis not present

## 2023-01-17 DIAGNOSIS — M9903 Segmental and somatic dysfunction of lumbar region: Secondary | ICD-10-CM | POA: Diagnosis not present

## 2023-01-17 DIAGNOSIS — M9901 Segmental and somatic dysfunction of cervical region: Secondary | ICD-10-CM | POA: Diagnosis not present

## 2023-01-20 DIAGNOSIS — M9901 Segmental and somatic dysfunction of cervical region: Secondary | ICD-10-CM | POA: Diagnosis not present

## 2023-01-20 DIAGNOSIS — R221 Localized swelling, mass and lump, neck: Secondary | ICD-10-CM | POA: Diagnosis not present

## 2023-01-20 DIAGNOSIS — M4802 Spinal stenosis, cervical region: Secondary | ICD-10-CM | POA: Diagnosis not present

## 2023-01-20 DIAGNOSIS — M9903 Segmental and somatic dysfunction of lumbar region: Secondary | ICD-10-CM | POA: Diagnosis not present

## 2023-01-20 DIAGNOSIS — M48061 Spinal stenosis, lumbar region without neurogenic claudication: Secondary | ICD-10-CM | POA: Diagnosis not present

## 2023-01-24 DIAGNOSIS — R221 Localized swelling, mass and lump, neck: Secondary | ICD-10-CM | POA: Diagnosis not present

## 2023-01-24 DIAGNOSIS — M9903 Segmental and somatic dysfunction of lumbar region: Secondary | ICD-10-CM | POA: Diagnosis not present

## 2023-01-24 DIAGNOSIS — M9901 Segmental and somatic dysfunction of cervical region: Secondary | ICD-10-CM | POA: Diagnosis not present

## 2023-01-24 DIAGNOSIS — M4802 Spinal stenosis, cervical region: Secondary | ICD-10-CM | POA: Diagnosis not present

## 2023-01-24 DIAGNOSIS — M48061 Spinal stenosis, lumbar region without neurogenic claudication: Secondary | ICD-10-CM | POA: Diagnosis not present

## 2023-01-25 DIAGNOSIS — M48061 Spinal stenosis, lumbar region without neurogenic claudication: Secondary | ICD-10-CM | POA: Diagnosis not present

## 2023-01-25 DIAGNOSIS — M9903 Segmental and somatic dysfunction of lumbar region: Secondary | ICD-10-CM | POA: Diagnosis not present

## 2023-01-25 DIAGNOSIS — M4802 Spinal stenosis, cervical region: Secondary | ICD-10-CM | POA: Diagnosis not present

## 2023-01-25 DIAGNOSIS — M9901 Segmental and somatic dysfunction of cervical region: Secondary | ICD-10-CM | POA: Diagnosis not present

## 2023-01-27 DIAGNOSIS — M48061 Spinal stenosis, lumbar region without neurogenic claudication: Secondary | ICD-10-CM | POA: Diagnosis not present

## 2023-01-27 DIAGNOSIS — M9903 Segmental and somatic dysfunction of lumbar region: Secondary | ICD-10-CM | POA: Diagnosis not present

## 2023-01-27 DIAGNOSIS — M4802 Spinal stenosis, cervical region: Secondary | ICD-10-CM | POA: Diagnosis not present

## 2023-01-27 DIAGNOSIS — M9901 Segmental and somatic dysfunction of cervical region: Secondary | ICD-10-CM | POA: Diagnosis not present

## 2023-02-27 ENCOUNTER — Telehealth: Payer: Self-pay | Admitting: Adult Health

## 2023-02-27 NOTE — Telephone Encounter (Signed)
Patient has been taking Vraylar 1.5 every day for several months, but is just now reporting fatigue, weight loss, and GI upset. She said she has no energy to prepare meals and has to pick up meals from restaurants. She hasn't weighed herself to quantify the amount of weight loss. She wants to come off the medication. Reports she did not take Thursday night or Saturday night and could not tell any difference. Reports no benefit. Wants to sleep all the time. She is taking at night. Also taking Sonata.

## 2023-02-27 NOTE — Telephone Encounter (Signed)
Pt lvm that she has been on vraylar. It is making her very fatigued. She stays in the bed 11 hours. She is not able to function. Her arms are so weak she can't even pick up a plate. She wants to know how to get off of this medicine. Please call her at [phone]

## 2023-02-28 NOTE — Telephone Encounter (Signed)
Patient notified. She will split tablets and let us know when she needs a RF and Rx for increased dose will be sent.

## 2023-02-28 NOTE — Telephone Encounter (Signed)
Patient said she doesn't know what changed. She reported she tends to have to up her doses and is asking to increase sertraline from 100 mg to 150 mg. Has FU 11/8.

## 2023-03-08 ENCOUNTER — Other Ambulatory Visit: Payer: Self-pay | Admitting: Family Medicine

## 2023-03-08 DIAGNOSIS — Z1231 Encounter for screening mammogram for malignant neoplasm of breast: Secondary | ICD-10-CM

## 2023-03-08 DIAGNOSIS — M19011 Primary osteoarthritis, right shoulder: Secondary | ICD-10-CM | POA: Diagnosis not present

## 2023-03-09 DIAGNOSIS — E538 Deficiency of other specified B group vitamins: Secondary | ICD-10-CM | POA: Diagnosis not present

## 2023-03-09 DIAGNOSIS — Z79899 Other long term (current) drug therapy: Secondary | ICD-10-CM | POA: Diagnosis not present

## 2023-03-09 DIAGNOSIS — E1159 Type 2 diabetes mellitus with other circulatory complications: Secondary | ICD-10-CM | POA: Diagnosis not present

## 2023-03-10 ENCOUNTER — Encounter: Payer: Self-pay | Admitting: Adult Health

## 2023-03-10 ENCOUNTER — Ambulatory Visit: Payer: Medicare Other | Admitting: Adult Health

## 2023-03-10 DIAGNOSIS — G4700 Insomnia, unspecified: Secondary | ICD-10-CM

## 2023-03-10 DIAGNOSIS — F331 Major depressive disorder, recurrent, moderate: Secondary | ICD-10-CM

## 2023-03-10 DIAGNOSIS — F411 Generalized anxiety disorder: Secondary | ICD-10-CM

## 2023-03-10 NOTE — Progress Notes (Signed)
Stacey Santana [id number] 1946-01-12 77 y.o.  Subjective:   Patient ID:  Stacey Santana is a 77 y.o. (DOB 04/24/1946) female.  Chief Complaint: No chief complaint on file.   HPI Stacey Santana presents to the office today for follow-up of GAD, insomnia and MDD.   Describes mood today as "ok". Pleasant. Reports decreased tearfulness. Mood symptoms - reports feeling depressed - gradually coming on over the past 6 weeks. Reports anxiety in the afternoons. Denies irritability. Denies panic attacks. Reports worry, rumination, and over thinking - "about where I am now". Mood is lower. Stating "I don't feel good right now". Does not feel like current are working for her. She increased the Zoloft from 100mg  to 150mg  10 days ago and has not felt any benefit. Willing to consider other options. Decreased interest and motivation. Taking medications as prescribed.  Energy levels are not the greatest. Active, has not been exercising since September.   Enjoys some usual interests and activities. Lives alone. Reports she gets out of the house most days. Legally separated for past 14 years. Spending time with family. Appetite adequate. Weight loss - 190 from 195 pounds. Sleeps well most nights - taking Sonata 5mg  at bedtime. Averages 9 to 10 hours. Using CPAP machine. Focus and concentration stable. Completing tasks. Managing some aspects of household. Retired. Denies SI or HI.  Denies AH or VH. Denies substance use. Denies self harm.   Previous medication trials: Prozac - 10 years+, Zoloft - 10 years+, Depakote, Lamictal Clonazepam, Xanax, Effexor 15 years, Sonata, Wellbutrin, Rexulti, Abilify   Flowsheet Row Admission (Discharged) from 04/12/2022 in [facility]  C-SSRS RISK CATEGORY No Risk        Review of Systems:  Review of Systems  Musculoskeletal:  Negative for gait problem.  Neurological:  Negative for tremors.  Psychiatric/Behavioral:         Please refer to HPI     Medications: I have reviewed the patient's current medications.  Current Outpatient Medications  Medication Sig Dispense Refill   ALPRAZolam (XANAX) 0.25 MG tablet TAKE 1 TABLET BY MOUTH THREE TIMES A DAY AS NEEDED FOR ANXIETY 90 tablet 2   CALCIUM-MAGNESIUM-ZINC PO Take 1 tablet by mouth at bedtime.     cariprazine (VRAYLAR) 1.5 MG capsule Take 1 capsule (1.5 mg total) by mouth daily. 30 capsule 2   Cholecalciferol (VITAMIN D3) 1000 units CAPS Take 1,000 Units by mouth daily.      HYDROcodone-acetaminophen (NORCO/VICODIN) 5-325 MG tablet Take 1 tablet by mouth every 4 (four) hours as needed for severe pain. 42 tablet 0   losartan (COZAAR) 100 MG tablet Take 100 mg by mouth at bedtime.      Melatonin 10 MG TABS Take 10 mg by mouth at bedtime.     metFORMIN (GLUCOPHAGE-XR) 500 MG 24 hr tablet Take 500 mg by mouth 2 (two) times daily with a meal.     methocarbamol (ROBAXIN) 500 MG tablet Take 1 tablet (500 mg total) by mouth every 6 (six) hours as needed for muscle spasms (muscle pain). 40 tablet 2   Methylcobalamin 1000 MCG LOZG Take 1,000 mcg by mouth every Monday, Wednesday, and Friday.     metoprolol succinate (TOPROL-XL) 25 MG 24 hr tablet Take 25 mg by mouth daily.     Multiple Vitamins-Minerals (PRESERVISION AREDS 2) CAPS Take 1 capsule by mouth 2 (two) times daily.     MYRBETRIQ 50 MG TB24 tablet Take 50 mg by mouth daily.      omeprazole (  PRILOSEC) 20 MG capsule Take 20 mg by mouth at bedtime.      OVER THE COUNTER MEDICATION Apply 1 Application topically daily as needed (pain). CBD cream     polyethylene glycol (MIRALAX / GLYCOLAX) 17 g packet Take 17 g by mouth 2 (two) times daily. 14 each 0   Probiotic Product (PROBIOTIC PO) Take 1 capsule by mouth daily.     sertraline (ZOLOFT) 100 MG tablet Take 1 tablet (100 mg total) by mouth daily. 90 tablet 3   simvastatin (ZOCOR) 20 MG tablet Take 20 mg by mouth at bedtime.      zaleplon (SONATA) 5 MG capsule TAKE 1 CAPSULE BY MOUTH  EVERY DAY AT BEDTIME AS NEEDED 30 capsule 2   No current facility-administered medications for this visit.    Medication Side Effects: None  Allergies:  Allergies  Allergen Reactions   Hydroxyzine Other (See Comments)    Dizziness    Meloxicam Nausea And Vomiting        Oxycodone-Acetaminophen     Didn't work   Amoxicillin-Pot Clavulanate Diarrhea   Aripiprazole     ineffective, dizziness   Brexpiprazole     Ineffective   Bupropion Hcl Er (Xl)     ineffective   Clonazepam Other (See Comments)    dizziness   Darvon [Propoxyphene] Nausea And Vomiting   Escitalopram Oxalate     ineffective   Fluoxetine     ineffective after 10 yrs   Pollen Extract    Scopolamine Other (See Comments)    Psych. Disturbance ?   Sertraline Hcl     ineffective after 10 yrs   Viibryd [Vilazodone Hcl] Diarrhea    Past Medical History:  Diagnosis Date   Anxiety    Arthritis    some degenerative signs in other places of her body    Complication of anesthesia 1993   used scopolamine patch during surgery- manic episode occurred post op , met with the anesth. dept. afterwards & they thought that the episode was related to scop patch.    Depression    chronic    Diabetes mellitus without complication (HCC)    type II   GERD (gastroesophageal reflux disease)    High cholesterol    Hypertension    Peripheral vascular disease (HCC)    Sleep apnea    CPAP     Past Medical History, Surgical history, Social history, and Family history were reviewed and updated as appropriate.   Please see review of systems for further details on the patient's review from today.   Objective:   Physical Exam:  There were no vitals taken for this visit.  Physical Exam Constitutional:      General: She is not in acute distress. Musculoskeletal:        General: No deformity.  Neurological:     Mental Status: She is alert and oriented to person, place, and time.     Coordination: Coordination normal.   Psychiatric:        Attention and Perception: Attention and perception normal. She does not perceive auditory or visual hallucinations.        Mood and Affect: Mood normal. Mood is not anxious or depressed. Affect is not labile, blunt, angry or inappropriate.        Speech: Speech normal.        Behavior: Behavior normal.        Thought Content: Thought content normal. Thought content is not paranoid or delusional. Thought content does not  include homicidal or suicidal ideation. Thought content does not include homicidal or suicidal plan.        Cognition and Memory: Cognition and memory normal.        Judgment: Judgment normal.     Comments: Insight intact     Lab Review:     Component Value Date/Time   NA 139 04/13/2022 0335   K 4.0 04/13/2022 0335   CL 108 04/13/2022 0335   CO2 25 04/13/2022 0335   GLUCOSE 166 (H) 04/13/2022 0335   BUN 15 04/13/2022 0335   CREATININE 0.61 04/13/2022 0335   CALCIUM 8.7 (L) 04/13/2022 0335   GFRNONAA >60 04/13/2022 0335   GFRAA >60 05/18/2017 1305       Component Value Date/Time   WBC 12.1 (H) 04/13/2022 0335   RBC 3.52 (L) 04/13/2022 0335   HGB 10.5 (L) 04/13/2022 0335   HCT 32.8 (L) 04/13/2022 0335   PLT 221 04/13/2022 0335   MCV 93.2 04/13/2022 0335   MCH 29.8 04/13/2022 0335   MCHC 32.0 04/13/2022 0335   RDW 13.1 04/13/2022 0335    No results found for: "POCLITH", "LITHIUM"   No results found for: "PHENYTOIN", "PHENOBARB", "VALPROATE", "CBMZ"   .res Assessment: Plan:    Plan:  PDMP reviewed  Xanax 0.25mg  TID as needed Patient notified.  Zoloft 150mg  daily - increased 2 weeks ago  Sonata 5mg  at hs.  Vraylar 1.5mg  daily script sent - samples given x 4 weeks.   Seeing therapist - Merry Lofty - last seen over a year ago.  RTC 4 weeks  Time spent with patient was 20 minutes. Greater than 50% of face to face time with patient was spent on counseling and coordination of care.   Patient advised to contact office with  any questions, adverse effects, or acute worsening in signs and symptoms. Diagnoses and all orders for this visit:  Major depressive disorder, recurrent episode, moderate (HCC)  Generalized anxiety disorder  Insomnia, unspecified type     Please see After Visit Summary for patient specific instructions.  Future Appointments  Date Time Provider Department Center  04/12/2023  3:50 PM [facility]    No orders of the defined types were placed in this encounter.   -------------------------------

## 2023-03-13 DIAGNOSIS — E66811 Obesity, class 1: Secondary | ICD-10-CM | POA: Diagnosis not present

## 2023-03-13 DIAGNOSIS — F3341 Major depressive disorder, recurrent, in partial remission: Secondary | ICD-10-CM | POA: Diagnosis not present

## 2023-03-13 DIAGNOSIS — N3941 Urge incontinence: Secondary | ICD-10-CM | POA: Diagnosis not present

## 2023-03-13 DIAGNOSIS — I10 Essential (primary) hypertension: Secondary | ICD-10-CM | POA: Diagnosis not present

## 2023-03-13 DIAGNOSIS — I700 Atherosclerosis of aorta: Secondary | ICD-10-CM | POA: Diagnosis not present

## 2023-03-13 DIAGNOSIS — G4733 Obstructive sleep apnea (adult) (pediatric): Secondary | ICD-10-CM | POA: Diagnosis not present

## 2023-03-13 DIAGNOSIS — E1159 Type 2 diabetes mellitus with other circulatory complications: Secondary | ICD-10-CM | POA: Diagnosis not present

## 2023-03-13 DIAGNOSIS — K219 Gastro-esophageal reflux disease without esophagitis: Secondary | ICD-10-CM | POA: Diagnosis not present

## 2023-03-13 DIAGNOSIS — E785 Hyperlipidemia, unspecified: Secondary | ICD-10-CM | POA: Diagnosis not present

## 2023-03-13 DIAGNOSIS — M85851 Other specified disorders of bone density and structure, right thigh: Secondary | ICD-10-CM | POA: Diagnosis not present

## 2023-03-13 DIAGNOSIS — Z6832 Body mass index (BMI) 32.0-32.9, adult: Secondary | ICD-10-CM | POA: Diagnosis not present

## 2023-03-14 ENCOUNTER — Telehealth: Payer: Self-pay | Admitting: Adult Health

## 2023-03-14 NOTE — Telephone Encounter (Signed)
Pt called at 11:51a requesting refill of Sertraline.  I missed sending the encounter earlier in the day (SORRY).  She is taking 150mg  a day. She called back at 4:57p to advise that the pharmacy said it would be better for insurance purposes for her to have a script for 100mg  and one for 50 mg.  Pls send to [facility]

## 2023-03-15 ENCOUNTER — Other Ambulatory Visit: Payer: Self-pay

## 2023-03-15 DIAGNOSIS — F331 Major depressive disorder, recurrent, moderate: Secondary | ICD-10-CM

## 2023-03-15 DIAGNOSIS — F411 Generalized anxiety disorder: Secondary | ICD-10-CM

## 2023-03-15 MED ORDER — SERTRALINE HCL 50 MG PO TABS: 50.0000 mg | ORAL_TABLET | Freq: Every day | ORAL | 0 refills | Status: DC

## 2023-03-15 MED ORDER — SERTRALINE HCL 100 MG PO TABS: 100.0000 mg | ORAL_TABLET | Freq: Every day | ORAL | 0 refills | Status: DC

## 2023-03-15 NOTE — Telephone Encounter (Signed)
Sent two rx. 100 mg and 50 mg.

## 2023-03-23 ENCOUNTER — Telehealth: Payer: Self-pay | Admitting: Adult Health

## 2023-03-23 NOTE — Telephone Encounter (Signed)
Next visit is 04/04/23. Lastar states she has been having ongoing depression since taking the Zoloft 150 mg. It has not helped her, she can barely move, function and eat. She needs something else. Her phone number is [phone]. Pharmacy is:  [id number] [place], [place] - [id number]   Phone: [phone]  Fax: [phone]

## 2023-03-23 NOTE — Telephone Encounter (Signed)
Ms. Stacey Santana states that an increase of Zoloft happened 10/30. She reports feeling worse since the end of September. She denies any SI or HI, and she's not attempting anything. She states she can not live like this anymore. She reports that she is staying in the bed but is not sleeping very much, she slept about 6 hours last night. Her sleep has decreased since the increase of Zoloft. She reports not having an appetite, she has weighs 184, a loss of 12 pounds. She reports that she does not have the energy or the want to get up and do anything. She has her daughters and some friends that are checking in on her.   She would like something other than Zoloft.

## 2023-03-24 ENCOUNTER — Other Ambulatory Visit: Payer: Self-pay

## 2023-03-24 MED ORDER — ARIPIPRAZOLE 5 MG PO TABS: ORAL_TABLET | ORAL | 1 refills | Status: DC

## 2023-03-24 NOTE — Telephone Encounter (Signed)
Med review down and discussed with Gina. Recommendation is to stop Vraylar and start Abilify 5 mg, 1/2 tablet x 1 week and then 1 full tablet. Rx sent. Patient aware.

## 2023-04-04 ENCOUNTER — Encounter: Payer: Self-pay | Admitting: Adult Health

## 2023-04-04 ENCOUNTER — Ambulatory Visit: Payer: Medicare Other | Admitting: Adult Health

## 2023-04-04 DIAGNOSIS — F411 Generalized anxiety disorder: Secondary | ICD-10-CM | POA: Diagnosis not present

## 2023-04-04 DIAGNOSIS — F331 Major depressive disorder, recurrent, moderate: Secondary | ICD-10-CM

## 2023-04-04 DIAGNOSIS — G4700 Insomnia, unspecified: Secondary | ICD-10-CM

## 2023-04-04 MED ORDER — SERTRALINE HCL 100 MG PO TABS
100.0000 mg | ORAL_TABLET | Freq: Every day | ORAL | 1 refills | Status: DC
Start: 2023-04-04 — End: 2023-07-18

## 2023-04-04 MED ORDER — SERTRALINE HCL 50 MG PO TABS: 50.0000 mg | ORAL_TABLET | Freq: Every day | ORAL | 1 refills | Status: DC

## 2023-04-04 MED ORDER — ZALEPLON 5 MG PO CAPS
ORAL_CAPSULE | ORAL | 2 refills | Status: DC
Start: 2023-04-04 — End: 2023-07-18

## 2023-04-04 NOTE — Progress Notes (Signed)
Stacey Santana [id number] [birth year] 77 y.o.  Subjective:   Patient ID:  Stacey Santana is a 77 y.o. (DOB 03-03-1946) female.  Chief Complaint: No chief complaint on file.   HPI Stacey Santana presents to the office today for follow-up of GAD, insomnia and MDD.   Describes mood today as "ok". Pleasant. Denies tearfulness. Mood symptoms - reports decreased depression. Reports decreased anxiety in the afternoons - "not as bad as it was". Reports some irritability. Denies panic attacks. Reports some worry, rumination, and over thinking - "about being able to take care of herself". Mood is a "little better". Stating "I can tell some difference with the medication". Willing to consider increase in dose. Decreased interest and motivation. Taking medications as prescribed.  Energy levels are a "little" bit better. Active, has not been exercising since September.   Enjoys some usual interests and activities. Lives alone. Reports she gets out of the house some days. Legally separated for past 14 years. Spending time with family. Appetite decreased. Weight loss - 184 from 195 pounds. Sleeps well most nights - taking Sonata 5mg  at bedtime. Averages 7 to 8 hours. Denies daytime napping. Using CPAP machine. Focus and concentration stable. Completing tasks. Managing some aspects of household. Retired. Denies SI or HI.  Denies AH or VH. Denies substance use. Denies self harm.   Previous medication trials: Prozac - 10 years+, Zoloft - 10 years+, Depakote, Lamictal Clonazepam, Xanax, Effexor 15 years, Sonata, Wellbutrin, Rexulti, Abilify   Flowsheet Row Admission (Discharged) from 04/12/2022 in [facility]     Flowsheet Row Admission (Discharged) from 04/12/2022 in [facility]  C-SSRS RISK CATEGORY No Risk        Review of Systems:  Review of Systems  Musculoskeletal:  Negative for gait problem.  Neurological:  Negative for tremors.   Psychiatric/Behavioral:         Please refer to HPI    Medications: I have reviewed the patient's current medications.  Current Outpatient Medications  Medication Sig Dispense Refill   ALPRAZolam (XANAX) 0.25 MG tablet TAKE 1 TABLET BY MOUTH THREE TIMES A DAY AS NEEDED FOR ANXIETY 90 tablet 2   ARIPiprazole (ABILIFY) 5 MG tablet Take 1/2 tablet for one week and then take a full tablet. 30 tablet 1   CALCIUM-MAGNESIUM-ZINC PO Take 1 tablet by mouth at bedtime.     cariprazine (VRAYLAR) 1.5 MG capsule Take 1 capsule (1.5 mg total) by mouth daily. 30 capsule 2   Cholecalciferol (VITAMIN D3) 1000 units CAPS Take 1,000 Units by mouth daily.      HYDROcodone-acetaminophen (NORCO/VICODIN) 5-325 MG tablet Take 1 tablet by mouth every 4 (four) hours as needed for severe pain. 42 tablet 0   losartan (COZAAR) 100 MG tablet Take 100 mg by mouth at bedtime.      Melatonin 10 MG TABS Take 10 mg by mouth at bedtime.     metFORMIN (GLUCOPHAGE-XR) 500 MG 24 hr tablet Take 500 mg by mouth 2 (two) times daily with a meal.     methocarbamol (ROBAXIN) 500 MG tablet Take 1 tablet (500 mg total) by mouth every 6 (six) hours as needed for muscle spasms (muscle pain). 40 tablet 2   Methylcobalamin 1000 MCG LOZG Take 1,000 mcg by mouth every Monday, Wednesday, and Friday.     metoprolol succinate (TOPROL-XL) 25 MG 24 hr tablet Take 25 mg by mouth daily.     Multiple Vitamins-Minerals (PRESERVISION AREDS 2) CAPS Take 1 capsule by mouth 2 (two)  times daily.     MYRBETRIQ 50 MG TB24 tablet Take 50 mg by mouth daily.      omeprazole (PRILOSEC) 20 MG capsule Take 20 mg by mouth at bedtime.      OVER THE COUNTER MEDICATION Apply 1 Application topically daily as needed (pain). CBD cream     polyethylene glycol (MIRALAX / GLYCOLAX) 17 g packet Take 17 g by mouth 2 (two) times daily. 14 each 0   Probiotic Product (PROBIOTIC PO) Take 1 capsule by mouth daily.     sertraline (ZOLOFT) 100 MG tablet Take 1 tablet (100 mg  total) by mouth daily. 30 tablet 0   sertraline (ZOLOFT) 50 MG tablet Take 1 tablet (50 mg total) by mouth daily. 30 tablet 0   simvastatin (ZOCOR) 20 MG tablet Take 20 mg by mouth at bedtime.      zaleplon (SONATA) 5 MG capsule TAKE 1 CAPSULE BY MOUTH EVERY DAY AT BEDTIME AS NEEDED 30 capsule 2   No current facility-administered medications for this visit.    Medication Side Effects: None  Allergies:  Allergies  Allergen Reactions   Hydroxyzine Other (See Comments)    Dizziness    Meloxicam Nausea And Vomiting        Oxycodone-Acetaminophen     Didn't work   Amoxicillin-Pot Clavulanate Diarrhea   Aripiprazole     ineffective, dizziness   Brexpiprazole     Ineffective   Bupropion Hcl Er (Xl)     ineffective   Clonazepam Other (See Comments)    dizziness   Darvon [Propoxyphene] Nausea And Vomiting   Escitalopram Oxalate     ineffective   Fluoxetine     ineffective after 10 yrs   Pollen Extract    Scopolamine Other (See Comments)    Psych. Disturbance ?   Sertraline Hcl     ineffective after 10 yrs   Viibryd [Vilazodone Hcl] Diarrhea    Past Medical History:  Diagnosis Date   Anxiety    Arthritis    some degenerative signs in other places of her body    Complication of anesthesia 1993   used scopolamine patch during surgery- manic episode occurred post op , met with the anesth. dept. afterwards & they thought that the episode was related to scop patch.    Depression    chronic    Diabetes mellitus without complication (HCC)    type II   GERD (gastroesophageal reflux disease)    High cholesterol    Hypertension    Peripheral vascular disease (HCC)    Sleep apnea    CPAP     Past Medical History, Surgical history, Social history, and Family history were reviewed and updated as appropriate.   Please see review of systems for further details on the patient's review from today.   Objective:   Physical Exam:  There were no vitals taken for this  visit.  Physical Exam Constitutional:      General: She is not in acute distress. Musculoskeletal:        General: No deformity.  Neurological:     Mental Status: She is alert and oriented to person, place, and time.     Coordination: Coordination normal.  Psychiatric:        Attention and Perception: Attention and perception normal. She does not perceive auditory or visual hallucinations.        Mood and Affect: Affect is not labile, blunt, angry or inappropriate.        Speech: Speech  normal.        Behavior: Behavior normal.        Thought Content: Thought content normal. Thought content is not paranoid or delusional. Thought content does not include homicidal or suicidal ideation. Thought content does not include homicidal or suicidal plan.        Cognition and Memory: Cognition and memory normal.        Judgment: Judgment normal.     Comments: Insight intact     Lab Review:     Component Value Date/Time   NA 139 04/13/2022 0335   K 4.0 04/13/2022 0335   CL 108 04/13/2022 0335   CO2 25 04/13/2022 0335   GLUCOSE 166 (H) 04/13/2022 0335   BUN 15 04/13/2022 0335   CREATININE 0.61 04/13/2022 0335   CALCIUM 8.7 (L) 04/13/2022 0335   GFRNONAA >60 04/13/2022 0335   GFRAA >60 05/18/2017 1305       Component Value Date/Time   WBC 12.1 (H) 04/13/2022 0335   RBC 3.52 (L) 04/13/2022 0335   HGB 10.5 (L) 04/13/2022 0335   HCT 32.8 (L) 04/13/2022 0335   PLT 221 04/13/2022 0335   MCV 93.2 04/13/2022 0335   MCH 29.8 04/13/2022 0335   MCHC 32.0 04/13/2022 0335   RDW 13.1 04/13/2022 0335    No results found for: "POCLITH", "LITHIUM"   No results found for: "PHENYTOIN", "PHENOBARB", "VALPROATE", "CBMZ"   .res Assessment: Plan:    Plan:  PDMP reviewed  Increase Abilify 2.5mg  to 5mg  daily  Xanax 0.25mg  TID as needed - taking once daily Zoloft 150mg  daily - increased 2 weeks ago Sonata 5mg  at hs.  Seeing therapist - Merry Lofty - last seen over a year ago.  RTC 4  weeks  Patient advised to contact office with any questions, adverse effects, or acute worsening in signs and symptoms.  There are no diagnoses linked to this encounter.   Please see After Visit Summary for patient specific instructions.  Future Appointments  Date Time Provider Department Center  04/04/2023  1:40 PM Aquilla Voiles, Thereasa Solo, NP [facility] None  04/12/2023  3:50 PM [facility]    No orders of the defined types were placed in this encounter.   -------------------------------

## 2023-04-12 ENCOUNTER — Ambulatory Visit
Admission: RE | Admit: 2023-04-12 | Discharge: 2023-04-12 | Disposition: A | Discharge status: Home / Self Care | Payer: Medicare Other | Source: Ambulatory Visit | Attending: Family Medicine | Admitting: Family Medicine

## 2023-04-12 DIAGNOSIS — Z1231 Encounter for screening mammogram for malignant neoplasm of breast: Secondary | ICD-10-CM | POA: Diagnosis not present

## 2023-04-14 ENCOUNTER — Telehealth: Payer: Self-pay | Admitting: Adult Health

## 2023-04-14 ENCOUNTER — Other Ambulatory Visit: Payer: Self-pay

## 2023-04-14 MED ORDER — OLANZAPINE 2.5 MG PO TABS: 2.5000 mg | ORAL_TABLET | Freq: Every day | ORAL | 0 refills | Status: DC

## 2023-04-14 NOTE — Telephone Encounter (Signed)
Patient lvm reporting that she has taken Abilify for the last 21 days with no results. She is requesting to try other options before her scheduled appointment on 05/02/23.

## 2023-04-14 NOTE — Telephone Encounter (Signed)
Stacey Santana states she seems as if the Abilify messes with her sleep if she takes it in the am, but if she takes it at night she does not have any issues with her sleep. She tried a trail back in June 2023 with no help. She reports she does have an appetite or a desire to drink. She feels as though she is getting weak from not eating. She also does not have any energy; she is barely able to complete daily hygiene tasks.  She would like to try something else before her next appt 12/31 Please advise.

## 2023-04-14 NOTE — Telephone Encounter (Signed)
Patient notified

## 2023-04-17 ENCOUNTER — Other Ambulatory Visit: Payer: Self-pay | Admitting: Adult Health

## 2023-04-30 ENCOUNTER — Other Ambulatory Visit: Payer: Self-pay | Admitting: Adult Health

## 2023-05-01 ENCOUNTER — Other Ambulatory Visit: Payer: Self-pay | Admitting: Adult Health

## 2023-05-01 MED ORDER — ARIPIPRAZOLE 5 MG PO TABS: 5.0000 mg | ORAL_TABLET | Freq: Every day | ORAL | 2 refills | Status: DC

## 2023-05-01 NOTE — Telephone Encounter (Signed)
Has appt tomorrow 12/31

## 2023-05-01 NOTE — Telephone Encounter (Signed)
No, she is not out.

## 2023-05-02 ENCOUNTER — Encounter: Payer: Self-pay | Admitting: Adult Health

## 2023-05-02 ENCOUNTER — Ambulatory Visit (INDEPENDENT_AMBULATORY_CARE_PROVIDER_SITE_OTHER): Payer: Medicare Other | Admitting: Adult Health

## 2023-05-02 DIAGNOSIS — F331 Major depressive disorder, recurrent, moderate: Secondary | ICD-10-CM | POA: Diagnosis not present

## 2023-05-02 DIAGNOSIS — G4700 Insomnia, unspecified: Secondary | ICD-10-CM | POA: Diagnosis not present

## 2023-05-02 DIAGNOSIS — F411 Generalized anxiety disorder: Secondary | ICD-10-CM

## 2023-05-02 NOTE — Progress Notes (Signed)
 Stacey Santana [id number] 07/13/1945 77 y.o.  Subjective:   Patient ID:  Stacey Santana is a 77 y.o. (DOB 10-09-1945) female.  Chief Complaint: No chief complaint on file.   HPI [name] presents to the office today for follow-up of GAD, insomnia and MDD.   Describes mood today as ok. Pleasant. Denies tearfulness. Mood symptoms - reports decreased depression, anxiety, and irritability. Denies panic attacks. Reports some worry, rumination, and over thinking. Reports mood has improved. Stating I feel like I'm doing better. Improved interest and motivation. Taking medications as prescribed.  Energy levels are a little better. Active, has not been exercising since September - plans to restart.   Enjoys some usual interests and activities. Lives alone. Legally separated for past 14 years. Spending time with family. Appetite decreased. Weight loss - 180 from 195 pounds. Sleeps well most nights. Averages 9 hours. Denies daytime napping. Using CPAP machine. Focus and concentration stable. Completing tasks. Managing some aspects of household. Retired. Denies SI or HI.  Denies AH or VH. Denies substance use. Denies self harm.   Previous medication trials: Prozac - 10 years+, Zoloft  - 10 years+, Depakote, Lamictal Clonazepam, Xanax , Effexor  15 years, Sonata , Wellbutrin , Rexulti , Abilify    Flowsheet Row Admission (Discharged) from 04/12/2022 in [facility]  C-SSRS RISK CATEGORY No Risk        Review of Systems:  Review of Systems  Musculoskeletal:  Negative for gait problem.  Neurological:  Negative for tremors.  Psychiatric/Behavioral:         Please refer to HPI    Medications: I have reviewed the patient's current medications.  Current Outpatient Medications  Medication Sig Dispense Refill   ALPRAZolam  (XANAX ) 0.25 MG tablet TAKE 1 TABLET BY MOUTH THREE TIMES A DAY AS NEEDED FOR ANXIETY 90 tablet 2   ARIPiprazole  (ABILIFY ) 5 MG tablet Take 1 tablet  (5 mg total) by mouth daily. 30 tablet 2   CALCIUM-MAGNESIUM-ZINC PO Take 1 tablet by mouth at bedtime.     Cholecalciferol (VITAMIN D3) 1000 units CAPS Take 1,000 Units by mouth daily.      HYDROcodone -acetaminophen  (NORCO/VICODIN) 5-325 MG tablet Take 1 tablet by mouth every 4 (four) hours as needed for severe pain. 42 tablet 0   losartan  (COZAAR ) 100 MG tablet Take 100 mg by mouth at bedtime.      Melatonin 10 MG TABS Take 10 mg by mouth at bedtime.     metFORMIN  (GLUCOPHAGE -XR) 500 MG 24 hr tablet Take 500 mg by mouth 2 (two) times daily with a meal.     methocarbamol  (ROBAXIN ) 500 MG tablet Take 1 tablet (500 mg total) by mouth every 6 (six) hours as needed for muscle spasms (muscle pain). 40 tablet 2   Methylcobalamin 1000 MCG LOZG Take 1,000 mcg by mouth every Monday, Wednesday, and Friday.     metoprolol  succinate (TOPROL -XL) 25 MG 24 hr tablet Take 25 mg by mouth daily.     Multiple Vitamins-Minerals (PRESERVISION AREDS 2) CAPS Take 1 capsule by mouth 2 (two) times daily.     MYRBETRIQ  50 MG TB24 tablet Take 50 mg by mouth daily.      OLANZapine  (ZYPREXA ) 2.5 MG tablet Take 1 tablet (2.5 mg total) by mouth at bedtime. 30 tablet 0   omeprazole (PRILOSEC) 20 MG capsule Take 20 mg by mouth at bedtime.      OVER THE COUNTER MEDICATION Apply 1 Application topically daily as needed (pain). CBD cream     polyethylene glycol (MIRALAX  / GLYCOLAX )  17 g packet Take 17 g by mouth 2 (two) times daily. 14 each 0   Probiotic Product (PROBIOTIC PO) Take 1 capsule by mouth daily.     sertraline  (ZOLOFT ) 100 MG tablet Take 1 tablet (100 mg total) by mouth daily. 90 tablet 1   sertraline  (ZOLOFT ) 50 MG tablet Take 1 tablet (50 mg total) by mouth daily. 90 tablet 1   simvastatin  (ZOCOR ) 20 MG tablet Take 20 mg by mouth at bedtime.      zaleplon  (SONATA ) 5 MG capsule TAKE 1 CAPSULE BY MOUTH EVERY DAY AT BEDTIME AS NEEDED 30 capsule 2   No current facility-administered medications for this visit.     Medication Side Effects: None  Allergies:  Allergies  Allergen Reactions   Hydroxyzine Other (See Comments)    Dizziness    Meloxicam  Nausea And Vomiting        Oxycodone -Acetaminophen      Didn't work   Amoxicillin-Pot Clavulanate Diarrhea   Brexpiprazole      Ineffective   Bupropion  Hcl Er (Xl)     ineffective   Clonazepam Other (See Comments)    dizziness   Darvon [Propoxyphene] Nausea And Vomiting   Escitalopram  Oxalate     ineffective   Fluoxetine     ineffective after 10 yrs   Pollen Extract    Scopolamine Other (See Comments)    Psych. Disturbance ?   Sertraline  Hcl     ineffective after 10 yrs   Viibryd  [Vilazodone  Hcl] Diarrhea    Past Medical History:  Diagnosis Date   Anxiety    Arthritis    some degenerative signs in other places of her body    Complication of anesthesia 1993   used scopolamine patch during surgery- manic episode occurred post op , met with the anesth. dept. afterwards & they thought that the episode was related to scop patch.    Depression    chronic    Diabetes mellitus without complication (HCC)    type II   GERD (gastroesophageal reflux disease)    High cholesterol    Hypertension    Peripheral vascular disease (HCC)    Sleep apnea    CPAP     Past Medical History, Surgical history, Social history, and Family history were reviewed and updated as appropriate.   Please see review of systems for further details on the patient's review from today.   Objective:   Physical Exam:  There were no vitals taken for this visit.  Physical Exam Constitutional:      General: She is not in acute distress. Musculoskeletal:        General: No deformity.  Neurological:     Mental Status: She is alert and oriented to person, place, and time.     Coordination: Coordination normal.  Psychiatric:        Attention and Perception: Attention and perception normal. She does not perceive auditory or visual hallucinations.        Mood and  Affect: Affect is not labile, blunt, angry or inappropriate.        Speech: Speech normal.        Behavior: Behavior normal.        Thought Content: Thought content normal. Thought content is not paranoid or delusional. Thought content does not include homicidal or suicidal ideation. Thought content does not include homicidal or suicidal plan.        Cognition and Memory: Cognition and memory normal.        Judgment: Judgment  normal.     Comments: Insight intact     Lab Review:     Component Value Date/Time   NA 139 04/13/2022 0335   K 4.0 04/13/2022 0335   CL 108 04/13/2022 0335   CO2 25 04/13/2022 0335   GLUCOSE 166 (H) 04/13/2022 0335   BUN 15 04/13/2022 0335   CREATININE 0.61 04/13/2022 0335   CALCIUM 8.7 (L) 04/13/2022 0335   GFRNONAA >60 04/13/2022 0335   GFRAA >60 05/18/2017 1305       Component Value Date/Time   WBC 12.1 (H) 04/13/2022 0335   RBC 3.52 (L) 04/13/2022 0335   HGB 10.5 (L) 04/13/2022 0335   HCT 32.8 (L) 04/13/2022 0335   PLT 221 04/13/2022 0335   MCV 93.2 04/13/2022 0335   MCH 29.8 04/13/2022 0335   MCHC 32.0 04/13/2022 0335   RDW 13.1 04/13/2022 0335    No results found for: POCLITH, LITHIUM   No results found for: PHENYTOIN, PHENOBARB, VALPROATE, CBMZ   .res Assessment: Plan:   Plan:  PDMP reviewed  Added Zyprexa  2.5mg  daily  D/C Abilify  5mg  daily  Xanax  0.25mg  TID as needed - taking once daily Zoloft  150mg  daily - increased 2 weeks ago Sonata  5mg  at hs.  Seeing therapist - Peggye Macintosh - last seen over a year ago.  RTC 3 months  Patient advised to contact office with any questions, adverse effects, or acute worsening in signs and symptoms.  There are no diagnoses linked to this encounter.   Please see After Visit Summary for patient specific instructions.  Future Appointments  Date Time Provider Department Center  05/02/2023  2:00 PM Lean Jaeger Nattalie, NP [facility] None    No orders of the defined types were  placed in this encounter.   -------------------------------

## 2023-05-06 ENCOUNTER — Other Ambulatory Visit: Payer: Self-pay | Admitting: Adult Health

## 2023-05-29 DIAGNOSIS — G4733 Obstructive sleep apnea (adult) (pediatric): Secondary | ICD-10-CM | POA: Diagnosis not present

## 2023-06-01 DIAGNOSIS — I10 Essential (primary) hypertension: Secondary | ICD-10-CM | POA: Diagnosis not present

## 2023-06-01 DIAGNOSIS — G4733 Obstructive sleep apnea (adult) (pediatric): Secondary | ICD-10-CM | POA: Diagnosis not present

## 2023-06-01 DIAGNOSIS — M75101 Unspecified rotator cuff tear or rupture of right shoulder, not specified as traumatic: Secondary | ICD-10-CM | POA: Diagnosis not present

## 2023-06-01 DIAGNOSIS — E785 Hyperlipidemia, unspecified: Secondary | ICD-10-CM | POA: Diagnosis not present

## 2023-06-01 DIAGNOSIS — E1159 Type 2 diabetes mellitus with other circulatory complications: Secondary | ICD-10-CM | POA: Diagnosis not present

## 2023-06-01 DIAGNOSIS — F322 Major depressive disorder, single episode, severe without psychotic features: Secondary | ICD-10-CM | POA: Diagnosis not present

## 2023-06-01 DIAGNOSIS — Z6832 Body mass index (BMI) 32.0-32.9, adult: Secondary | ICD-10-CM | POA: Diagnosis not present

## 2023-06-12 DIAGNOSIS — Z4789 Encounter for other orthopedic aftercare: Secondary | ICD-10-CM | POA: Diagnosis not present

## 2023-06-12 DIAGNOSIS — Z96612 Presence of left artificial shoulder joint: Secondary | ICD-10-CM | POA: Diagnosis not present

## 2023-06-12 DIAGNOSIS — M19011 Primary osteoarthritis, right shoulder: Secondary | ICD-10-CM | POA: Diagnosis not present

## 2023-06-13 DIAGNOSIS — M75101 Unspecified rotator cuff tear or rupture of right shoulder, not specified as traumatic: Secondary | ICD-10-CM | POA: Diagnosis not present

## 2023-06-13 DIAGNOSIS — I10 Essential (primary) hypertension: Secondary | ICD-10-CM | POA: Diagnosis not present

## 2023-06-13 DIAGNOSIS — E1159 Type 2 diabetes mellitus with other circulatory complications: Secondary | ICD-10-CM | POA: Diagnosis not present

## 2023-06-30 DIAGNOSIS — G8918 Other acute postprocedural pain: Secondary | ICD-10-CM | POA: Diagnosis not present

## 2023-06-30 DIAGNOSIS — M25711 Osteophyte, right shoulder: Secondary | ICD-10-CM | POA: Diagnosis not present

## 2023-06-30 DIAGNOSIS — M19011 Primary osteoarthritis, right shoulder: Secondary | ICD-10-CM | POA: Diagnosis not present

## 2023-07-12 DIAGNOSIS — Z96611 Presence of right artificial shoulder joint: Secondary | ICD-10-CM | POA: Diagnosis not present

## 2023-07-12 DIAGNOSIS — Z471 Aftercare following joint replacement surgery: Secondary | ICD-10-CM | POA: Diagnosis not present

## 2023-07-18 ENCOUNTER — Encounter: Payer: Self-pay | Admitting: Adult Health

## 2023-07-18 ENCOUNTER — Ambulatory Visit: Payer: Medicare Other | Admitting: Adult Health

## 2023-07-18 DIAGNOSIS — F411 Generalized anxiety disorder: Secondary | ICD-10-CM | POA: Diagnosis not present

## 2023-07-18 DIAGNOSIS — G4700 Insomnia, unspecified: Secondary | ICD-10-CM

## 2023-07-18 DIAGNOSIS — F331 Major depressive disorder, recurrent, moderate: Secondary | ICD-10-CM | POA: Diagnosis not present

## 2023-07-18 MED ORDER — ALPRAZOLAM 0.25 MG PO TABS: ORAL_TABLET | ORAL | 2 refills | Status: DC

## 2023-07-18 MED ORDER — OLANZAPINE 2.5 MG PO TABS: 2.5000 mg | ORAL_TABLET | Freq: Every day | ORAL | 1 refills | Status: DC

## 2023-07-18 MED ORDER — SERTRALINE HCL 50 MG PO TABS: 50.0000 mg | ORAL_TABLET | Freq: Every day | ORAL | 1 refills | Status: DC

## 2023-07-18 MED ORDER — SERTRALINE HCL 100 MG PO TABS: 100.0000 mg | ORAL_TABLET | Freq: Every day | ORAL | 1 refills | Status: DC

## 2023-07-18 MED ORDER — ZALEPLON 5 MG PO CAPS
ORAL_CAPSULE | ORAL | 2 refills | Status: DC
Start: 2023-07-18 — End: 2023-10-25

## 2023-07-18 NOTE — Progress Notes (Signed)
 Stacey Santana [id number] [birth year] 78 y.o.  Subjective:   Patient ID:  Stacey Santana is a 78 y.o. (DOB 11/02/1945) female.  Chief Complaint: No chief complaint on file.   HPI Stacey Santana presents to the office today for follow-up of GAD, insomnia and MDD.   Describes mood today as "ok". Pleasant. Denies tearfulness. Mood symptoms - denies depression, anxiety and irritability. Reports improved interest and motivation. Denies panic attacks. Reports some worry, rumination, and over thinking. Reports mood has improved. Stating "I feel like I'm doing ok".  Taking medications as prescribed.  Energy levels are a better. Active, has not been exercising with recent surgery.  Enjoys some usual interests and activities. Lives alone. Legally separated for past 14 years. Spending time with family. Appetite decreased. Weight stable - 184 pounds. Sleeps well most nights. Averages 9 hours. Denies daytime napping. Using CPAP machine. Focus and concentration stable. Completing tasks. Managing some aspects of household. Retired. Denies SI or HI.  Denies AH or VH. Denies substance use. Denies self harm.   Previous medication trials: Prozac - 10 years+, Zoloft - 10 years+, Depakote, Lamictal Clonazepam, Xanax, Effexor 15 years, Sonata, Wellbutrin, Rexulti, Abilify    Flowsheet Row Admission (Discharged) from 04/12/2022 in [facility]  C-SSRS RISK CATEGORY No Risk       Review of Systems:  Review of Systems  Musculoskeletal:  Negative for gait problem.  Neurological:  Negative for tremors.  Psychiatric/Behavioral:         Please refer to HPI    Medications: I have reviewed the patient's current medications.  Current Outpatient Medications  Medication Sig Dispense Refill   ALPRAZolam (XANAX) 0.25 MG tablet TAKE 1 TABLET BY MOUTH THREE TIMES A DAY AS NEEDED FOR ANXIETY 90 tablet 2   ARIPiprazole (ABILIFY) 5 MG tablet Take 1 tablet (5 mg total) by mouth daily. 30 tablet 2    CALCIUM-MAGNESIUM-ZINC PO Take 1 tablet by mouth at bedtime.     Cholecalciferol (VITAMIN D3) 1000 units CAPS Take 1,000 Units by mouth daily.      HYDROcodone-acetaminophen (NORCO/VICODIN) 5-325 MG tablet Take 1 tablet by mouth every 4 (four) hours as needed for severe pain. 42 tablet 0   losartan (COZAAR) 100 MG tablet Take 100 mg by mouth at bedtime.      Melatonin 10 MG TABS Take 10 mg by mouth at bedtime.     metFORMIN (GLUCOPHAGE-XR) 500 MG 24 hr tablet Take 500 mg by mouth 2 (two) times daily with a meal.     methocarbamol (ROBAXIN) 500 MG tablet Take 1 tablet (500 mg total) by mouth every 6 (six) hours as needed for muscle spasms (muscle pain). 40 tablet 2   Methylcobalamin 1000 MCG LOZG Take 1,000 mcg by mouth every Monday, Wednesday, and Friday.     metoprolol succinate (TOPROL-XL) 25 MG 24 hr tablet Take 25 mg by mouth daily.     Multiple Vitamins-Minerals (PRESERVISION AREDS 2) CAPS Take 1 capsule by mouth 2 (two) times daily.     MYRBETRIQ 50 MG TB24 tablet Take 50 mg by mouth daily.      OLANZapine (ZYPREXA) 2.5 MG tablet TAKE 1 TABLET BY MOUTH AT BEDTIME. 90 tablet 0   omeprazole (PRILOSEC) 20 MG capsule Take 20 mg by mouth at bedtime.      OVER THE COUNTER MEDICATION Apply 1 Application topically daily as needed (pain). CBD cream     polyethylene glycol (MIRALAX / GLYCOLAX) 17 g packet Take 17 g by mouth 2 (  two) times daily. 14 each 0   Probiotic Product (PROBIOTIC PO) Take 1 capsule by mouth daily.     sertraline (ZOLOFT) 100 MG tablet Take 1 tablet (100 mg total) by mouth daily. 90 tablet 1   sertraline (ZOLOFT) 50 MG tablet Take 1 tablet (50 mg total) by mouth daily. 90 tablet 1   simvastatin (ZOCOR) 20 MG tablet Take 20 mg by mouth at bedtime.      zaleplon (SONATA) 5 MG capsule TAKE 1 CAPSULE BY MOUTH EVERY DAY AT BEDTIME AS NEEDED 30 capsule 2   No current facility-administered medications for this visit.    Medication Side Effects: None  Allergies:  Allergies   Allergen Reactions   Hydroxyzine Other (See Comments)    Dizziness    Meloxicam Nausea And Vomiting        Oxycodone-Acetaminophen     Didn't work   Amoxicillin-Pot Clavulanate Diarrhea   Brexpiprazole     Ineffective   Bupropion Hcl Er (Xl)     ineffective   Clonazepam Other (See Comments)    dizziness   Darvon [Propoxyphene] Nausea And Vomiting   Escitalopram Oxalate     ineffective   Fluoxetine     ineffective after 10 yrs   Pollen Extract    Scopolamine Other (See Comments)    Psych. Disturbance ?   Sertraline Hcl     ineffective after 10 yrs   Viibryd [Vilazodone Hcl] Diarrhea    Past Medical History:  Diagnosis Date   Anxiety    Arthritis    some degenerative signs in other places of her body    Complication of anesthesia 1993   used scopolamine patch during surgery- manic episode occurred post op , met with the anesth. dept. afterwards & they thought that the episode was related to scop patch.    Depression    chronic    Diabetes mellitus without complication (HCC)    type II   GERD (gastroesophageal reflux disease)    High cholesterol    Hypertension    Peripheral vascular disease (HCC)    Sleep apnea    CPAP     Past Medical History, Surgical history, Social history, and Family history were reviewed and updated as appropriate.   Please see review of systems for further details on the patient's review from today.   Objective:   Physical Exam:  There were no vitals taken for this visit.  Physical Exam Constitutional:      General: She is not in acute distress. Musculoskeletal:        General: No deformity.  Neurological:     Mental Status: She is alert and oriented to person, place, and time.     Coordination: Coordination normal.  Psychiatric:        Attention and Perception: Attention and perception normal. She does not perceive auditory or visual hallucinations.        Mood and Affect: Affect is not labile, blunt, angry or inappropriate.         Speech: Speech normal.        Behavior: Behavior normal.        Thought Content: Thought content normal. Thought content is not paranoid or delusional. Thought content does not include homicidal or suicidal ideation. Thought content does not include homicidal or suicidal plan.        Cognition and Memory: Cognition and memory normal.        Judgment: Judgment normal.     Comments: Insight intact  Lab Review:     Component Value Date/Time   NA 139 04/13/2022 0335   K 4.0 04/13/2022 0335   CL 108 04/13/2022 0335   CO2 25 04/13/2022 0335   GLUCOSE 166 (H) 04/13/2022 0335   BUN 15 04/13/2022 0335   CREATININE 0.61 04/13/2022 0335   CALCIUM 8.7 (L) 04/13/2022 0335   GFRNONAA >60 04/13/2022 0335   GFRAA >60 05/18/2017 1305       Component Value Date/Time   WBC 12.1 (H) 04/13/2022 0335   RBC 3.52 (L) 04/13/2022 0335   HGB 10.5 (L) 04/13/2022 0335   HCT 32.8 (L) 04/13/2022 0335   PLT 221 04/13/2022 0335   MCV 93.2 04/13/2022 0335   MCH 29.8 04/13/2022 0335   MCHC 32.0 04/13/2022 0335   RDW 13.1 04/13/2022 0335    No results found for: "POCLITH", "LITHIUM"   No results found for: "PHENYTOIN", "PHENOBARB", "VALPROATE", "CBMZ"   .res Assessment: Plan:    Plan:  Zyprexa 2.5mg  daily  Xanax 0.25mg  TID as needed - taking once daily Zoloft 150mg  daily - increased 2 weeks ago Sonata 5mg  at hs.  Seeing therapist - Merry Lofty - last seen over a year ago.  RTC 3 months  Patient advised to contact office with any questions, adverse effects, or acute worsening in signs and symptoms.  There are no diagnoses linked to this encounter.   Please see After Visit Summary for patient specific instructions.  No future appointments.  No orders of the defined types were placed in this encounter.   -------------------------------

## 2023-07-26 DIAGNOSIS — Z0000 Encounter for general adult medical examination without abnormal findings: Secondary | ICD-10-CM | POA: Diagnosis not present

## 2023-07-26 DIAGNOSIS — N3941 Urge incontinence: Secondary | ICD-10-CM | POA: Diagnosis not present

## 2023-07-26 DIAGNOSIS — F332 Major depressive disorder, recurrent severe without psychotic features: Secondary | ICD-10-CM | POA: Diagnosis not present

## 2023-07-31 DIAGNOSIS — M25511 Pain in right shoulder: Secondary | ICD-10-CM | POA: Diagnosis not present

## 2023-08-08 DIAGNOSIS — M25511 Pain in right shoulder: Secondary | ICD-10-CM | POA: Diagnosis not present

## 2023-08-14 DIAGNOSIS — M25511 Pain in right shoulder: Secondary | ICD-10-CM | POA: Diagnosis not present

## 2023-08-23 DIAGNOSIS — M25511 Pain in right shoulder: Secondary | ICD-10-CM | POA: Diagnosis not present

## 2023-08-30 DIAGNOSIS — M25511 Pain in right shoulder: Secondary | ICD-10-CM | POA: Diagnosis not present

## 2023-09-04 DIAGNOSIS — M25511 Pain in right shoulder: Secondary | ICD-10-CM | POA: Diagnosis not present

## 2023-09-08 DIAGNOSIS — M25511 Pain in right shoulder: Secondary | ICD-10-CM | POA: Diagnosis not present

## 2023-09-11 DIAGNOSIS — M25511 Pain in right shoulder: Secondary | ICD-10-CM | POA: Diagnosis not present

## 2023-09-15 DIAGNOSIS — M25511 Pain in right shoulder: Secondary | ICD-10-CM | POA: Diagnosis not present

## 2023-09-18 DIAGNOSIS — M25511 Pain in right shoulder: Secondary | ICD-10-CM | POA: Diagnosis not present

## 2023-09-20 ENCOUNTER — Telehealth: Payer: Self-pay | Admitting: Adult Health

## 2023-09-20 NOTE — Telephone Encounter (Signed)
 PT called and LM around 3pm. She has been experiencing increased depression. No appetite, no motivation, no energy. Wants to discuss increasing her Zoloft  or other alternative.

## 2023-09-20 NOTE — Telephone Encounter (Signed)
 Pt notified about increasing olanzapine . Asked her to touch base with us  next Wednesday and let us  know how she was doing. Can send in new Rx when/if needed.

## 2023-09-20 NOTE — Telephone Encounter (Signed)
 Last seen 3/18.  Zyprexa  2.5mg  daily   Xanax  0.25mg  TID as needed - taking once daily Zoloft  150mg  daily - increased 2 weeks ago Sonata  5mg  at hs.  She reported some improvement with the above, but still struggling. No new stressors. Fleeting SI, no plan. She struggles to get to sleep, but sleeps 9-10 hours once she gets to sleep.  No motivation. Poor hygiene, poor ADLs. Next appt 9/16. Has reported the same in the past.

## 2023-09-27 DIAGNOSIS — Z96611 Presence of right artificial shoulder joint: Secondary | ICD-10-CM | POA: Diagnosis not present

## 2023-09-27 DIAGNOSIS — Z5189 Encounter for other specified aftercare: Secondary | ICD-10-CM | POA: Diagnosis not present

## 2023-09-27 NOTE — Telephone Encounter (Signed)
 Called patient to FU on how she is doing with the increased dose of olanzapine . It has only been a week, but she feels like it is working.

## 2023-10-04 DIAGNOSIS — F332 Major depressive disorder, recurrent severe without psychotic features: Secondary | ICD-10-CM | POA: Diagnosis not present

## 2023-10-04 DIAGNOSIS — E1159 Type 2 diabetes mellitus with other circulatory complications: Secondary | ICD-10-CM | POA: Diagnosis not present

## 2023-10-04 DIAGNOSIS — Z6832 Body mass index (BMI) 32.0-32.9, adult: Secondary | ICD-10-CM | POA: Diagnosis not present

## 2023-10-04 DIAGNOSIS — I10 Essential (primary) hypertension: Secondary | ICD-10-CM | POA: Diagnosis not present

## 2023-10-04 DIAGNOSIS — E785 Hyperlipidemia, unspecified: Secondary | ICD-10-CM | POA: Diagnosis not present

## 2023-10-04 DIAGNOSIS — K219 Gastro-esophageal reflux disease without esophagitis: Secondary | ICD-10-CM | POA: Diagnosis not present

## 2023-10-04 DIAGNOSIS — G4733 Obstructive sleep apnea (adult) (pediatric): Secondary | ICD-10-CM | POA: Diagnosis not present

## 2023-10-04 DIAGNOSIS — N3941 Urge incontinence: Secondary | ICD-10-CM | POA: Diagnosis not present

## 2023-10-04 DIAGNOSIS — M85851 Other specified disorders of bone density and structure, right thigh: Secondary | ICD-10-CM | POA: Diagnosis not present

## 2023-10-06 DIAGNOSIS — M25511 Pain in right shoulder: Secondary | ICD-10-CM | POA: Diagnosis not present

## 2023-10-09 DIAGNOSIS — Z961 Presence of intraocular lens: Secondary | ICD-10-CM | POA: Diagnosis not present

## 2023-10-09 DIAGNOSIS — H04123 Dry eye syndrome of bilateral lacrimal glands: Secondary | ICD-10-CM | POA: Diagnosis not present

## 2023-10-13 ENCOUNTER — Telehealth: Payer: Self-pay | Admitting: Adult Health

## 2023-10-13 MED ORDER — OLANZAPINE 5 MG PO TABS: 5.0000 mg | ORAL_TABLET | Freq: Every day | ORAL | 2 refills | Status: DC

## 2023-10-13 NOTE — Telephone Encounter (Signed)
 Pt called stating she needs a refill on Zyprexa . Said she was increased to 5mg .    [id number] - [place], [place] - [address]

## 2023-10-17 DIAGNOSIS — L821 Other seborrheic keratosis: Secondary | ICD-10-CM | POA: Diagnosis not present

## 2023-10-17 DIAGNOSIS — L814 Other melanin hyperpigmentation: Secondary | ICD-10-CM | POA: Diagnosis not present

## 2023-10-17 DIAGNOSIS — D1801 Hemangioma of skin and subcutaneous tissue: Secondary | ICD-10-CM | POA: Diagnosis not present

## 2023-10-17 DIAGNOSIS — L570 Actinic keratosis: Secondary | ICD-10-CM | POA: Diagnosis not present

## 2023-10-17 DIAGNOSIS — L565 Disseminated superficial actinic porokeratosis (DSAP): Secondary | ICD-10-CM | POA: Diagnosis not present

## 2023-10-17 DIAGNOSIS — Z85828 Personal history of other malignant neoplasm of skin: Secondary | ICD-10-CM | POA: Diagnosis not present

## 2023-10-23 ENCOUNTER — Other Ambulatory Visit: Payer: Self-pay | Admitting: Adult Health

## 2023-10-23 DIAGNOSIS — G4700 Insomnia, unspecified: Secondary | ICD-10-CM

## 2023-11-08 ENCOUNTER — Other Ambulatory Visit: Payer: Self-pay | Admitting: Adult Health

## 2023-12-05 DIAGNOSIS — Z87440 Personal history of urinary (tract) infections: Secondary | ICD-10-CM | POA: Diagnosis not present

## 2023-12-05 DIAGNOSIS — R350 Frequency of micturition: Secondary | ICD-10-CM | POA: Diagnosis not present

## 2023-12-05 DIAGNOSIS — N3941 Urge incontinence: Secondary | ICD-10-CM | POA: Diagnosis not present

## 2024-01-16 ENCOUNTER — Ambulatory Visit (INDEPENDENT_AMBULATORY_CARE_PROVIDER_SITE_OTHER): Admitting: Adult Health

## 2024-01-16 ENCOUNTER — Encounter: Payer: Self-pay | Admitting: Adult Health

## 2024-01-16 DIAGNOSIS — F411 Generalized anxiety disorder: Secondary | ICD-10-CM | POA: Diagnosis not present

## 2024-01-16 DIAGNOSIS — F331 Major depressive disorder, recurrent, moderate: Secondary | ICD-10-CM | POA: Diagnosis not present

## 2024-01-16 DIAGNOSIS — F99 Mental disorder, not otherwise specified: Secondary | ICD-10-CM | POA: Diagnosis not present

## 2024-01-16 DIAGNOSIS — F5105 Insomnia due to other mental disorder: Secondary | ICD-10-CM | POA: Diagnosis not present

## 2024-01-16 DIAGNOSIS — G4700 Insomnia, unspecified: Secondary | ICD-10-CM | POA: Diagnosis not present

## 2024-01-16 MED ORDER — SERTRALINE HCL 100 MG PO TABS: ORAL_TABLET | ORAL | 1 refills | Status: AC

## 2024-01-16 MED ORDER — ZALEPLON 5 MG PO CAPS: ORAL_CAPSULE | ORAL | 2 refills | Status: DC

## 2024-01-16 MED ORDER — ALPRAZOLAM 0.25 MG PO TABS: ORAL_TABLET | ORAL | 2 refills | Status: DC

## 2024-01-16 NOTE — Progress Notes (Signed)
 Stacey Santana [id number] Jan 31, 1946 78 y.o.  Subjective:   Patient ID:  Stacey Santana is a 78 y.o. (DOB 03/02/1946) female.  Chief Complaint: No chief complaint on file.   HPI Stacey Santana presents to the office today for follow-up of GAD, insomnia and MDD.   Describes mood today as ok. Pleasant. Denies tearfulness. Mood symptoms - denies depression - feels lonely and lonesome. She has trouble initiating things but will do things when asked. Denies anxiety. Reports irritability - things are irking me - state of the [place]. Reports lower interest, but is motivated to do things if asked. Denies panic attacks. Reports some worry, rumination, and over thinking - more catastrophic thoughts. Reports mood is lower. Stating I feel like I'm kinda blah. Would like to try and decrease the Zyprexa  to 2.5mg  at bedtime. Taking medications as prescribed.  Energy levels are a lower. Active, has not been exercising with recent surgery.  Enjoys some usual interests and activities. Lives alone. Legally separated. Spending time with family. Appetite decreased. Weight stable - 184 pounds. Sleeps well most nights. Averages 9 to 10 hours. Denies daytime napping. Using CPAP machine. Focus and concentration stable. Completing tasks. Managing some aspects of household. Retired. Denies SI or HI.  Denies AH or VH. Denies substance use. Denies self harm.   Previous medication trials: Prozac - 10 years+, Zoloft  - 10 years+, Depakote, Lamictal Clonazepam, Xanax , Effexor  15 years, Sonata , Wellbutrin , Rexulti , Abilify    Flowsheet Row Admission (Discharged) from 04/12/2022 in [facility]  C-SSRS RISK CATEGORY No Risk     Review of Systems:  Review of Systems  Musculoskeletal:  Negative for gait problem.  Neurological:  Negative for tremors.  Psychiatric/Behavioral:         Please refer to HPI    Medications: I have reviewed the patient's current medications.  Current Outpatient  Medications  Medication Sig Dispense Refill   ALPRAZolam  (XANAX ) 0.25 MG tablet TAKE 1 TABLET BY MOUTH THREE TIMES A DAY AS NEEDED FOR ANXIETY 90 tablet 2   CALCIUM-MAGNESIUM-ZINC PO Take 1 tablet by mouth at bedtime.     Cholecalciferol (VITAMIN D3) 1000 units CAPS Take 1,000 Units by mouth daily.      HYDROcodone -acetaminophen  (NORCO/VICODIN) 5-325 MG tablet Take 1 tablet by mouth every 4 (four) hours as needed for severe pain. 42 tablet 0   losartan  (COZAAR ) 100 MG tablet Take 100 mg by mouth at bedtime.      Melatonin 10 MG TABS Take 10 mg by mouth at bedtime.     metFORMIN  (GLUCOPHAGE -XR) 500 MG 24 hr tablet Take 500 mg by mouth 2 (two) times daily with a meal.     methocarbamol  (ROBAXIN ) 500 MG tablet Take 1 tablet (500 mg total) by mouth every 6 (six) hours as needed for muscle spasms (muscle pain). 40 tablet 2   Methylcobalamin 1000 MCG LOZG Take 1,000 mcg by mouth every Monday, Wednesday, and Friday.     metoprolol  succinate (TOPROL -XL) 25 MG 24 hr tablet Take 25 mg by mouth daily.     Multiple Vitamins-Minerals (PRESERVISION AREDS 2) CAPS Take 1 capsule by mouth 2 (two) times daily.     MYRBETRIQ  50 MG TB24 tablet Take 50 mg by mouth daily.      OLANZapine  (ZYPREXA ) 5 MG tablet TAKE 1 TABLET BY MOUTH EVERY DAY AT BEDTIME 90 tablet 0   omeprazole (PRILOSEC) 20 MG capsule Take 20 mg by mouth at bedtime.      OVER THE COUNTER MEDICATION Apply 1  Application topically daily as needed (pain). CBD cream     polyethylene glycol (MIRALAX  / GLYCOLAX ) 17 g packet Take 17 g by mouth 2 (two) times daily. 14 each 0   Probiotic Product (PROBIOTIC PO) Take 1 capsule by mouth daily.     sertraline  (ZOLOFT ) 100 MG tablet Take 1 tablet (100 mg total) by mouth daily. 90 tablet 1   sertraline  (ZOLOFT ) 50 MG tablet Take 1 tablet (50 mg total) by mouth daily. 90 tablet 1   simvastatin  (ZOCOR ) 20 MG tablet Take 20 mg by mouth at bedtime.      zaleplon  (SONATA ) 5 MG capsule TAKE 1 CAPSULE BY MOUTH EVERY DAY  AT BEDTIME AS NEEDED 30 capsule 2   No current facility-administered medications for this visit.    Medication Side Effects: None  Allergies:  Allergies  Allergen Reactions   Hydroxyzine Other (See Comments)    Dizziness    Meloxicam  Nausea And Vomiting        Oxycodone -Acetaminophen      Didn't work   Amoxicillin-Pot Clavulanate Diarrhea   Brexpiprazole      Ineffective   Bupropion  Hcl Er (Xl)     ineffective   Clonazepam Other (See Comments)    dizziness   Darvon [Propoxyphene] Nausea And Vomiting   Escitalopram  Oxalate     ineffective   Fluoxetine     ineffective after 10 yrs   Pollen Extract    Scopolamine Other (See Comments)    Psych. Disturbance ?   Sertraline  Hcl     ineffective after 10 yrs   Viibryd  [Vilazodone  Hcl] Diarrhea    Past Medical History:  Diagnosis Date   Anxiety    Arthritis    some degenerative signs in other places of her body    Complication of anesthesia 1993   used scopolamine patch during surgery- manic episode occurred post op , met with the anesth. dept. afterwards & they thought that the episode was related to scop patch.    Depression    chronic    Diabetes mellitus without complication (HCC)    type II   GERD (gastroesophageal reflux disease)    High cholesterol    Hypertension    Peripheral vascular disease (HCC)    Sleep apnea    CPAP     Past Medical History, Surgical history, Social history, and Family history were reviewed and updated as appropriate.   Please see review of systems for further details on the patient's review from today.   Objective:   Physical Exam:  There were no vitals taken for this visit.  Physical Exam Constitutional:      General: She is not in acute distress. Musculoskeletal:        General: No deformity.  Neurological:     Mental Status: She is alert and oriented to person, place, and time.     Coordination: Coordination normal.  Psychiatric:        Attention and Perception:  Attention and perception normal. She does not perceive auditory or visual hallucinations.        Mood and Affect: Mood normal. Mood is not anxious or depressed. Affect is not labile, blunt, angry or inappropriate.        Speech: Speech normal.        Behavior: Behavior normal.        Thought Content: Thought content normal. Thought content is not paranoid or delusional. Thought content does not include homicidal or suicidal ideation. Thought content does not include homicidal or  suicidal plan.        Cognition and Memory: Cognition and memory normal.        Judgment: Judgment normal.     Comments: Insight intact     Lab Review:     Component Value Date/Time   NA 139 04/13/2022 0335   K 4.0 04/13/2022 0335   CL 108 04/13/2022 0335   CO2 25 04/13/2022 0335   GLUCOSE 166 (H) 04/13/2022 0335   BUN 15 04/13/2022 0335   CREATININE 0.61 04/13/2022 0335   CALCIUM 8.7 (L) 04/13/2022 0335   GFRNONAA >60 04/13/2022 0335   GFRAA >60 05/18/2017 1305       Component Value Date/Time   WBC 12.1 (H) 04/13/2022 0335   RBC 3.52 (L) 04/13/2022 0335   HGB 10.5 (L) 04/13/2022 0335   HCT 32.8 (L) 04/13/2022 0335   PLT 221 04/13/2022 0335   MCV 93.2 04/13/2022 0335   MCH 29.8 04/13/2022 0335   MCHC 32.0 04/13/2022 0335   RDW 13.1 04/13/2022 0335    No results found for: POCLITH, LITHIUM   No results found for: PHENYTOIN, PHENOBARB, VALPROATE, CBMZ   .res Assessment: Plan:    Plan:  Decrease Zyprexa  5mg  to 2.5mg  daily  Xanax  0.25mg  daily as needed - taking once daily Zoloft  150mg  daily  Sonata  5mg  at hs.  Seeing therapist - Peggye Macintosh - last seen over a year ago.  RTC 2 months  25 minutes spent dedicated to the care of this patient on the date of this encounter to include pre-visit review of records, ordering of medication, post visit documentation, and face-to-face time with the patient discussing depression, anxiety, and insomnia. Discussed continuing current  medication regimen.  Patient advised to contact office with any questions, adverse effects, or acute worsening in signs and symptoms.  There are no diagnoses linked to this encounter.   Please see After Visit Summary for patient specific instructions.  No future appointments.  No orders of the defined types were placed in this encounter.   -------------------------------

## 2024-01-21 ENCOUNTER — Other Ambulatory Visit: Payer: Self-pay | Admitting: Adult Health

## 2024-01-22 DIAGNOSIS — E1159 Type 2 diabetes mellitus with other circulatory complications: Secondary | ICD-10-CM | POA: Diagnosis not present

## 2024-01-22 DIAGNOSIS — E785 Hyperlipidemia, unspecified: Secondary | ICD-10-CM | POA: Diagnosis not present

## 2024-01-29 DIAGNOSIS — F331 Major depressive disorder, recurrent, moderate: Secondary | ICD-10-CM | POA: Diagnosis not present

## 2024-01-29 DIAGNOSIS — N3941 Urge incontinence: Secondary | ICD-10-CM | POA: Diagnosis not present

## 2024-01-29 DIAGNOSIS — G4733 Obstructive sleep apnea (adult) (pediatric): Secondary | ICD-10-CM | POA: Diagnosis not present

## 2024-01-29 DIAGNOSIS — M85851 Other specified disorders of bone density and structure, right thigh: Secondary | ICD-10-CM | POA: Diagnosis not present

## 2024-01-29 DIAGNOSIS — K219 Gastro-esophageal reflux disease without esophagitis: Secondary | ICD-10-CM | POA: Diagnosis not present

## 2024-01-29 DIAGNOSIS — I10 Essential (primary) hypertension: Secondary | ICD-10-CM | POA: Diagnosis not present

## 2024-01-29 DIAGNOSIS — E1159 Type 2 diabetes mellitus with other circulatory complications: Secondary | ICD-10-CM | POA: Diagnosis not present

## 2024-01-29 DIAGNOSIS — Z23 Encounter for immunization: Secondary | ICD-10-CM | POA: Diagnosis not present

## 2024-01-29 DIAGNOSIS — E785 Hyperlipidemia, unspecified: Secondary | ICD-10-CM | POA: Diagnosis not present

## 2024-01-30 DIAGNOSIS — N393 Stress incontinence (female) (male): Secondary | ICD-10-CM | POA: Diagnosis not present

## 2024-01-30 DIAGNOSIS — Z87440 Personal history of urinary (tract) infections: Secondary | ICD-10-CM | POA: Diagnosis not present

## 2024-01-30 DIAGNOSIS — N3281 Overactive bladder: Secondary | ICD-10-CM | POA: Diagnosis not present

## 2024-02-05 ENCOUNTER — Telehealth: Payer: Self-pay | Admitting: Adult Health

## 2024-02-05 NOTE — Telephone Encounter (Signed)
 Pt wants to ask [facility] if she can go up to 200 mg on her Zoloft .   Appt 11/18. On waitlist for in person appt this month.   [facility]

## 2024-02-05 NOTE — Telephone Encounter (Signed)
 Pt last seen 9/16:  Decrease Zyprexa  5mg  to 2.5mg  daily   Xanax  0.25mg  daily as needed - taking once daily Zoloft  150mg  daily  Sonata  5mg  at hs.  She is asking to increase Zoloft  to 200 mg. Reports depression is not being controlled at current dose. She has no interest in doing anything. She does sleep with the current medication. Does report a history of seasonal depression, but said she has been dealing with depression for 50 years. She said with the decrease of Zyprexa  she isn't feeling numb and she has cried. She said she can't decide if this is a good or bad. Reports her anxiety is a little more.  She mentioned Spravato and Auvelity.  I asked her if she had been on Wellbutrin  before and she said she had, but did not have much response to it. Has FU 11/18.

## 2024-02-05 NOTE — Telephone Encounter (Signed)
 Pt notified ok to increase Zoloft . Has enough medication currently to increase it and does not need a RF.

## 2024-02-28 ENCOUNTER — Telehealth: Payer: Self-pay | Admitting: Adult Health

## 2024-02-28 ENCOUNTER — Other Ambulatory Visit: Payer: Self-pay | Admitting: Adult Health

## 2024-02-28 MED ORDER — OLANZAPINE 2.5 MG PO TABS: 2.5000 mg | ORAL_TABLET | Freq: Every day | ORAL | 0 refills | Status: DC

## 2024-02-28 NOTE — Telephone Encounter (Signed)
 Sent Rx for 2.5 with notation to cancel 5 mg.

## 2024-02-28 NOTE — Telephone Encounter (Signed)
 Pt called at 3:16p stating she has a script for Zyprexa  5mg .  She has been cutting them in half.  She would like to have a script for 2.5mg  sent to   [id number] [address], [facility] - [address] [place], [place] [address] Phone: [phone]  Fax: [phone]   Next appt 11/18

## 2024-03-14 ENCOUNTER — Other Ambulatory Visit: Payer: Self-pay | Admitting: Family Medicine

## 2024-03-14 DIAGNOSIS — Z1231 Encounter for screening mammogram for malignant neoplasm of breast: Secondary | ICD-10-CM

## 2024-03-19 ENCOUNTER — Encounter: Payer: Self-pay | Admitting: Adult Health

## 2024-03-19 ENCOUNTER — Ambulatory Visit (INDEPENDENT_AMBULATORY_CARE_PROVIDER_SITE_OTHER): Admitting: Adult Health

## 2024-03-19 DIAGNOSIS — F411 Generalized anxiety disorder: Secondary | ICD-10-CM

## 2024-03-19 DIAGNOSIS — G4700 Insomnia, unspecified: Secondary | ICD-10-CM

## 2024-03-19 DIAGNOSIS — F331 Major depressive disorder, recurrent, moderate: Secondary | ICD-10-CM

## 2024-03-19 MED ORDER — ZALEPLON 10 MG PO CAPS: ORAL_CAPSULE | ORAL | 2 refills | Status: AC

## 2024-03-19 MED ORDER — OLANZAPINE 2.5 MG PO TABS: 2.5000 mg | ORAL_TABLET | Freq: Every day | ORAL | 2 refills | Status: AC

## 2024-03-19 MED ORDER — ALPRAZOLAM 0.25 MG PO TABS: ORAL_TABLET | ORAL | 2 refills | Status: AC

## 2024-03-19 NOTE — Progress Notes (Signed)
 Stacey Santana [id number] [birth year] 78 y.o.  Subjective:   Patient ID:  Stacey Santana is a 78 y.o. (DOB 1945-11-14) female.  Chief Complaint: No chief complaint on file.   HPI Stacey Santana presents to the office today for follow-up of GAD, insomnia and MDD.   Describes mood today as ok. Pleasant. Denies tearfulness. Mood symptoms - denies depression - not like I have been. Reports feeling lonely, but is trying to make plans to get out more. Denies anxiety - a tad now and then. Reports irritability at times. Reports lower interest and motivation - I have to make myself do things - I do what I have to do. Denies panic attacks. Reports some worry, rumination, and over thinking - I've always done that. Reports mood is slightly better. Stating I feel more alive than I did. Feels like the Zyprexa  2.5mg  works well for her. Taking medications as prescribed.  Energy levels are a lower poor. Active, has not been exercising with recent surgery.  Enjoys some usual interests and activities. Lives alone. Legally separated. Spending time with family. Appetite decreased. Weight stable - 184 pounds. Sleeps well most nights. Averages 9 to 10 hours. Denies daytime napping. Using CPAP machine. Focus and concentration stable. Completing tasks. Managing some aspects of household. Retired. Denies SI or HI.  Denies AH or VH. Denies substance use. Denies self harm.   Previous medication trials: Prozac - 10 years+, Zoloft  - 10 years+, Depakote, Lamictal Clonazepam, Xanax , Effexor  15 years, Sonata , Wellbutrin , Rexulti , Abilify    Flowsheet Row Admission (Discharged) from 04/12/2022 in [facility]  C-SSRS RISK CATEGORY No Risk     Review of Systems:  Review of Systems  Musculoskeletal:  Negative for gait problem.  Neurological:  Negative for tremors.  Psychiatric/Behavioral:         Please refer to HPI    Medications: I have reviewed the patient's current  medications.  Current Outpatient Medications  Medication Sig Dispense Refill   ALPRAZolam  (XANAX ) 0.25 MG tablet TAKE 1 TABLET BY MOUTH DAILY AS NEEDED FOR ANXIETY 30 tablet 2   CALCIUM-MAGNESIUM-ZINC PO Take 1 tablet by mouth at bedtime.     Cholecalciferol (VITAMIN D3) 1000 units CAPS Take 1,000 Units by mouth daily.      HYDROcodone -acetaminophen  (NORCO/VICODIN) 5-325 MG tablet Take 1 tablet by mouth every 4 (four) hours as needed for severe pain. 42 tablet 0   losartan  (COZAAR ) 100 MG tablet Take 100 mg by mouth at bedtime.      Melatonin 10 MG TABS Take 10 mg by mouth at bedtime.     metFORMIN  (GLUCOPHAGE -XR) 500 MG 24 hr tablet Take 500 mg by mouth 2 (two) times daily with a meal.     methocarbamol  (ROBAXIN ) 500 MG tablet Take 1 tablet (500 mg total) by mouth every 6 (six) hours as needed for muscle spasms (muscle pain). 40 tablet 2   Methylcobalamin 1000 MCG LOZG Take 1,000 mcg by mouth every Monday, Wednesday, and Friday.     metoprolol  succinate (TOPROL -XL) 25 MG 24 hr tablet Take 25 mg by mouth daily.     Multiple Vitamins-Minerals (PRESERVISION AREDS 2) CAPS Take 1 capsule by mouth 2 (two) times daily.     MYRBETRIQ  50 MG TB24 tablet Take 50 mg by mouth daily.      OLANZapine  (ZYPREXA ) 2.5 MG tablet Take 1 tablet (2.5 mg total) by mouth at bedtime. 30 tablet 0   OLANZapine  (ZYPREXA ) 5 MG tablet TAKE 1 TABLET BY MOUTH EVERY DAY AT  BEDTIME 90 tablet 0   omeprazole (PRILOSEC) 20 MG capsule Take 20 mg by mouth at bedtime.      OVER THE COUNTER MEDICATION Apply 1 Application topically daily as needed (pain). CBD cream     polyethylene glycol (MIRALAX  / GLYCOLAX ) 17 g packet Take 17 g by mouth 2 (two) times daily. 14 each 0   Probiotic Product (PROBIOTIC PO) Take 1 capsule by mouth daily.     sertraline  (ZOLOFT ) 100 MG tablet Take one and 1/2 tablets daily. 135 tablet 1   simvastatin  (ZOCOR ) 20 MG tablet Take 20 mg by mouth at bedtime.      zaleplon  (SONATA ) 5 MG capsule TAKE 1 CAPSULE  BY MOUTH EVERY DAY AT BEDTIME AS NEEDED 30 capsule 2   No current facility-administered medications for this visit.    Medication Side Effects: None  Allergies:  Allergies  Allergen Reactions   Hydroxyzine Other (See Comments)    Dizziness    Meloxicam  Nausea And Vomiting        Oxycodone -Acetaminophen      Didn't work   Amoxicillin-Pot Clavulanate Diarrhea   Brexpiprazole      Ineffective   Bupropion  Hcl Er (Xl)     ineffective   Clonazepam Other (See Comments)    dizziness   Darvon [Propoxyphene] Nausea And Vomiting   Escitalopram  Oxalate     ineffective   Fluoxetine     ineffective after 10 yrs   Pollen Extract    Scopolamine Other (See Comments)    Psych. Disturbance ?   Sertraline  Hcl     ineffective after 10 yrs   Viibryd  [Vilazodone  Hcl] Diarrhea    Past Medical History:  Diagnosis Date   Anxiety    Arthritis    some degenerative signs in other places of her body    Complication of anesthesia 1993   used scopolamine patch during surgery- manic episode occurred post op , met with the anesth. dept. afterwards & they thought that the episode was related to scop patch.    Depression    chronic    Diabetes mellitus without complication (HCC)    type II   GERD (gastroesophageal reflux disease)    High cholesterol    Hypertension    Peripheral vascular disease    Sleep apnea    CPAP     Past Medical History, Surgical history, Social history, and Family history were reviewed and updated as appropriate.   Please see review of systems for further details on the patient's review from today.   Objective:   Physical Exam:  There were no vitals taken for this visit.  Physical Exam Constitutional:      General: She is not in acute distress. Musculoskeletal:        General: No deformity.  Neurological:     Mental Status: She is alert and oriented to person, place, and time.     Coordination: Coordination normal.  Psychiatric:        Attention and  Perception: Attention and perception normal. She does not perceive auditory or visual hallucinations.        Mood and Affect: Mood normal. Mood is not anxious or depressed. Affect is not labile, blunt, angry or inappropriate.        Speech: Speech normal.        Behavior: Behavior normal.        Thought Content: Thought content normal. Thought content is not paranoid or delusional. Thought content does not include homicidal or suicidal ideation. Thought  content does not include homicidal or suicidal plan.        Cognition and Memory: Cognition and memory normal.        Judgment: Judgment normal.     Comments: Insight intact     Lab Review:     Component Value Date/Time   NA 139 04/13/2022 0335   K 4.0 04/13/2022 0335   CL 108 04/13/2022 0335   CO2 25 04/13/2022 0335   GLUCOSE 166 (H) 04/13/2022 0335   BUN 15 04/13/2022 0335   CREATININE 0.61 04/13/2022 0335   CALCIUM 8.7 (L) 04/13/2022 0335   GFRNONAA >60 04/13/2022 0335   GFRAA >60 05/18/2017 1305       Component Value Date/Time   WBC 12.1 (H) 04/13/2022 0335   RBC 3.52 (L) 04/13/2022 0335   HGB 10.5 (L) 04/13/2022 0335   HCT 32.8 (L) 04/13/2022 0335   PLT 221 04/13/2022 0335   MCV 93.2 04/13/2022 0335   MCH 29.8 04/13/2022 0335   MCHC 32.0 04/13/2022 0335   RDW 13.1 04/13/2022 0335    No results found for: POCLITH, LITHIUM   No results found for: PHENYTOIN, PHENOBARB, VALPROATE, CBMZ   .res Assessment: Plan:    Plan:  Zyprexa  2.5mg  daily Xanax  0.25mg  daily as needed - taking once daily Zoloft  150mg  daily   Increase Sonata  5mg  to 10mg  at hs.  Seeing therapist - Peggye Macintosh - last seen over a year ago.  RTC 2 months  25 minutes spent dedicated to the care of this patient on the date of this encounter to include pre-visit review of records, ordering of medication, post visit documentation, and face-to-face time with the patient discussing depression, anxiety, and insomnia. Discussed continuing  current medication regimen.  Patient advised to contact office with any questions, adverse effects, or acute worsening in signs and symptoms.  There are no diagnoses linked to this encounter.   Please see After Visit Summary for patient specific instructions.  Future Appointments  Date Time Provider Department Center  03/19/2024  1:30 PM Monteen Toops Nattalie, NP [facility] None  04/16/2024  1:20 PM [facility]    No orders of the defined types were placed in this encounter.   -------------------------------

## 2024-03-26 ENCOUNTER — Other Ambulatory Visit: Payer: Self-pay | Admitting: Adult Health

## 2024-03-26 NOTE — Telephone Encounter (Signed)
 Refuse, pt is now on 150mg  daily has rx with refills for adjusted dose.

## 2024-04-03 DIAGNOSIS — K219 Gastro-esophageal reflux disease without esophagitis: Secondary | ICD-10-CM | POA: Diagnosis not present

## 2024-04-03 DIAGNOSIS — E1159 Type 2 diabetes mellitus with other circulatory complications: Secondary | ICD-10-CM | POA: Diagnosis not present

## 2024-04-03 DIAGNOSIS — M85851 Other specified disorders of bone density and structure, right thigh: Secondary | ICD-10-CM | POA: Diagnosis not present

## 2024-04-08 DIAGNOSIS — E785 Hyperlipidemia, unspecified: Secondary | ICD-10-CM | POA: Diagnosis not present

## 2024-04-08 DIAGNOSIS — Z6834 Body mass index (BMI) 34.0-34.9, adult: Secondary | ICD-10-CM | POA: Diagnosis not present

## 2024-04-08 DIAGNOSIS — G4733 Obstructive sleep apnea (adult) (pediatric): Secondary | ICD-10-CM | POA: Diagnosis not present

## 2024-04-08 DIAGNOSIS — I10 Essential (primary) hypertension: Secondary | ICD-10-CM | POA: Diagnosis not present

## 2024-04-08 DIAGNOSIS — K219 Gastro-esophageal reflux disease without esophagitis: Secondary | ICD-10-CM | POA: Diagnosis not present

## 2024-04-08 DIAGNOSIS — G8929 Other chronic pain: Secondary | ICD-10-CM | POA: Diagnosis not present

## 2024-04-08 DIAGNOSIS — E1159 Type 2 diabetes mellitus with other circulatory complications: Secondary | ICD-10-CM | POA: Diagnosis not present

## 2024-04-08 DIAGNOSIS — R7989 Other specified abnormal findings of blood chemistry: Secondary | ICD-10-CM | POA: Diagnosis not present

## 2024-04-08 DIAGNOSIS — F331 Major depressive disorder, recurrent, moderate: Secondary | ICD-10-CM | POA: Diagnosis not present

## 2024-04-08 DIAGNOSIS — B372 Candidiasis of skin and nail: Secondary | ICD-10-CM | POA: Diagnosis not present

## 2024-04-08 DIAGNOSIS — M545 Low back pain, unspecified: Secondary | ICD-10-CM | POA: Diagnosis not present

## 2024-04-08 DIAGNOSIS — N3941 Urge incontinence: Secondary | ICD-10-CM | POA: Diagnosis not present

## 2024-04-16 ENCOUNTER — Ambulatory Visit
Admission: RE | Admit: 2024-04-16 | Discharge: 2024-04-16 | Disposition: A | Source: Ambulatory Visit | Attending: Family Medicine | Admitting: Family Medicine

## 2024-04-16 DIAGNOSIS — Z1231 Encounter for screening mammogram for malignant neoplasm of breast: Secondary | ICD-10-CM

## 2024-05-16 ENCOUNTER — Telehealth: Payer: Self-pay | Admitting: Adult Health

## 2024-05-16 NOTE — Telephone Encounter (Signed)
 Pt called to canc 1/16 apt, she's sick. Will call back to RS.  Tell provider doing well.

## 2024-05-17 ENCOUNTER — Ambulatory Visit: Admitting: Adult Health
